# Patient Record
Sex: Male | Born: 1965
Health system: Southern US, Community
[De-identification: ages and names within clinical notes are randomized; demographics above are authoritative.]

## PROBLEM LIST (undated history)

## (undated) DIAGNOSIS — K2270 Barrett's esophagus without dysplasia: Secondary | ICD-10-CM

## (undated) DIAGNOSIS — M542 Cervicalgia: Secondary | ICD-10-CM

## (undated) DIAGNOSIS — F419 Anxiety disorder, unspecified: Secondary | ICD-10-CM

## (undated) DIAGNOSIS — E785 Hyperlipidemia, unspecified: Secondary | ICD-10-CM

## (undated) DIAGNOSIS — R002 Palpitations: Secondary | ICD-10-CM

## (undated) DIAGNOSIS — E039 Hypothyroidism, unspecified: Secondary | ICD-10-CM

## (undated) DIAGNOSIS — D3502 Benign neoplasm of left adrenal gland: Secondary | ICD-10-CM

## (undated) DIAGNOSIS — M069 Rheumatoid arthritis, unspecified: Secondary | ICD-10-CM

## (undated) DIAGNOSIS — N200 Calculus of kidney: Secondary | ICD-10-CM

## (undated) DIAGNOSIS — T7840XA Allergy, unspecified, initial encounter: Secondary | ICD-10-CM

## (undated) DIAGNOSIS — E038 Other specified hypothyroidism: Secondary | ICD-10-CM

## (undated) DIAGNOSIS — R0789 Other chest pain: Secondary | ICD-10-CM

## (undated) DIAGNOSIS — K219 Gastro-esophageal reflux disease without esophagitis: Secondary | ICD-10-CM

## (undated) HISTORY — DX: Rheumatoid arthritis, unspecified: M06.9

## (undated) HISTORY — DX: Benign neoplasm of left adrenal gland: D35.02

## (undated) HISTORY — DX: Hypothyroidism, unspecified: E03.9

## (undated) HISTORY — DX: Palpitations: R00.2

## (undated) HISTORY — DX: Hyperlipidemia, unspecified: E78.5

## (undated) HISTORY — DX: Other chest pain: R07.89

## (undated) HISTORY — DX: Allergy, unspecified, initial encounter: T78.40XA

## (undated) HISTORY — DX: Other specified hypothyroidism: E03.8

## (undated) HISTORY — DX: Anxiety disorder, unspecified: F41.9

## (undated) HISTORY — DX: Calculus of kidney: N20.0

## (undated) HISTORY — DX: Barrett's esophagus without dysplasia: K22.70

## (undated) HISTORY — DX: Gastro-esophageal reflux disease without esophagitis: K21.9

## (undated) HISTORY — PX: CARDIOVASCULAR STRESS TEST: SHX262

## (undated) HISTORY — DX: Cervicalgia: M54.2

---

## 1998-03-15 ENCOUNTER — Emergency Department (HOSPITAL_COMMUNITY): Admission: EM | Admit: 1998-03-15 | Discharge: 1998-03-15 | Payer: Self-pay | Admitting: Emergency Medicine

## 2005-04-13 ENCOUNTER — Ambulatory Visit: Payer: Self-pay | Admitting: Internal Medicine

## 2009-03-11 ENCOUNTER — Encounter: Admission: RE | Admit: 2009-03-11 | Discharge: 2009-03-11 | Payer: Self-pay | Admitting: Neurological Surgery

## 2009-10-16 ENCOUNTER — Encounter: Admission: RE | Admit: 2009-10-16 | Discharge: 2009-10-16 | Payer: Self-pay | Admitting: Neurological Surgery

## 2010-11-02 DIAGNOSIS — K2270 Barrett's esophagus without dysplasia: Secondary | ICD-10-CM

## 2010-11-02 HISTORY — DX: Barrett's esophagus without dysplasia: K22.70

## 2011-08-11 ENCOUNTER — Telehealth: Payer: Self-pay | Admitting: Internal Medicine

## 2011-08-11 NOTE — Telephone Encounter (Signed)
Pt's only visit was 04/13/2005 for burning of throat GERD? lmom for pt to call back.

## 2011-08-11 NOTE — Telephone Encounter (Signed)
Dr Juanda Chance, chart is on your desk for review; OV or Direct EGG? Thanks.

## 2011-08-11 NOTE — Telephone Encounter (Signed)
Pt reports pain at the bottom of his sternum; he stated Dr Juanda Chance stated this was a bone- Xiphoid Process? He reports he is thin and has poor posture and sometimes while he eats he feels as though something is pushing in on his stomach. He take Omeprazole and knows it helps because if he doesn't take it for a few days, he has terrible heartburn. He also feels as though he has a knot in his throat, like stuck food- no pain or trouble eating, he just knows it's there. Pt stated Dr Juanda Chance wanted to do an ENDO, but he didn't do it. Pt would like Dr Regino Schultze input on whether he needs an OV or EGD. Explained to pt Dr Juanda Chance is out of [place] country and I will get the chart and have her review it. If he hasn't heard from [place] by 08/24/11, call back for an update; pt stated understanding.

## 2011-08-11 NOTE — Telephone Encounter (Signed)
OK to schedule direct EGD if he is not on anticoagulants or on dialysis.

## 2011-08-12 NOTE — Telephone Encounter (Signed)
Informed pt it's ok to schedule a Direct EGD if he isn't on dialysis or anti-coags. Pt stated he is only on OTC meds. Pre Visit schedule 08/18/11 at 8am; EGD 09/01/11 at 4pm. Pt stated understanding.

## 2011-08-18 ENCOUNTER — Encounter: Payer: Self-pay | Admitting: Internal Medicine

## 2011-08-18 ENCOUNTER — Ambulatory Visit (AMBULATORY_SURGERY_CENTER): Payer: BC Managed Care – PPO

## 2011-08-18 DIAGNOSIS — R12 Heartburn: Secondary | ICD-10-CM

## 2011-08-18 DIAGNOSIS — R1013 Epigastric pain: Secondary | ICD-10-CM

## 2011-08-18 DIAGNOSIS — R1310 Dysphagia, unspecified: Secondary | ICD-10-CM

## 2011-08-31 ENCOUNTER — Telehealth: Payer: Self-pay | Admitting: Internal Medicine

## 2011-08-31 NOTE — Telephone Encounter (Signed)
Pt. Was concerned that since he had a cold and was coughing if we would still be able to do procedure. Pt afebrile. Instructed pt. That procedure will still be done as long as he feels up to it and he is not having fevers.

## 2011-09-01 ENCOUNTER — Encounter: Payer: Self-pay | Admitting: Internal Medicine

## 2011-09-01 ENCOUNTER — Ambulatory Visit (AMBULATORY_SURGERY_CENTER): Payer: BC Managed Care – PPO | Admitting: Internal Medicine

## 2011-09-01 DIAGNOSIS — R1310 Dysphagia, unspecified: Secondary | ICD-10-CM

## 2011-09-01 DIAGNOSIS — K2270 Barrett's esophagus without dysplasia: Secondary | ICD-10-CM

## 2011-09-01 DIAGNOSIS — K219 Gastro-esophageal reflux disease without esophagitis: Secondary | ICD-10-CM

## 2011-09-01 DIAGNOSIS — R1013 Epigastric pain: Secondary | ICD-10-CM

## 2011-09-01 MED ORDER — OMEPRAZOLE MAGNESIUM 20 MG PO TBEC: 20.0000 mg | DELAYED_RELEASE_TABLET | Freq: Every day | ORAL | Status: DC

## 2011-09-01 MED ORDER — SODIUM CHLORIDE 0.9 % IV SOLN: 500.0000 mL | INTRAVENOUS | Status: DC

## 2011-09-01 NOTE — Patient Instructions (Signed)
FOLLOW YOUR DISCHARGE INSTRUCTIONS ON THE GREEN AND BLUE INSTRUCTION SHEETS.  CONTINUE YOUR MEDICATIONS. NEW PRESCRIPTION SENT ELECTRONICALLY TO YOUR HOSPITAL, PRILOSEC 20 MG DAILY, 30 MINUTES BEFORE BREAKFAST.  AWAIT BIOPSY RESULTS.

## 2011-09-02 ENCOUNTER — Telehealth: Payer: Self-pay | Admitting: *Deleted

## 2011-09-02 NOTE — Telephone Encounter (Signed)

## 2011-09-03 HISTORY — PX: ESOPHAGOGASTRODUODENOSCOPY: SHX1529

## 2011-09-07 ENCOUNTER — Encounter: Payer: Self-pay | Admitting: Internal Medicine

## 2011-09-21 ENCOUNTER — Encounter: Payer: Self-pay | Admitting: *Deleted

## 2011-09-28 ENCOUNTER — Encounter: Payer: Self-pay | Admitting: Family Medicine

## 2011-09-28 ENCOUNTER — Ambulatory Visit (INDEPENDENT_AMBULATORY_CARE_PROVIDER_SITE_OTHER): Payer: BC Managed Care – PPO | Admitting: Family Medicine

## 2011-09-28 ENCOUNTER — Telehealth: Payer: Self-pay | Admitting: Family Medicine

## 2011-09-28 VITALS — BP 125/82 | HR 69 | Temp 97.7°F | Wt 154.0 lb

## 2011-09-28 DIAGNOSIS — Z23 Encounter for immunization: Secondary | ICD-10-CM

## 2011-09-28 DIAGNOSIS — J019 Acute sinusitis, unspecified: Secondary | ICD-10-CM

## 2011-09-28 MED ORDER — AMOXICILLIN 875 MG PO TABS: 875.0000 mg | ORAL_TABLET | Freq: Two times a day (BID) | ORAL | Status: AC

## 2011-09-28 NOTE — Telephone Encounter (Signed)
Pls request records from [facility] in [place].  Thx.  

## 2011-09-28 NOTE — Progress Notes (Signed)
Office Note 10/03/2011  CC:  Chief Complaint  Patient presents with  . Establish Care    sinus infection    HPI:  Tyler Schmidt is a 45 y.o. White male who is here to establish care and discuss respiratory illness. Patient's most recent primary MD: Dr. Lenise Arena at [facility] in [place]. Old records were not reviewed prior to or during today's visit.  Pt presents complaining of respiratory symptoms for approx 20 days.  Mostly nasal congestion/runny nose, sneezing, and PND cough.  Worst symptoms seems to be the pressure in head, sinuses, upper teeth pain.  Lately the symptoms seem to be worsening. No fevers, no wheezing, and no SOB.  ST mild at most.  Symptoms made worse by night time.  Symptoms improved by upright position. Smoker? No (former; quit 1980) Recent sick contact? no Muscle or joint aches? No Flu vaccine yet?  No  ROS: no n/v/d or abdominal pain.  No rash.  No neck stiffness.   +Mild fatigue.  +Mild appetite loss.   Past Medical History  Diagnosis Date  . GERD (gastroesophageal reflux disease)   . Allergy   . Hyperlipidemia     HDL high, LDL high: simvastatin resulted in prolonged/severe muscle pain, led to long w/u and pt wants to avoid further statin unless lipids severely worsen  . Barrett esophagus 2012  No hx of recurrent sinusitis No hx of RAD/asthma  Past Surgical History  Procedure Date  . Esophagogastroduodenoscopy 09/2011    Dr. Juanda Chance  . 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 Deborah Chalk)    Family History  Problem Relation Age of Onset  . Heart disease Father   . Colon cancer Neg Hx     History   Social History  . Marital Status: Married    Spouse Name: N/A    Number of Children: N/A  . Years of Education: N/A   Occupational History  . Not on file.   Social History Main Topics  . Smoking status: Former Smoker    Types: Cigarettes    Quit date: 08/18/1979  . Smokeless tobacco: Former Neurosurgeon      Types: Chew   Comment: Pt uses on and off  . Alcohol Use: No  . Drug Use: No  . Sexually Active: Not on file   Other Topics Concern  . 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  Prudent diet.   MEDS CURRENTLY: omeprazole 20mg  qd, Fish oil, niacin, co-Q10, [facility] Outpatient Encounter Prescriptions as of 09/28/2011  Medication Sig Dispense Refill  . co-enzyme Q-10 30 MG capsule Take 100 mg by mouth daily. Take 2 100 mg daily       . Multiple Vitamin (ONE-A-DAY MENS PO) Take by mouth daily.        . niacin 500 MG tablet Take 500 mg by mouth daily with breakfast.        . Omega-3 Fatty Acids (FISH OIL) 1000 MG CPDR Take by mouth daily.        Marland Kitchen DISCONTD: omeprazole (PRILOSEC OTC) 20 MG tablet Take 1 tablet (20 mg total) by mouth daily.  30 tablet  3  . amoxicillin (AMOXIL) 875 MG tablet Take 1 tablet (875 mg total) by mouth 2 (two) times daily.  20 tablet  0    No Known Allergies  ROS Review of Systems  Constitutional:  Negative for fever and fatigue.  HENT:       See HPI  Eyes: Negative for visual disturbance.  Respiratory:       See HPI  Cardiovascular: Negative for chest pain.  Gastrointestinal: Negative for nausea and abdominal pain.  Genitourinary: Negative for dysuria.  Musculoskeletal: Negative for back pain and joint swelling.  Skin: Negative for rash.  Neurological: Negative for dizziness and weakness.  Hematological: Negative for adenopathy.  Psychiatric/Behavioral: Negative for dysphoric mood.    PE; Blood pressure 125/82, pulse 69, temperature 97.7 F (36.5 C), temperature source Oral, weight 154 lb (69.854 kg), SpO2 97.00%. VS: noted--normal. Gen: alert, NAD, NONTOXIC APPEARING. HEENT: eyes without injection, drainage, or swelling.  Ears: EACs clear, TMs with normal light reflex and landmarks.  Nose: Clear rhinorrhea, with some dried,  crusty exudate adherent to mildly injected mucosa.  No purulent d/c.  No paranasal sinus TTP.  No facial swelling.  Throat and mouth without focal lesion.  No pharyngial swelling, erythema, or exudate.   Neck: supple, no LAD.   LUNGS: CTA bilat, nonlabored resps.   CV: RRR, no m/r/g. EXT: no c/c/e SKIN: no rash   Pertinent labs:  none  ASSESSMENT AND PLAN:   Sinusitis acute Amoxil 875mg  bid x 10d. Nasonex sample given, 2 sprays each nostril qd. Continue claritin D. Gave flu vaccine today IM.     Return if symptoms worsen or fail to improve.

## 2011-09-29 NOTE — Telephone Encounter (Signed)
Faxed 09/28/11

## 2011-09-30 ENCOUNTER — Encounter: Payer: Self-pay | Admitting: Internal Medicine

## 2011-09-30 ENCOUNTER — Telehealth: Payer: Self-pay | Admitting: Internal Medicine

## 2011-09-30 ENCOUNTER — Ambulatory Visit (INDEPENDENT_AMBULATORY_CARE_PROVIDER_SITE_OTHER): Payer: BC Managed Care – PPO | Admitting: Internal Medicine

## 2011-09-30 VITALS — BP 106/70 | HR 84 | Ht 73.0 in | Wt 153.8 lb

## 2011-09-30 DIAGNOSIS — K2270 Barrett's esophagus without dysplasia: Secondary | ICD-10-CM

## 2011-09-30 MED ORDER — OMEPRAZOLE MAGNESIUM 20 MG PO TBEC: 20.0000 mg | DELAYED_RELEASE_TABLET | Freq: Two times a day (BID) | ORAL | Status: DC

## 2011-09-30 NOTE — Progress Notes (Signed)
Tyler Schmidt 1966/10/01 MRN [id number]    History of Present Illness:  This is a 45 year old white male on Prilosec who was just diagnosed with Barrett's esophagus on an upper endoscopy 09/01/2011. He has had a long history of gastroesophageal reflux for which he took Prilosec 20 mg daily. He was experiencing  discomfort and burning in his throat for which we preformed an upper endoscopy. It showed an essentially normal esophagus, stomach and duodenum but the biopsies of the GE junction showed intestinal metaplasia and goblet cells.   Past Medical History  Diagnosis Date  . GERD (gastroesophageal reflux disease)   . Allergy   . Hyperlipidemia   . Barrett esophagus 2012   Past Surgical History  Procedure Date  . Esophagogastroduodenoscopy 09/2011    Dr. Juanda Chance    reports that he quit smoking about 32 years ago. His smoking use included Cigarettes. He has quit using smokeless tobacco. His smokeless tobacco use included Chew. He reports that he does not drink alcohol or use illicit drugs. family history includes Heart disease in his father.  There is no history of Colon cancer. No Known Allergies      Review of Systems: Denies dysphagia chest pain shortness of breath  The remainder of the 10 point ROS is negative except as outlined in H&P    Assessment and Plan:  Problem #1 Chronic gastroesophageal reflux disease with a recent diagnosis of Barrett's esophagus. We will increase his Prilosec to 20 mg twice a day and follow antireflux measures. He will have a repeat upper endoscopy in 2 years. He would like to have a 90 day supply of his Prilosec.   09/30/2011 Lina Sar

## 2011-09-30 NOTE — Telephone Encounter (Signed)
Advised that 20 mg capsules would be okay to use (insurance will not cover otc medicine.)

## 2011-09-30 NOTE — Patient Instructions (Addendum)
We have sent the following medications to your pharmacy for you to pick up at your convenience: Prilosec 20 twice daily You will be due for a recall endoscopy in 08/2013. We will send you a reminder in the mail when it gets closer to that time. DR Silvestre Moment

## 2011-10-03 ENCOUNTER — Encounter: Payer: Self-pay | Admitting: Family Medicine

## 2011-10-03 DIAGNOSIS — J019 Acute sinusitis, unspecified: Secondary | ICD-10-CM | POA: Insufficient documentation

## 2011-10-03 NOTE — Assessment & Plan Note (Signed)
Amoxil 875mg  bid x 10d. Nasonex sample given, 2 sprays each nostril qd. Continue claritin D. Gave flu vaccine today IM.

## 2011-10-25 ENCOUNTER — Encounter: Payer: Self-pay | Admitting: Family Medicine

## 2011-11-03 ENCOUNTER — Encounter: Payer: Self-pay | Admitting: Family Medicine

## 2011-12-18 ENCOUNTER — Encounter: Payer: Self-pay | Admitting: Family Medicine

## 2011-12-18 ENCOUNTER — Ambulatory Visit (INDEPENDENT_AMBULATORY_CARE_PROVIDER_SITE_OTHER): Payer: BC Managed Care – PPO | Admitting: Family Medicine

## 2011-12-18 VITALS — BP 132/76 | HR 69 | Temp 98.4°F | Wt 153.0 lb

## 2011-12-18 DIAGNOSIS — R221 Localized swelling, mass and lump, neck: Secondary | ICD-10-CM

## 2011-12-18 DIAGNOSIS — R220 Localized swelling, mass and lump, head: Secondary | ICD-10-CM

## 2011-12-18 MED ORDER — CEPHALEXIN 500 MG PO CAPS: 500.0000 mg | ORAL_CAPSULE | Freq: Three times a day (TID) | ORAL | Status: AC

## 2011-12-18 NOTE — Progress Notes (Signed)
OFFICE NOTE  12/24/2011  CC:  Chief Complaint  Patient presents with  . Cyst    on left side of neck     HPI: Patient is a 46 y.o. Caucasian male who is here for cyst on left side of neck. For >10 yrs he had noted a swelling on left posterior cervical region of his neck, has always been soft and for the most part unchanged in size.  Over the last week he has noted a slight increase in size and a dull ache in the area.  Tingling sensation in left external ear anatomy and temple during this time as well.     Pertinent PMH:  Past Medical History  Diagnosis Date  . GERD (gastroesophageal reflux disease)   . Allergy   . Hyperlipidemia     HDL high, LDL high: simvastatin resulted in prolonged/severe muscle pain, led to long w/u and pt wants to avoid further statin unless lipids severely worsen  . Barrett esophagus 2012  . Subclinical hypothyroidism     Latest labs 01/2011 (TSH about 5.5, T3 and T4 wnl)   Past surgical, social, and family history reviewed and no changes noted since last office visit. MEDS:  Outpatient Prescriptions Prior to Visit  Medication Sig Dispense Refill  . co-enzyme Q-10 30 MG capsule Take 100 mg by mouth daily. Take 2 100 mg daily       . Multiple Vitamin (ONE-A-DAY MENS PO) Take by mouth daily.        . niacin 500 MG tablet Take 500 mg by mouth daily with breakfast.        . Omega-3 Fatty Acids (FISH OIL) 1000 MG CPDR Take by mouth daily.        Marland Kitchen omeprazole (PRILOSEC OTC) 20 MG tablet Take 1 tablet (20 mg total) by mouth 2 (two) times daily.  180 tablet  2    PE: Blood pressure 132/76, pulse 69, temperature 98.4 F (36.9 C), temperature source Temporal, weight 153 lb (69.4 kg). Gen: Alert, well appearing.  Patient is oriented to person, place, time, and situation. ENT: PERRLA, EOMI, Temples without tenderness or sensation abnormality to palpation.  External ears without erythema or skin changes or warmth.  EAr canals and TMs normal. Left side of neck,  posterior to left SCM muscle bundle shows a barely discernable swelling upon inspection, mainly when he turns head to the right.  Palpation of the area reveals a soft, minimally tender mass that is nonerythematous measuring approx 2.5 cm craniocaudal and 2.0 cm front to back.  The left posterior neck area is mildly nontender diffusely but without any other palpable masses/  IMPRESSION AND PLAN:  Mass of left side of neck Chronic with mld acute worsening. Unclear etiology: ddx is solitary lymph node (possibly infected), cyst (epidermal inclusion vs brachial cleft cyst), neoplasm (lipoma vs lymphoma). With no worrisome signs of malignancy at this time, will treat empirically for infection with keflex 500mg  tid x 10d and see him back for recheck in 2 wks.  If not improved then will proceed with u/s of the area to help determine cystic vs solid nature. Pt's questions were answered and he expressed understanding of the plan.      FOLLOW UP: 2 wks

## 2011-12-24 NOTE — Assessment & Plan Note (Addendum)
Chronic with mld acute worsening. Unclear etiology: ddx is solitary lymph node (possibly infected), cyst (epidermal inclusion vs brachial cleft cyst), neoplasm (lipoma vs lymphoma). With no worrisome signs of malignancy at this time, will treat empirically for infection with keflex 500mg  tid x 10d and see him back for recheck in 2 wks.  If not improved then will proceed with u/s of the area to help determine cystic vs solid nature. Pt's questions were answered and he expressed understanding of the plan.

## 2012-01-04 ENCOUNTER — Ambulatory Visit: Payer: BC Managed Care – PPO | Admitting: Family Medicine

## 2012-01-11 ENCOUNTER — Encounter: Payer: Self-pay | Admitting: Family Medicine

## 2012-01-11 ENCOUNTER — Ambulatory Visit (INDEPENDENT_AMBULATORY_CARE_PROVIDER_SITE_OTHER): Payer: BC Managed Care – PPO | Admitting: Family Medicine

## 2012-01-11 DIAGNOSIS — J019 Acute sinusitis, unspecified: Secondary | ICD-10-CM

## 2012-01-11 DIAGNOSIS — R221 Localized swelling, mass and lump, neck: Secondary | ICD-10-CM

## 2012-01-11 DIAGNOSIS — R220 Localized swelling, mass and lump, head: Secondary | ICD-10-CM

## 2012-01-11 MED ORDER — FLUTICASONE PROPIONATE 50 MCG/ACT NA SUSP: 2.0000 | Freq: Every day | NASAL | Status: DC

## 2012-01-11 NOTE — Assessment & Plan Note (Signed)
Resolved with a course of keflex. He'll start his daily allergic rhinitis tx since spring is coming on: flonase qd rx'd and he'll pick up off-brand allegra to take qd prn.

## 2012-01-11 NOTE — Progress Notes (Signed)
OFFICE NOTE  01/11/2012  CC:  Chief Complaint  Patient presents with  . Follow-up    mass on left side of neck     HPI: Patient is a 46 y.o. Caucasian male who is here for 2 1/2 wk f/u of sinusitis and question of acute change in longstanding left sided neck mass (suspicious for lipoma vs cyst).  He says all sx's resolved after about 3-4 d of abx.  He did take the full course of keflex. No complaints, asks about getting on his allergic rhinitis meds regularly now since it is near spring,his worst season.   Pertinent PMH:  Past Medical History  Diagnosis Date  . GERD (gastroesophageal reflux disease)   . Allergy   . Hyperlipidemia     HDL high, LDL high: simvastatin resulted in prolonged/severe muscle pain, led to long w/u and pt wants to avoid further statin unless lipids severely worsen  . Barrett esophagus 2012  . Subclinical hypothyroidism     Latest labs 01/2011 (TSH about 5.5, T3 and T4 wnl)    MEDS:  Outpatient Prescriptions Prior to Visit  Medication Sig Dispense Refill  . co-enzyme Q-10 30 MG capsule Take 100 mg by mouth daily. Take 2 100 mg daily       . Multiple Vitamin (ONE-A-DAY MENS PO) Take by mouth daily.        . niacin 500 MG tablet Take 500 mg by mouth daily with breakfast.        . Omega-3 Fatty Acids (FISH OIL) 1000 MG CPDR Take by mouth daily.        Marland Kitchen omeprazole (PRILOSEC OTC) 20 MG tablet Take 1 tablet (20 mg total) by mouth 2 (two) times daily.  180 tablet  2    PE: Blood pressure 115/72, pulse 65, temperature 98 F (36.7 C), temperature source Temporal, height 6\' 1"  (1.854 m), weight 156 lb (70.761 kg). Gen: Alert, well appearing.  Patient is oriented to person, place, time, and situation. NECK: left side just posterior to SCM muscle there is a 2-3 cm soft, fatty-feeling sub Q swelling without tenderness or overlying erythema.  Not fixed to surrounding tissues, no induration.  IMPRESSION AND PLAN: Mass of left side of neck Benign features:  continue with watchful waiting approach. Feels better with recent abx.  Sinusitis acute Resolved with a course of keflex. He'll start his daily allergic rhinitis tx since spring is coming on: flonase qd rx'd and he'll pick up off-brand allegra to take qd prn.      FOLLOW UP: prn

## 2012-01-11 NOTE — Assessment & Plan Note (Signed)
Benign features: continue with watchful waiting approach. Feels better with recent abx.

## 2012-03-02 ENCOUNTER — Encounter: Payer: Self-pay | Admitting: Family Medicine

## 2012-03-02 ENCOUNTER — Ambulatory Visit (INDEPENDENT_AMBULATORY_CARE_PROVIDER_SITE_OTHER): Payer: BC Managed Care – PPO | Admitting: Family Medicine

## 2012-03-02 VITALS — BP 124/77 | HR 78 | Temp 97.8°F | Ht 73.0 in | Wt 154.0 lb

## 2012-03-02 DIAGNOSIS — J209 Acute bronchitis, unspecified: Secondary | ICD-10-CM

## 2012-03-02 DIAGNOSIS — E038 Other specified hypothyroidism: Secondary | ICD-10-CM | POA: Insufficient documentation

## 2012-03-02 DIAGNOSIS — E039 Hypothyroidism, unspecified: Secondary | ICD-10-CM | POA: Insufficient documentation

## 2012-03-02 NOTE — Assessment & Plan Note (Signed)
His latest labs are essentially unchanged (01/16/12). Reassured that this is something we'll simply continue to monitor clinically and with labs. Plan to start synthroid IF he becomes clearly symptomatic or if TSH gets to 10 or greater.

## 2012-03-02 NOTE — Assessment & Plan Note (Signed)
Self-limited nature of this illness was discussed, questions answered.  Discussed symptomatic care, rest, fluids.   Warning signs/symptoms of worsening illness were discussed.  Patient instructed to call or return if any of these occur. If not improving any in 5 more days, call or return.

## 2012-03-02 NOTE — Progress Notes (Signed)
OFFICE NOTE  03/02/2012  CC:  Chief Complaint  Patient presents with  . discuss [facility] results    TSH  . head and chest congestion    X 5 days, w/ cough and phlegm (green) in the am and at night it seems to get tight     HPI: Patient is a 46 y.o. Caucasian male who is here for discussion of recent labs obtained through his employer as part of a "peak health" evaluation, plus he has a recent respiratory illness.  Routine lab monitoring 01/16/12 showed no significant change in his mild subclinical hypothyroidism and he remains asymptomatic.  His cholesterol panel is normal for his level of CV risk.  Pt presents complaining of respiratory symptoms for 5 days.  Primary symptoms are: initially nasal congest/runny nose, lately more cough, chest tight.  No wheezing, no SOB.  Subjective fever first day or so, nothing since.  Worst symptoms seems to be the coughing lately -"it's gone to my chest".  Lately the symptoms seem to be staying stable.  Pertinent negatives:No pain in face or teeth.  No significant HA.  ST mild at most--painful when coughing.  Symptoms made worse by nothing.  Symptoms improved by nighttime cold med OTC. Smoker? no Muscle or joint aches? no  Additional ROS: no n/v/d or abdominal pain.  No rash.  No neck stiffness.   +Mild fatigue.  +Mild appetite loss.   Pertinent PMH:  Past Medical History  Diagnosis Date  . GERD (gastroesophageal reflux disease)   . Allergy   . Hyperlipidemia     HDL high, LDL high: simvastatin resulted in prolonged/severe muscle pain, led to long w/u and pt wants to avoid further statin unless lipids severely worsen  . Barrett esophagus 2012  . Subclinical hypothyroidism     Latest labs 01/2011 (TSH about 5.5, T3 and T4 wnl)    MEDS:  Outpatient Prescriptions Prior to Visit  Medication Sig Dispense Refill  . co-enzyme Q-10 30 MG capsule Take 100 mg by mouth daily. Take 2 100 mg daily       . fluticasone (FLONASE) 50 MCG/ACT nasal spray  Place 2 sprays into the nose daily.  16 g  10  . Multiple Vitamin (ONE-A-DAY MENS PO) Take by mouth daily.        . niacin 500 MG tablet Take 500 mg by mouth daily with breakfast.        . Omega-3 Fatty Acids (FISH OIL) 1000 MG CPDR Take by mouth daily.        Marland Kitchen omeprazole (PRILOSEC OTC) 20 MG tablet Take 1 tablet (20 mg total) by mouth 2 (two) times daily.  180 tablet  2    PE: Blood pressure 124/77, pulse 78, temperature 97.8 F (36.6 C), temperature source Temporal, height 6\' 1"  (1.854 m), weight 154 lb (69.854 kg), SpO2 98.00%. VS: noted--normal. Gen: alert, NAD, NONTOXIC APPEARING. HEENT: eyes without injection, drainage, or swelling.  Ears: EACs clear, TMs with normal light reflex and landmarks.  Nose: Clear rhinorrhea, with some dried, crusty exudate adherent to mildly injected mucosa.  No purulent d/c.  No paranasal sinus TTP.  No facial swelling.  Throat and mouth without focal lesion.  No pharyngial swelling, erythema, or exudate.   Neck: supple, no LAD.   LUNGS: CTA bilat, nonlabored resps.   CV: RRR, no m/r/g. EXT: no c/c/e SKIN: no rash    IMPRESSION AND PLAN:  Acute bronchitis Self-limited nature of this illness was discussed, questions answered.  Discussed symptomatic  care, rest, fluids.   Warning signs/symptoms of worsening illness were discussed.  Patient instructed to call or return if any of these occur. If not improving any in 5 more days, call or return.   Subclinical hypothyroidism His latest labs are essentially unchanged (01/16/12). Reassured that this is something we'll simply continue to monitor clinically and with labs. Plan to start synthroid IF he becomes clearly symptomatic or if TSH gets to 10 or greater.      FOLLOW UP: prn

## 2012-07-11 ENCOUNTER — Telehealth: Payer: Self-pay | Admitting: Emergency Medicine

## 2012-07-11 NOTE — Telephone Encounter (Signed)
Dr. Milinda Cave, do you know what testing patient is asking about or do I need to call him for more information?

## 2012-07-12 NOTE — Telephone Encounter (Signed)
Tell him I don't recommend he get this done.  It is a Pharmacologist and the information obtained from it could not only be unreliable, but could lead to unnecessary further testing and worry.  --thx

## 2012-07-12 NOTE — Telephone Encounter (Signed)
Website is [email or web address]  Ultrasound scan for CV disease.  Pt wants to know if it would be any benefit to him.  Unsure if results would be reliable.

## 2012-07-12 NOTE — Telephone Encounter (Signed)
Looking throught the last notes in chart, the only thing I see that he could need as far as labs go is thyroid blood tests to f/u his subclinical hypothyroidism.  Pls call him and get more specifics from him.-thx

## 2012-07-20 ENCOUNTER — Encounter: Payer: Self-pay | Admitting: Family Medicine

## 2012-07-20 ENCOUNTER — Ambulatory Visit (INDEPENDENT_AMBULATORY_CARE_PROVIDER_SITE_OTHER): Payer: BC Managed Care – PPO | Admitting: Family Medicine

## 2012-07-20 VITALS — BP 145/74 | HR 69 | Ht 73.0 in | Wt 151.0 lb

## 2012-07-20 DIAGNOSIS — Z23 Encounter for immunization: Secondary | ICD-10-CM

## 2012-07-20 DIAGNOSIS — R0789 Other chest pain: Secondary | ICD-10-CM | POA: Insufficient documentation

## 2012-07-20 DIAGNOSIS — R079 Chest pain, unspecified: Secondary | ICD-10-CM

## 2012-07-20 DIAGNOSIS — E039 Hypothyroidism, unspecified: Secondary | ICD-10-CM

## 2012-07-20 DIAGNOSIS — E038 Other specified hypothyroidism: Secondary | ICD-10-CM

## 2012-07-20 LAB — CBC WITH DIFFERENTIAL/PLATELET
Basophils Absolute: 0 10*3/uL (ref 0.0–0.1)
HCT: 43.1 % (ref 39.0–52.0)
Lymphs Abs: 2.1 10*3/uL (ref 0.7–4.0)
MCV: 88.1 fl (ref 78.0–100.0)
Monocytes Absolute: 0.4 10*3/uL (ref 0.1–1.0)
Neutrophils Relative %: 52.2 % (ref 43.0–77.0)
Platelets: 233 10*3/uL (ref 150.0–400.0)
RDW: 13.5 % (ref 11.5–14.6)

## 2012-07-20 LAB — T4, FREE: Free T4: 0.9 ng/dL (ref 0.60–1.60)

## 2012-07-20 LAB — TSH: TSH: 2.63 u[IU]/mL (ref 0.35–5.50)

## 2012-07-20 LAB — COMPREHENSIVE METABOLIC PANEL
ALT: 18 U/L (ref 0–53)
AST: 17 U/L (ref 0–37)
Alkaline Phosphatase: 39 U/L (ref 39–117)
Creatinine, Ser: 1.1 mg/dL (ref 0.4–1.5)
Total Bilirubin: 1.1 mg/dL (ref 0.3–1.2)

## 2012-07-20 NOTE — Progress Notes (Signed)
OFFICE NOTE  07/20/2012  CC:  Chief Complaint  Patient presents with  . Shoulder Pain    tinngling in left hand 3 days ago; dull chest pressure     HPI: Patient is a 46 y.o. Caucasian male who is here for left shoulder and hand complaints, chest pressure some lately.  Describes a couple of weeks of episodic dull central chest pressure, 2/10 intensity, on one occasion his left arm hurt some and left hand tingled a bit.  The chest pressure lasts hours and is NOT exercise/activity induced.  He is active: jumps on trampoline and plays kickball and has NO PROBLEMS.  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  ROS: chest congestion with cough lately, productive of slight clear sputum.  No prolonged immobilization, no FH of blood clot, no personal hx of thrombosis.  Has cardiologist appt (already set up as routine f/u) 08/11/2012.  Pertinent PMH:  Past Medical History  Diagnosis Date  . GERD (gastroesophageal reflux disease)   . Allergy   . Hyperlipidemia     HDL high, LDL high: simvastatin resulted in prolonged/severe muscle pain, led to long w/u and pt wants to avoid further statin unless lipids severely worsen  . Barrett esophagus 2012  . Subclinical hypothyroidism     Latest labs 01/2011 (TSH about 5.5, T3 and T4 wnl)    MEDS:  Outpatient Prescriptions Prior to Visit  Medication Sig Dispense Refill  . co-enzyme Q-10 30 MG capsule Take 100 mg by mouth daily. Take 2 100 mg daily       . fluticasone (FLONASE) 50 MCG/ACT nasal spray Place 2 sprays into the nose daily.  16 g  10  . Multiple Vitamin (ONE-A-DAY MENS PO) Take by mouth daily.        . niacin 500 MG tablet Take 500 mg by mouth daily with breakfast.        . Omega-3 Fatty Acids (FISH OIL) 1000 MG CPDR Take by mouth daily.        Marland Kitchen omeprazole (PRILOSEC OTC) 20 MG tablet Take 1 tablet (20 mg total) by mouth 2  (two) times daily.  180 tablet  2    PE: Blood pressure 145/74, pulse 69, height 6\' 1"  (1.854 m), weight 151 lb (68.493 kg). Gen: Alert, well appearing.  Patient is oriented to person, place, time, and situation. AFFECT: pleasant, lucid thought and speech. ENT:   Eyes: no injection, icteris, swelling, or exudate.  EOMI, PERRLA. Nose: no drainage or turbinate edema/swelling.  No injection or focal lesion.  Mouth: lips without lesion/swelling.  Oral mucosa pink and moist.  Dentition intact and without obvious caries or gingival swelling.  Oropharynx without erythema, exudate, or swelling.  Neck - No masses or thyromegaly or limitation in range of motion CV: RRR, no murmur or rub.  Question of split S1 vs S4 gallop heard at apex.  LUNGS: CTA bilat, nonlabored resps, good aeration in all lung fields. ABD: soft, NT/ND EXT: no clubbing, cyanosis, or edema.    LABS: 12 lead EKG today: NSR, normal wave morphologies, normal intervals, normal voltages.  IMPRESSION AND PLAN:  Atypical chest pain Likely musculoskeletal, complicated by excessive life stresses. EKG today reassuring.  Will check D-dimer, CMET, CBC.  Reassured pt.  Encouraged him to keep his upcoming routine cardiology f/u appt 08/11/12.  Subclinical hypothyroidism Repeat thyroid labs today.   Flu shot given  IM today.  FOLLOW UP: 6 mo

## 2012-07-20 NOTE — Assessment & Plan Note (Signed)
Likely musculoskeletal, complicated by excessive life stresses. EKG today reassuring.  Will check D-dimer, CMET, CBC.  Reassured pt.  Encouraged him to keep his upcoming routine cardiology f/u appt 08/11/12.

## 2012-07-20 NOTE — Assessment & Plan Note (Signed)
Repeat thyroid labs today.

## 2012-07-20 NOTE — Telephone Encounter (Signed)
Notified pt.  He is agreeable. 

## 2012-08-02 HISTORY — PX: CARDIOVASCULAR STRESS TEST: SHX262

## 2012-08-11 ENCOUNTER — Ambulatory Visit: Payer: BC Managed Care – PPO | Admitting: Cardiovascular Disease

## 2012-08-12 ENCOUNTER — Telehealth: Payer: Self-pay | Admitting: Family Medicine

## 2012-08-12 ENCOUNTER — Encounter: Payer: Self-pay | Admitting: Family Medicine

## 2012-08-12 ENCOUNTER — Ambulatory Visit (INDEPENDENT_AMBULATORY_CARE_PROVIDER_SITE_OTHER): Payer: BC Managed Care – PPO | Admitting: Family Medicine

## 2012-08-12 VITALS — BP 138/93 | HR 82 | Temp 98.4°F | Ht 73.0 in | Wt 150.8 lb

## 2012-08-12 DIAGNOSIS — IMO0001 Reserved for inherently not codable concepts without codable children: Secondary | ICD-10-CM

## 2012-08-12 DIAGNOSIS — F419 Anxiety disorder, unspecified: Secondary | ICD-10-CM

## 2012-08-12 DIAGNOSIS — K219 Gastro-esophageal reflux disease without esophagitis: Secondary | ICD-10-CM

## 2012-08-12 DIAGNOSIS — F411 Generalized anxiety disorder: Secondary | ICD-10-CM

## 2012-08-12 DIAGNOSIS — R0789 Other chest pain: Secondary | ICD-10-CM

## 2012-08-12 DIAGNOSIS — R079 Chest pain, unspecified: Secondary | ICD-10-CM

## 2012-08-12 DIAGNOSIS — M542 Cervicalgia: Secondary | ICD-10-CM

## 2012-08-12 HISTORY — DX: Cervicalgia: M54.2

## 2012-08-12 HISTORY — DX: Anxiety disorder, unspecified: F41.9

## 2012-08-12 MED ORDER — CYCLOBENZAPRINE HCL 10 MG PO TABS: 10.0000 mg | ORAL_TABLET | Freq: Two times a day (BID) | ORAL | Status: DC | PRN

## 2012-08-12 MED ORDER — NITROGLYCERIN 0.4 MG SL SUBL: 0.4000 mg | SUBLINGUAL_TABLET | SUBLINGUAL | Status: DC | PRN

## 2012-08-12 MED ORDER — LORAZEPAM 0.5 MG PO TABS: 0.5000 mg | ORAL_TABLET | Freq: Two times a day (BID) | ORAL | Status: DC | PRN

## 2012-08-12 MED ORDER — MELOXICAM 15 MG PO TABS: 15.0000 mg | ORAL_TABLET | Freq: Every day | ORAL | Status: DC | PRN

## 2012-08-12 MED ORDER — SUCRALFATE 1 GM/10ML PO SUSP: 1.0000 g | Freq: Two times a day (BID) | ORAL | Status: DC

## 2012-08-12 MED ORDER — ASPIRIN EC 81 MG PO TBEC: 81.0000 mg | DELAYED_RELEASE_TABLET | Freq: Every day | ORAL | Status: DC

## 2012-08-12 NOTE — Telephone Encounter (Signed)
Caller: Tyler Schmidt/Patient; Patient Name: Tyler Schmidt; PCP: Earley Favor [facility]); Best Callback Phone Number: [phone]; Reason for call: Chest Pain/Chest Discomfort. Onset "Intermittent and sporadic" two months ago July 2013. He has an appointment with Cardiologist on October 31st.  Pain occurs with rest and exertion.  Not experiencing any discomfort now.  His last episode was yesterday.  Pain located in Left Chest and Left forearm , and substernal. Described as Dull aching pain, occasional short of breath and episode of dizziness.  Family history of - Cardiac disease.  AAoX3  . Emergent s/sx ruled out per Chest pain protocol with the exception of "One or more occurances of unexplained pain in shoulders neck or jaw in one or both arms, stomach lasting more that a few minutes that has not been evaluated" See provider within 24 housrs.  Appointment today 08/11/12 at 11:15 with Dr. Rogelia Rohrer at office . Home care guidelines and instrucitons reviewed. Caller expressed understanding. Will closely mointor s/sx.

## 2012-08-12 NOTE — Patient Instructions (Addendum)

## 2012-08-14 NOTE — Assessment & Plan Note (Signed)
Given Carafate to use qhs and avoid offending foods. Continue Omeprazole daily

## 2012-08-14 NOTE — Assessment & Plan Note (Signed)
Given a small amount of Lorazepam to use prn and he will return for futher consideration if he needs this frequently or stressors worsen

## 2012-08-14 NOTE — Assessment & Plan Note (Signed)
EKG essentially normal today, discussed case with cardiology today. They are going to work him in next week and he will seek immediate care over the weekend if his pain returns and does not resolve with NTG> He will start an 81 mg Aspirin daily. Avoid strenous activity over the weekend

## 2012-08-14 NOTE — Assessment & Plan Note (Signed)
Has been taking Ibuprofen 600 to 800 mg twice daily including right before bedtime. We will switch him to Meloxicam 15 mg in am with food and give a dose of Cyclobenzaprine qhs. Moist heat and gentle stretching.

## 2012-08-14 NOTE — Progress Notes (Signed)
Patient ID: Tyler Schmidt, male   DOB: 12-31-1965, 46 y.o.   MRN: [id number] Darrell Hauk [id number] [birth year] 08/14/2012      Progress Note-Follow Up  Subjective  Chief Complaint  Chief Complaint  Patient presents with  . chest discomfort    X 2 months off and on- tingling in left upper arm and left hand sometimes, pain sometimes in center of chest and upper left chest- dull pain    HPI  Patient is a 46 year old Caucasian male who is in today for further evaluation of recurrent atypical chest pain. He is scheduled to see cardiology later in the month but is getting more concerned about his episodes. He stands too tired from being a sudden event it is unclear if it was cardiac or perhaps a PE. There was some hemoptysis in the week leading up to his death. He was a smoker. He also has a paternal uncle who died at 72 of sudden cardiac death in 2 to tobacco. Paternal cousin had a cardiac event as well in his 33s. The patient himself is not a smoker. He's been under a great deal of stress lately and is doing a lot of heavy exercise at work. Does believe the pain in his chest and upper left chest wall that radiates down his arm may be musculoskeletal. He has never had shortness of breath, nausea, diaphoresis at the same time he said the chest pain. He does sometimes no palpitations pain he describes as a dull leg. He is having worsening of his reflux. He has been taking ibuprofen up to 800 mg twice daily and heartburn has flared despite his omeprazole use. He is getting more and more anxious and irritable with his family as well.  Past Medical History  Diagnosis Date  . GERD (gastroesophageal reflux disease)   . Allergy   . Hyperlipidemia     HDL high, LDL high: simvastatin resulted in prolonged/severe muscle pain, led to long w/u and pt wants to avoid further statin unless lipids severely worsen  . Barrett esophagus 2012  . Subclinical hypothyroidism     Latest labs 01/2011 (TSH about  5.5, T3 and T4 wnl)  . Neck pain 08/12/2012  . Anxiety 08/12/2012  . Reflux 08/12/2012    Past Surgical History  Procedure Date  . Esophagogastroduodenoscopy 09/2011    Dr. Juanda Chance  . 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 Deborah Chalk)    Family History  Problem Relation Age of Onset  . Heart disease Father     Died of MI age 59  . Colon cancer Neg Hx   . Heart disease Paternal Uncle     MI's in his 52s    History   Social History  . Marital Status: Married    Spouse Name: N/A    Number of Children: N/A  . Years of Education: N/A   Occupational History  . Not on file.   Social History Main Topics  . Smoking status: Former Smoker    Types: Cigarettes    Quit date: 08/18/1979  . Smokeless tobacco: Former Neurosurgeon    Types: Chew   Comment: Pt uses on and off  . Alcohol Use: No  . Drug Use: No  . Sexually Active: Not on file   Other Topics Concern  . 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  Prudent  diet.    Current Outpatient Prescriptions on File Prior to Visit  Medication Sig Dispense Refill  . co-enzyme Q-10 30 MG capsule Take 100 mg by mouth daily. Take 2 100 mg daily       . Multiple Vitamin (ONE-A-DAY MENS PO) Take by mouth daily.        . niacin 500 MG tablet Take 500 mg by mouth daily with breakfast.        . omeprazole (PRILOSEC) 20 MG capsule Take 1 capsule by mouth 2 (two) times daily.      . nitroGLYCERIN (NITROSTAT) 0.4 MG SL tablet Place 1 tablet (0.4 mg total) under the tongue every 5 (five) minutes as needed for chest pain. If no relief after 3 tabs to ER  25 tablet  3  . sucralfate (CARAFATE) 1 GM/10ML suspension Take 10 mLs (1 g total) by mouth 2 (two) times daily. Always at bedtime til pains resolve  420 mL  0    No Known Allergies  Review of Systems  Review of Systems    Constitutional: Negative for fever and malaise/fatigue.  HENT: Positive for neck pain. Negative for congestion.   Eyes: Negative for discharge.  Respiratory: Negative for shortness of breath.   Cardiovascular: Positive for chest pain. Negative for palpitations and leg swelling.  Gastrointestinal: Positive for heartburn. Negative for nausea, abdominal pain and diarrhea.  Genitourinary: Negative for dysuria.  Musculoskeletal: Negative for falls.  Skin: Negative for rash.  Neurological: Negative for loss of consciousness and headaches.  Endo/Heme/Allergies: Negative for polydipsia.  Psychiatric/Behavioral: Negative for depression and suicidal ideas. The patient is nervous/anxious. The patient does not have insomnia.     Objective  BP 138/93  Pulse 82  Temp 98.4 F (36.9 C) (Temporal)  Ht 6\' 1"  (1.854 m)  Wt 150 lb 12.8 oz (68.402 kg)  BMI 19.90 kg/m2  SpO2 97%  Physical Exam  Physical Exam  Constitutional: He is oriented to person, place, and time and well-developed, well-nourished, and in no distress. No distress.  HENT:  Head: Normocephalic and atraumatic.  Eyes: Conjunctivae normal are normal.  Neck: Neck supple. No thyromegaly present.  Cardiovascular: Normal rate, regular rhythm and normal heart sounds.   No murmur heard. Pulmonary/Chest: Effort normal and breath sounds normal. No respiratory distress.  Abdominal: He exhibits no distension and no mass. There is no tenderness.  Musculoskeletal: He exhibits no edema.  Neurological: He is alert and oriented to person, place, and time.  Skin: Skin is warm.  Psychiatric: Memory, affect and judgment normal.    Lab Results  Component Value Date   TSH 2.63 07/20/2012   Lab Results  Component Value Date   WBC 5.5 07/20/2012   HGB 14.3 07/20/2012   HCT 43.1 07/20/2012   MCV 88.1 07/20/2012   PLT 233.0 07/20/2012   Lab Results  Component Value Date   CREATININE 1.1 07/20/2012   BUN 20 07/20/2012   NA 139 07/20/2012   K 4.3  07/20/2012   CL 101 07/20/2012   CO2 30 07/20/2012   Lab Results  Component Value Date   ALT 18 07/20/2012   AST 17 07/20/2012   ALKPHOS 39 07/20/2012   BILITOT 1.1 07/20/2012    Assessment & Plan  Atypical chest pain EKG essentially normal today, discussed case with cardiology today. They are going to work him in next week and he will seek immediate care over the weekend if his pain returns and does not resolve with NTG> He will start an  81 mg Aspirin daily. Avoid strenous activity over the weekend  Neck pain Has been taking Ibuprofen 600 to 800 mg twice daily including right before bedtime. We will switch him to Meloxicam 15 mg in am with food and give a dose of Cyclobenzaprine qhs. Moist heat and gentle stretching.  Reflux Given Carafate to use qhs and avoid offending foods. Continue Omeprazole daily  Anxiety Given a small amount of Lorazepam to use prn and he will return for futher consideration if he needs this frequently or stressors worsen

## 2012-08-16 ENCOUNTER — Encounter: Payer: Self-pay | Admitting: Cardiology

## 2012-08-16 ENCOUNTER — Ambulatory Visit (INDEPENDENT_AMBULATORY_CARE_PROVIDER_SITE_OTHER): Payer: BC Managed Care – PPO | Admitting: Cardiology

## 2012-08-16 VITALS — BP 131/81 | HR 80 | Ht 73.0 in | Wt 149.0 lb

## 2012-08-16 DIAGNOSIS — E038 Other specified hypothyroidism: Secondary | ICD-10-CM

## 2012-08-16 DIAGNOSIS — E039 Hypothyroidism, unspecified: Secondary | ICD-10-CM

## 2012-08-16 DIAGNOSIS — R079 Chest pain, unspecified: Secondary | ICD-10-CM

## 2012-08-16 DIAGNOSIS — IMO0001 Reserved for inherently not codable concepts without codable children: Secondary | ICD-10-CM

## 2012-08-16 DIAGNOSIS — R0789 Other chest pain: Secondary | ICD-10-CM

## 2012-08-16 DIAGNOSIS — K219 Gastro-esophageal reflux disease without esophagitis: Secondary | ICD-10-CM

## 2012-08-16 NOTE — Assessment & Plan Note (Signed)
Continue Prilosec

## 2012-08-16 NOTE — Patient Instructions (Addendum)
Your physician recommends that you schedule a follow-up appointment in: AS NEEDED PENDING TEST RESULTS  Your physician has requested that you have a stress echocardiogram. For further information please visit [email or web address]. Please follow instruction sheet as given.    

## 2012-08-16 NOTE — Assessment & Plan Note (Signed)
Management per primary care. 

## 2012-08-16 NOTE — Assessment & Plan Note (Signed)
Symptoms are atypical. Plan stress echocardiogram for risk stratification. Note recent d-dimer normal.

## 2012-08-16 NOTE — Progress Notes (Signed)
HPI: 46 year old male for evaluation of chest pain. Patient had a stress echocardiogram in April of 2009 that was normal. Recent DDimer 07/20/12 normal. Hgb, renal function normal. Patient describes intermittent chest pain for approximately 2 months. The pain is in the upper substernal area. It lasts several minutes to 2 hours. No associated symptoms. The pain is not pleuritic, positional, related to food or exertional. It resolves spontaneously. It typically does not radiate although he had one episode radiating to his neck. Because of the above we were asked to evaluate. There is dyspnea with more extreme activities but not routine activities. No orthopnea, PND, pedal edema or exertional chest pain.  Current Outpatient Prescriptions  Medication Sig Dispense Refill  . aspirin EC 81 MG tablet Take 1 tablet (81 mg total) by mouth daily.      Marland Kitchen co-enzyme Q-10 30 MG capsule Take 100 mg by mouth daily. Take 2 100 mg daily       . cyclobenzaprine (FLEXERIL) 10 MG tablet Take 1 tablet (10 mg total) by mouth 2 (two) times daily as needed for muscle spasms (can cause sedation, use at bedtime).  30 tablet  0  . LORazepam (ATIVAN) 0.5 MG tablet Take 1 tablet (0.5 mg total) by mouth 2 (two) times daily as needed for anxiety (may cause sedation).  30 tablet  0  . meloxicam (MOBIC) 15 MG tablet Take 1 tablet (15 mg total) by mouth daily as needed for pain (in am with food).  30 tablet  1  . Multiple Vitamin (ONE-A-DAY MENS PO) Take by mouth daily.        . niacin 500 MG tablet Take 500 mg by mouth daily with breakfast.        . nitroGLYCERIN (NITROSTAT) 0.4 MG SL tablet Place 1 tablet (0.4 mg total) under the tongue every 5 (five) minutes as needed for chest pain. If no relief after 3 tabs to ER  25 tablet  3  . omeprazole (PRILOSEC) 20 MG capsule Take 1 capsule by mouth 2 (two) times daily.        No Known Allergies  Past Medical History  Diagnosis Date  . GERD (gastroesophageal reflux disease)   . Allergy    . Hyperlipidemia     HDL high, LDL high: simvastatin resulted in prolonged/severe muscle pain, led to long w/u and pt wants to avoid further statin unless lipids severely worsen  . Barrett esophagus 2012  . Subclinical hypothyroidism     Latest labs 01/2011 (TSH about 5.5, T3 and T4 wnl)  . Anxiety 08/12/2012    Past Surgical History  Procedure Date  . Esophagogastroduodenoscopy 09/2011    Dr. Juanda Chance  . 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 Marital Status: Married    Spouse Name: N/A    Number of Children: 2  . Years of Education: N/A   Occupational History  .      Pilot   Social History Main Topics  . Smoking status: Former Smoker    Types: Cigarettes    Quit date: 08/18/1979  . Smokeless tobacco: Former Neurosurgeon    Types: Chew   Comment: Pt uses on and off  . Alcohol Use: No  . Drug Use: No  . Sexually Active: Not on file   Other Topics Concern  . 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  Prudent diet.    Family History  Problem Relation Age of Onset  . Heart disease Father     Died of MI age 11  . Colon cancer Neg Hx   . Heart disease Paternal Uncle     MI's in his 34s    ROS: no fevers or chills, productive cough, hemoptysis, dysphasia, odynophagia, melena, hematochezia, dysuria, hematuria, rash, seizure activity, orthopnea, PND, pedal edema, claudication. Remaining systems are negative.  Physical Exam:   Blood pressure 131/81, pulse 80, height 6\' 1"  (1.854 m), weight 149 lb (67.586 kg).  General:  Well developed/well nourished in NAD Skin warm/dry Patient not depressed No peripheral clubbing Back-normal HEENT-normal/normal eyelids Neck supple/normal carotid upstroke bilaterally; no bruits; no JVD; no thyromegaly chest - CTA/ normal  expansion CV - RRR/normal S1 and S2; no murmurs, rubs or gallops;  PMI nondisplaced Abdomen -NT/ND, no HSM, no mass, + bowel sounds, no bruit 2+ femoral pulses, no bruits Ext-no edema, chords, 2+ DP Neuro-grossly nonfocal  ECG 08/12/12-Sinus rhythm with RVCD

## 2012-08-30 ENCOUNTER — Ambulatory Visit (HOSPITAL_BASED_OUTPATIENT_CLINIC_OR_DEPARTMENT_OTHER): Payer: BC Managed Care – PPO | Admitting: Radiology

## 2012-08-30 ENCOUNTER — Encounter: Payer: Self-pay | Admitting: Cardiology

## 2012-08-30 ENCOUNTER — Ambulatory Visit (HOSPITAL_COMMUNITY): Payer: BC Managed Care – PPO | Attending: Cardiology

## 2012-08-30 DIAGNOSIS — R072 Precordial pain: Secondary | ICD-10-CM

## 2012-08-30 DIAGNOSIS — R079 Chest pain, unspecified: Secondary | ICD-10-CM

## 2012-08-30 DIAGNOSIS — R0989 Other specified symptoms and signs involving the circulatory and respiratory systems: Secondary | ICD-10-CM

## 2012-08-30 NOTE — Progress Notes (Signed)
Stress Echocardiogram performed.  

## 2012-09-01 ENCOUNTER — Ambulatory Visit: Payer: BC Managed Care – PPO | Admitting: Cardiovascular Disease

## 2012-09-21 ENCOUNTER — Other Ambulatory Visit: Payer: Self-pay | Admitting: *Deleted

## 2012-09-21 MED ORDER — OMEPRAZOLE 20 MG PO CPDR: 20.0000 mg | DELAYED_RELEASE_CAPSULE | Freq: Two times a day (BID) | ORAL | Status: DC

## 2012-11-06 ENCOUNTER — Other Ambulatory Visit: Payer: Self-pay | Admitting: Family Medicine

## 2012-11-07 ENCOUNTER — Encounter: Payer: Self-pay | Admitting: Family Medicine

## 2012-11-07 NOTE — Telephone Encounter (Signed)
eScribe request for refill on MELOXICAM Last filled - 08/12/12, #30 X 1--given in office by Dr. Abner Greenspan for neck pain Last seen on - 08/12/12 Follow up - no appt in system Please advise refills.

## 2012-11-17 ENCOUNTER — Other Ambulatory Visit: Payer: Self-pay | Admitting: Family Medicine

## 2012-12-11 ENCOUNTER — Other Ambulatory Visit: Payer: Self-pay | Admitting: Family Medicine

## 2012-12-14 NOTE — Telephone Encounter (Signed)
eScribe request for refill on MELOXICAM Last filled - 11/07/12, 30 X 3 AND 11/18/12, #30 X 1 BY DR. Abner Greenspan Last seen on - 08/12/12 RX DENIED.

## 2013-01-19 ENCOUNTER — Ambulatory Visit (INDEPENDENT_AMBULATORY_CARE_PROVIDER_SITE_OTHER): Payer: BC Managed Care – PPO | Admitting: Family Medicine

## 2013-01-19 ENCOUNTER — Encounter: Payer: Self-pay | Admitting: Family Medicine

## 2013-01-19 VITALS — BP 142/74 | HR 74 | Temp 98.4°F | Resp 12 | Wt 158.2 lb

## 2013-01-19 DIAGNOSIS — E039 Hypothyroidism, unspecified: Secondary | ICD-10-CM

## 2013-01-19 LAB — T3: T3, Total: 85.3 ng/dL (ref 80.0–204.0)

## 2013-01-19 LAB — T4, FREE: Free T4: 0.75 ng/dL (ref 0.60–1.60)

## 2013-01-19 MED ORDER — MELOXICAM 15 MG PO TABS: 15.0000 mg | ORAL_TABLET | Freq: Every day | ORAL | Status: DC

## 2013-01-19 NOTE — Assessment & Plan Note (Signed)
Repeat TSH here, along with free T4, T3 total, TPO antibodies and anti-thyroglobulin antibodies. Start synthroid after results in if appropriate.

## 2013-01-19 NOTE — Progress Notes (Signed)
OFFICE NOTE  01/19/2013  CC:  Chief Complaint  Patient presents with  . Follow-up    Abnormal labs  . Medication Refill    Meloxicam     HPI: Patient is a 47 y.o. Caucasian male who is here for f/u of abnormal labs at his employer's: TSH up from 6.08 January 2012 to 10.8 this month--no T4 or T3 measured.  His cholesterol numbers trended up a bit as well but he also says he wasn't completely fasting.  He denies fatigue, skin changes, depression, constipation, or any other complaint at this time.  Mobic helps his back a lot, asks for RF.   Pertinent PMH:  Past Medical History  Diagnosis Date  . GERD (gastroesophageal reflux disease)   . Allergy   . Hyperlipidemia     HDL high, LDL high: simvastatin resulted in prolonged/severe muscle pain, led to long w/u and pt wants to avoid further statin unless lipids severely worsen  . Barrett esophagus 2012  . Subclinical hypothyroidism     Latest labs 01/2011 (TSH about 5.5, T3 and T4 wnl)  . Anxiety 08/12/2012  . Atypical chest pain Fall 2013    Stress echo NORMAL 08/30/12   Past family and social history reviewed and there are no changes since the patient's last office visit with me.  MEDS:  Outpatient Prescriptions Prior to Visit  Medication Sig Dispense Refill  . co-enzyme Q-10 30 MG capsule Take 100 mg by mouth daily. Take 2 100 mg daily       . cyclobenzaprine (FLEXERIL) 10 MG tablet Take 1 tablet (10 mg total) by mouth 2 (two) times daily as needed for muscle spasms (can cause sedation, use at bedtime).  30 tablet  0  . LORazepam (ATIVAN) 0.5 MG tablet Take 1 tablet (0.5 mg total) by mouth 2 (two) times daily as needed for anxiety (may cause sedation).  30 tablet  0  . meloxicam (MOBIC) 15 MG tablet TAKE 1 TABLET (15 MG TOTAL) BY MOUTH DAILY AS NEEDED FOR PAIN (IN AM WITH FOOD).  30 tablet  3  . meloxicam (MOBIC) 15 MG tablet TAKE 1 TABLET (15 MG TOTAL) BY MOUTH DAILY AS NEEDED FOR PAIN (IN AM WITH FOOD).  30 tablet  1  . Multiple  Vitamin (ONE-A-DAY MENS PO) Take by mouth daily.        . niacin 500 MG tablet Take 500 mg by mouth daily with breakfast.        . omeprazole (PRILOSEC) 20 MG capsule Take 1 capsule (20 mg total) by mouth 2 (two) times daily.  180 capsule  1  . aspirin EC 81 MG tablet Take 1 tablet (81 mg total) by mouth daily.      . nitroGLYCERIN (NITROSTAT) 0.4 MG SL tablet Place 1 tablet (0.4 mg total) under the tongue every 5 (five) minutes as needed for chest pain. If no relief after 3 tabs to ER  25 tablet  3   No facility-administered medications prior to visit.    PE: Blood pressure 142/74, pulse 74, temperature 98.4 F (36.9 C), temperature source Temporal, resp. rate 12, weight 158 lb 4 oz (71.782 kg), SpO2 97.00%. Gen: Alert, well appearing.  Patient is oriented to person, place, time, and situation. AFFECT: pleasant, lucid thought and speech. ENT: Eyes: no injection, icteris, swelling, or exudate.  EOMI, PERRLA. Nose: no drainage or turbinate edema/swelling.  No injection or focal lesion.  Mouth: lips without lesion/swelling.  Oral mucosa pink and moist.  Dentition intact  and without obvious caries or gingival swelling.  Oropharynx without erythema, exudate, or swelling.  Neck - No masses or thyromegaly or limitation in range of motion CV: RRR, no m/r/g.   LUNGS: CTA bilat, nonlabored resps, good aeration in all lung fields. Neuro: no tremor   IMPRESSION AND PLAN:  Hypothyroidism Repeat TSH here, along with free T4, T3 total, TPO antibodies and anti-thyroglobulin antibodies. Start synthroid after results in if appropriate.  Mild mixed hyperlipidemia: he has had bad myalgias with statin trial in the past.  Some of his changes could be associated with his mild hypothyroidism.  At any rate, emphasized TLC.  No cholesterol meds at this time.  An After Visit Summary was printed and given to the patient.  FOLLOW UP: 37mo

## 2013-01-20 LAB — THYROGLOBULIN LEVEL: Thyroglobulin: 29.9 ng/mL (ref 0.0–55.0)

## 2013-01-20 LAB — THYROID PEROXIDASE ANTIBODY: Thyroperoxidase Ab SerPl-aCnc: 93.6 IU/mL — ABNORMAL HIGH (ref <35.0)

## 2013-03-07 ENCOUNTER — Other Ambulatory Visit: Payer: BC Managed Care – PPO

## 2013-03-08 ENCOUNTER — Other Ambulatory Visit (INDEPENDENT_AMBULATORY_CARE_PROVIDER_SITE_OTHER): Payer: BC Managed Care – PPO

## 2013-03-08 DIAGNOSIS — E039 Hypothyroidism, unspecified: Secondary | ICD-10-CM

## 2013-03-08 LAB — T4, FREE: Free T4: 0.84 ng/dL (ref 0.60–1.60)

## 2013-03-08 LAB — LIPID PANEL
HDL: 47.8 mg/dL (ref >39.00)
VLDL: 14.4 mg/dL (ref 0.0–40.0)

## 2013-03-08 LAB — TSH: TSH: 4.63 u[IU]/mL (ref 0.35–5.50)

## 2013-03-08 NOTE — Progress Notes (Signed)
Labs only

## 2013-03-09 LAB — LDL CHOLESTEROL, DIRECT: Direct LDL: 155.1 mg/dL

## 2013-03-20 ENCOUNTER — Ambulatory Visit: Payer: BC Managed Care – PPO | Admitting: Family Medicine

## 2013-07-18 ENCOUNTER — Encounter: Payer: Self-pay | Admitting: Internal Medicine

## 2013-08-14 ENCOUNTER — Telehealth: Payer: Self-pay | Admitting: Internal Medicine

## 2013-08-14 NOTE — Telephone Encounter (Signed)
I have not spoken with the pt yet. Left a message for him to call me. Barrett's Esophagus found on 10/310/12 and Dr Juanda Chance wrote of a Recall EGD in 2 years; now.  His call states he is feeling great and wants to wait another year before repeating the EGD. If asymptomatic, what do you advise? Thanks.

## 2013-08-14 NOTE — Telephone Encounter (Signed)
OK to wait another year.

## 2013-08-15 NOTE — Telephone Encounter (Signed)
Changed recall to 08/2014 and informed pt of Dr Regino Schultze decision; he will call if he has any problems.

## 2013-09-07 ENCOUNTER — Other Ambulatory Visit: Payer: Self-pay

## 2013-11-08 ENCOUNTER — Other Ambulatory Visit: Payer: Self-pay | Admitting: Internal Medicine

## 2013-11-15 ENCOUNTER — Other Ambulatory Visit: Payer: Self-pay | Admitting: Internal Medicine

## 2013-11-15 ENCOUNTER — Telehealth: Payer: Self-pay | Admitting: Internal Medicine

## 2013-11-15 NOTE — Telephone Encounter (Signed)
The only "agreement" made was that he wait 1 more year for endoscopy. We still need to see him in the office every 2 years.

## 2013-11-17 ENCOUNTER — Telehealth: Payer: Self-pay | Admitting: Family Medicine

## 2013-11-17 ENCOUNTER — Ambulatory Visit: Payer: BC Managed Care – PPO | Admitting: Nurse Practitioner

## 2013-11-17 MED ORDER — OMEPRAZOLE 20 MG PO CPDR: 20.0000 mg | DELAYED_RELEASE_CAPSULE | Freq: Two times a day (BID) | ORAL | Status: DC

## 2013-11-17 NOTE — Telephone Encounter (Signed)
Patient called requesting omeprazole refill for 90 day supply.  I told him I would send it into [facility] but he had to make an apt and keep the apt for any further refills.   Patient aware and stated he would be in next Monday at 9:30.

## 2013-11-20 ENCOUNTER — Ambulatory Visit: Payer: BC Managed Care – PPO | Admitting: Family Medicine

## 2013-11-22 ENCOUNTER — Encounter: Payer: Self-pay | Admitting: Family Medicine

## 2013-11-22 ENCOUNTER — Ambulatory Visit: Payer: BC Managed Care – PPO | Admitting: Family Medicine

## 2013-11-22 ENCOUNTER — Ambulatory Visit (INDEPENDENT_AMBULATORY_CARE_PROVIDER_SITE_OTHER): Payer: BC Managed Care – PPO | Admitting: Family Medicine

## 2013-11-22 VITALS — BP 125/79 | HR 58 | Temp 97.8°F | Resp 16 | Ht 73.0 in | Wt 155.0 lb

## 2013-11-22 DIAGNOSIS — E039 Hypothyroidism, unspecified: Secondary | ICD-10-CM

## 2013-11-22 DIAGNOSIS — K219 Gastro-esophageal reflux disease without esophagitis: Secondary | ICD-10-CM

## 2013-11-22 DIAGNOSIS — E038 Other specified hypothyroidism: Secondary | ICD-10-CM

## 2013-11-22 DIAGNOSIS — E785 Hyperlipidemia, unspecified: Secondary | ICD-10-CM

## 2013-11-22 LAB — T4, FREE: FREE T4: 0.81 ng/dL (ref 0.60–1.60)

## 2013-11-22 LAB — LDL CHOLESTEROL, DIRECT: LDL DIRECT: 160.2 mg/dL

## 2013-11-22 LAB — LIPID PANEL
Cholesterol: 221 mg/dL — ABNORMAL HIGH (ref 0–200)
HDL: 56.1 mg/dL (ref >39.00)
TRIGLYCERIDES: 61 mg/dL (ref 0.0–149.0)
Total CHOL/HDL Ratio: 4
VLDL: 12.2 mg/dL (ref 0.0–40.0)

## 2013-11-22 LAB — TSH: TSH: 4 u[IU]/mL (ref 0.35–5.50)

## 2013-11-22 NOTE — Progress Notes (Signed)
Office Note 11/30/2013  CC:  Chief Complaint  Patient presents with  . Hypothyroidism    follow up    HPI:  Tyler Schmidt is a 48 y.o. White male who is here for 1 yr f/u for subclinical hypothyroidism, GERD. Has run out of omeprazole.  Takes omeprazole daily.  When off of this med he does have signif sx's but admits to dietary indiscretion. Running for exercise.  Good energy levels, good appetite, bowels moving normally, no skin or hair changes.  He is fasting today in prep for chol recheck--has been eating a heart healthy diet.  Past Medical History  Diagnosis Date  . GERD (gastroesophageal reflux disease)   . Allergy   . Hyperlipidemia     HDL high, LDL high: simvastatin resulted in prolonged/severe muscle pain, led to long w/u and pt wants to avoid further statin unless lipids severely worsen  . Barrett esophagus 2012  . Subclinical hypothyroidism     Patient give hx of hypfunctioning goiter in the distant past, says he was given pills to "kill" the goiter.  Says bx of goiter was benign.  . Anxiety 08/12/2012  . Atypical chest pain Fall 2013    Stress echo NORMAL 08/30/12    Past Surgical History  Procedure Laterality Date  . Esophagogastroduodenoscopy  09/2011    Dr. Olevia Perches  . 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 Doreatha Lew)  . Cardiovascular stress test  08/2012    Stress echo normal    Family History  Problem Relation Age of Onset  . Heart disease Father     Died of MI age 9  . Colon cancer Neg Hx   . Heart disease Paternal Uncle     MI's in his 40s    History   Social History  . Marital Status: Married    Spouse Name: N/A    Number of Children: 2  . Years of Education: N/A   Occupational History  .      Pilot   Social History Main Topics  . Smoking status: Former Smoker    Types: Cigarettes    Quit date: 08/18/1979  . Smokeless tobacco: Former Systems developer    Types: Chew     Comment: Pt uses on  and off  . Alcohol Use: No  . Drug Use: No  . Sexual Activity: Not on file   Other Topics Concern  . Not on file   Social History Narrative   Married, boy/girl twins age 76yrs.   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   Originally from [place].   No T/A/Ds.   Cardiovasc exercise regularly.  Prudent diet.          Outpatient Prescriptions Prior to Visit  Medication Sig Dispense Refill  . co-enzyme Q-10 30 MG capsule Take 100 mg by mouth daily. Take 2 100 mg daily       . Krill Oil CAPS Take 1 capsule by mouth.      . Multiple Vitamin (ONE-A-DAY MENS PO) Take by mouth daily.        . niacin 500 MG tablet Take 500 mg by mouth daily with breakfast.        . omeprazole (PRILOSEC) 20 MG capsule Take 1 capsule (20 mg total) by mouth 2 (two) times daily.  180 capsule  0  . aspirin EC 81 MG tablet Take 1 tablet (81 mg total) by mouth daily.      Marland Kitchen  cyclobenzaprine (FLEXERIL) 10 MG tablet Take 1 tablet (10 mg total) by mouth 2 (two) times daily as needed for muscle spasms (can cause sedation, use at bedtime).  30 tablet  0  . LORazepam (ATIVAN) 0.5 MG tablet Take 1 tablet (0.5 mg total) by mouth 2 (two) times daily as needed for anxiety (may cause sedation).  30 tablet  0  . meloxicam (MOBIC) 15 MG tablet Take 1 tablet (15 mg total) by mouth daily.  30 tablet  3  . nitroGLYCERIN (NITROSTAT) 0.4 MG SL tablet Place 1 tablet (0.4 mg total) under the tongue every 5 (five) minutes as needed for chest pain. If no relief after 3 tabs to ER  25 tablet  3   No facility-administered medications prior to visit.    Allergies  Allergen Reactions  . Simvastatin Other (See Comments)    myalgias    PE; Blood pressure 125/79, pulse 58, temperature 97.8 F (36.6 C), temperature source Temporal, resp. rate 16, height 6\' 1"  (1.854 m), weight 155 lb (70.308 kg), SpO2 98.00%. Gen: Alert, well appearing.  Patient is oriented to person, place, time, and situation. ZLD:JTTS: no injection, icteris, swelling, or  exudate.  EOMI, PERRLA. Mouth: lips without lesion/swelling.  Oral mucosa pink and moist. Oropharynx without erythema, exudate, or swelling.  Neck - No masses or thyromegaly or limitation in range of motion CV: RRR, no m/r/g.   LUNGS: CTA bilat, nonlabored resps, good aeration in all lung fields.  Pertinent labs:  none  ASSESSMENT AND PLAN:   GERD (gastroesophageal reflux disease) Trial of OTC H2 blocker prn discussed.  GERD diet discussed, handout given.  Hyperlipidemia Check FLP.  Subclinical hypothyroidism Check TSH, free T4, and T3 total.   An After Visit Summary was printed and given to the patient.  FOLLOW UP:  Return in about 6 months (around 05/22/2014) for fasting CPE.

## 2013-11-22 NOTE — Progress Notes (Signed)
Pre visit review using our clinic review tool, if applicable. No additional management support is needed unless otherwise documented below in the visit note. 

## 2013-11-23 LAB — T3: T3, Total: 112 ng/dL (ref 80.0–204.0)

## 2013-11-30 DIAGNOSIS — E785 Hyperlipidemia, unspecified: Secondary | ICD-10-CM | POA: Insufficient documentation

## 2013-11-30 DIAGNOSIS — K219 Gastro-esophageal reflux disease without esophagitis: Secondary | ICD-10-CM | POA: Insufficient documentation

## 2013-11-30 NOTE — Assessment & Plan Note (Signed)
Trial of OTC H2 blocker prn discussed.  GERD diet discussed, handout given.

## 2013-11-30 NOTE — Assessment & Plan Note (Signed)
Check FLP 

## 2013-11-30 NOTE — Assessment & Plan Note (Signed)
Check TSH, free T4, and T3 total.

## 2014-03-16 ENCOUNTER — Other Ambulatory Visit: Payer: Self-pay | Admitting: Family Medicine

## 2014-04-12 ENCOUNTER — Encounter: Payer: Self-pay | Admitting: Family Medicine

## 2014-04-12 ENCOUNTER — Ambulatory Visit (INDEPENDENT_AMBULATORY_CARE_PROVIDER_SITE_OTHER): Payer: BC Managed Care – PPO | Admitting: Family Medicine

## 2014-04-12 VITALS — BP 115/77 | HR 79 | Temp 98.6°F | Resp 16 | Ht 73.0 in | Wt 159.0 lb

## 2014-04-12 DIAGNOSIS — J029 Acute pharyngitis, unspecified: Secondary | ICD-10-CM

## 2014-04-12 MED ORDER — AMOXICILLIN 875 MG PO TABS: 875.0000 mg | ORAL_TABLET | Freq: Two times a day (BID) | ORAL | Status: AC

## 2014-04-12 NOTE — Progress Notes (Signed)
Pre visit review using our clinic review tool, if applicable. No additional management support is needed unless otherwise documented below in the visit note. 

## 2014-04-12 NOTE — Progress Notes (Signed)
OFFICE NOTE  04/12/2014  CC:  Chief Complaint  Patient presents with  . exposure to strep throat  . Sore Throat  . Fatigue   HPI: Patient is a 48 y.o. Caucasian male who is here for sore throat. Onset about 3 d/a, malaise/fatigue, achy, scratch and mild soreness in throat.  Mild PND.  No cough.  No fever. Two of his children with + strep in last few days. Ibuprofen q6h or so helps moderately.  Pertinent PMH:  Past medical, surgical, social, and family history reviewed and no changes are noted since last office visit.  MEDS:  Outpatient Prescriptions Prior to Visit  Medication Sig Dispense Refill  . aspirin EC 81 MG tablet Take 1 tablet (81 mg total) by mouth daily.      Marland Kitchen co-enzyme Q-10 30 MG capsule Take 100 mg by mouth daily. Take 2 100 mg daily       . Krill Oil CAPS Take 1 capsule by mouth.      . Multiple Vitamin (ONE-A-DAY MENS PO) Take by mouth daily.        . niacin 500 MG tablet Take 500 mg by mouth daily with breakfast.        . omeprazole (PRILOSEC) 20 MG capsule TAKE 1 CAPSULE (20 MG TOTAL) BY MOUTH 2 (TWO) TIMES DAILY.  180 capsule  0  . LORazepam (ATIVAN) 0.5 MG tablet Take 1 tablet (0.5 mg total) by mouth 2 (two) times daily as needed for anxiety (may cause sedation).  30 tablet  0  . cyclobenzaprine (FLEXERIL) 10 MG tablet Take 1 tablet (10 mg total) by mouth 2 (two) times daily as needed for muscle spasms (can cause sedation, use at bedtime).  30 tablet  0  . meloxicam (MOBIC) 15 MG tablet Take 1 tablet (15 mg total) by mouth daily.  30 tablet  3  . nitroGLYCERIN (NITROSTAT) 0.4 MG SL tablet Place 1 tablet (0.4 mg total) under the tongue every 5 (five) minutes as needed for chest pain. If no relief after 3 tabs to ER  25 tablet  3   No facility-administered medications prior to visit.    PE: Blood pressure 115/77, pulse 79, temperature 98.6 F (37 C), temperature source Temporal, resp. rate 16, height 6\' 1"  (1.854 m), weight 159 lb (72.122 kg), SpO2 97.00%. VS:  noted--normal. Gen: alert, NAD, NONTOXIC APPEARING. HEENT: eyes without injection, drainage, or swelling.  Ears: EACs clear, TMs with normal light reflex and landmarks.  Nose: clear, no edema or signif injection.  No purulent d/c.  No paranasal sinus TTP.  No facial swelling.  Throat and mouth without focal lesion. Mild diffuse pharyngeal, soft palate erythema, some uvular petechiae noted.  I see no PND. Neck: supple, some mild non-tender submandibular LAD.   LUNGS: CTA bilat, nonlabored resps.   CV: RRR, no m/r/g. EXT: no c/c/e SKIN: no rash  IMPRESSION AND PLAN:  Pharyngitis, suspect group A strep. Amoxil 875mg  bid x 10d. Continue symptomatic care with ibuprofen 600 mg q6h prn.  An After Visit Summary was printed and given to the patient.  FOLLOW UP: prn

## 2014-05-02 ENCOUNTER — Telehealth: Payer: Self-pay | Admitting: Family Medicine

## 2014-05-02 NOTE — Telephone Encounter (Signed)
Based on his last cholesterol panel done 11/2013, he does NOT need a statin.  Reassure patient. I would repeat fasting cholesterol panel again after 11/2014.

## 2014-05-02 NOTE — Telephone Encounter (Signed)
Patient concerned with cholesterol / plaque build up and wondered if you thought he would be a good candidate to try a statin again.  Patient previous cardiologist wanted him to be on one but cardiologist is retired at this point.  He did have problems with simvastatin.  Pt just looking for a second opinion but he states that if he needs an office visit he is willing to come in.  Please advise.

## 2014-05-03 NOTE — Telephone Encounter (Signed)
Left detailed message on patient's phone.  Okay per our last discussion.

## 2014-06-29 ENCOUNTER — Encounter: Payer: Self-pay | Admitting: Internal Medicine

## 2014-08-06 ENCOUNTER — Other Ambulatory Visit: Payer: Self-pay | Admitting: *Deleted

## 2014-08-06 MED ORDER — OMEPRAZOLE 20 MG PO CPDR: DELAYED_RELEASE_CAPSULE | ORAL | Status: DC

## 2014-08-17 ENCOUNTER — Other Ambulatory Visit: Payer: Self-pay

## 2014-10-02 ENCOUNTER — Encounter: Payer: Self-pay | Admitting: Nurse Practitioner

## 2014-10-02 ENCOUNTER — Ambulatory Visit (INDEPENDENT_AMBULATORY_CARE_PROVIDER_SITE_OTHER): Payer: BC Managed Care – PPO | Admitting: Nurse Practitioner

## 2014-10-02 VITALS — BP 132/79 | HR 76 | Temp 98.6°F | Resp 18 | Ht 73.0 in | Wt 165.0 lb

## 2014-10-02 DIAGNOSIS — J0110 Acute frontal sinusitis, unspecified: Secondary | ICD-10-CM

## 2014-10-02 DIAGNOSIS — R5383 Other fatigue: Secondary | ICD-10-CM

## 2014-10-02 MED ORDER — AMOXICILLIN-POT CLAVULANATE 875-125 MG PO TABS: 1.0000 | ORAL_TABLET | Freq: Two times a day (BID) | ORAL | Status: DC

## 2014-10-02 NOTE — Patient Instructions (Addendum)
Start antibiotic. Eat yogurt daily to help prevent antibiotic -associated diarrhea.  Continue daily sinus rinses.  Please follow up w/Dr McGowen in 2 weeks if fatigue is persistent.  Start antibiotic. Eat yogurt daily at lunch or afternoon to help prevent diarrhea that can be caused by antibiotic. Start daily sinus rinses (Neilmed Sinus rinse) for at least 5-7 days.  Please call for re-evaluation if you are not improving.   Sinusitis Sinusitis is redness, soreness, and swelling (inflammation) of the paranasal sinuses. Paranasal sinuses are air pockets within the bones of your face (beneath the eyes, the middle of the forehead, or above the eyes). In healthy paranasal sinuses, mucus is able to drain out, and air is able to circulate through them by way of your nose. However, when your paranasal sinuses are inflamed, mucus and air can become trapped. This can allow bacteria and other germs to grow and cause infection. Sinusitis can develop quickly and last only a short time (acute) or continue over a long period (chronic). Sinusitis that lasts for more than 12 weeks is considered chronic.  CAUSES  Causes of sinusitis include:  Allergies.  Structural abnormalities, such as displacement of the cartilage that separates your nostrils (deviated septum), which can decrease the air flow through your nose and sinuses and affect sinus drainage.  Functional abnormalities, such as when the small hairs (cilia) that line your sinuses and help remove mucus do not work properly or are not present. SYMPTOMS  Symptoms of acute and chronic sinusitis are the same. The primary symptoms are pain and pressure around the affected sinuses. Other symptoms include:  Upper toothache.  Earache.  Headache.  Bad breath.  Decreased sense of smell and taste.  A cough, which worsens when you are lying flat.  Fatigue.  Fever.  Thick drainage from your nose, which often is green and may contain pus  (purulent).  Swelling and warmth over the affected sinuses. DIAGNOSIS  Your caregiver will perform a physical exam. During the exam, your caregiver may:  Look in your nose for signs of abnormal growths in your nostrils (nasal polyps).  Tap over the affected sinus to check for signs of infection.  View the inside of your sinuses (endoscopy) with a special imaging device with a light attached (endoscope), which is inserted into your sinuses. If your caregiver suspects that you have chronic sinusitis, one or more of the following tests may be recommended:  Allergy tests.  Nasal culture A sample of mucus is taken from your nose and sent to a lab and screened for bacteria.  Nasal cytology A sample of mucus is taken from your nose and examined by your caregiver to determine if your sinusitis is related to an allergy. TREATMENT  Most cases of acute sinusitis are related to a viral infection and will resolve on their own within 10 days. Sometimes medicines are prescribed to help relieve symptoms (pain medicine, decongestants, nasal steroid sprays, or saline sprays).  However, for sinusitis related to a bacterial infection, your caregiver will prescribe antibiotic medicines. These are medicines that will help kill the bacteria causing the infection.  Rarely, sinusitis is caused by a fungal infection. In theses cases, your caregiver will prescribe antifungal medicine. For some cases of chronic sinusitis, surgery is needed. Generally, these are cases in which sinusitis recurs more than 3 times per year, despite other treatments. HOME CARE INSTRUCTIONS   Drink plenty of water. Water helps thin the mucus so your sinuses can drain more easily.  Use a  humidifier.  Inhale steam 3 to 4 times a day (for example, sit in the bathroom with the shower running).  Apply a warm, moist washcloth to your face 3 to 4 times a day, or as directed by your caregiver.  Use saline nasal sprays to help moisten and  clean your sinuses.  Take over-the-counter or prescription medicines for pain, discomfort, or fever only as directed by your caregiver. SEEK IMMEDIATE MEDICAL CARE IF:  You have increasing pain or severe headaches.  You have nausea, vomiting, or drowsiness.  You have swelling around your face.  You have vision problems.  You have a stiff neck.  You have difficulty breathing. MAKE SURE YOU:   Understand these instructions.  Will watch your condition.  Will get help right away if you are not doing well or get worse. Document Released: 10/19/2005 Document Revised: 01/11/2012 Document Reviewed: 11/03/2011 [facility] Patient Information 2014 [place], [place].

## 2014-10-02 NOTE — Progress Notes (Signed)
Pre visit review using our clinic review tool, if applicable. No additional management support is needed unless otherwise documented below in the visit note. 

## 2014-10-04 DIAGNOSIS — R5383 Other fatigue: Secondary | ICD-10-CM | POA: Insufficient documentation

## 2014-10-04 NOTE — Progress Notes (Signed)
Subjective:     Tyler Schmidt is a 48 y.o. male presents c/o feeling achy & weak for about 1 mo. Associated symptoms: nasal congestion & cough that waxes & wanes; itchy nose, burning eyes. He has continued to run 3 days/week, but states when he stops activity at end of day-feels "run down". He denies fever, joint pain, rash. He was using netty pot regularly until about 1 week ago when he learned of potential amoeba infection from using tap water in sinus rinse.  The following portions of the patient's history were reviewed and updated as appropriate: allergies, current medications, past medical history, past social history, past surgical history and problem list.  Review of Systems Constitutional: positive for fatigue, negative for fevers, night sweats and weight loss Eyes: positive for irritation, negative for redness and visual disturbance Ears, nose, mouth, throat, and face: positive for nasal congestion, negative for earaches and sore throat Respiratory: positive for cough, negative for dyspnea on exertion, pleurisy/chest pain, sputum and wheezing Cardiovascular: negative for chest pressure/discomfort, irregular heart beat and lower extremity edema Gastrointestinal: negative for diarrhea and nausea Neurological: negative for headaches    Objective:    BP 132/79 mmHg  Pulse 76  Temp(Src) 98.6 F (37 C) (Oral)  Resp 18  Ht 6\' 1"  (1.854 m)  Wt 165 lb (74.844 kg)  BMI 21.77 kg/m2  SpO2 95% BP 132/79 mmHg  Pulse 76  Temp(Src) 98.6 F (37 C) (Oral)  Resp 18  Ht 6\' 1"  (1.854 m)  Wt 165 lb (74.844 kg)  BMI 21.77 kg/m2  SpO2 95% General appearance: alert, cooperative, appears stated age and no distress Head: Normocephalic, without obvious abnormality, atraumatic Eyes: negative findings: lids and lashes normal and conjunctivae and sclerae normal Ears: normal TM's and external ear canals both ears Nose: Nares normal. Septum midline. Mucosa normal. No drainage or sinus  tenderness. Throat: lips, mucosa, and tongue normal; teeth and gums normal Lungs: clear to auscultation bilaterally Heart: regular rate and rhythm, S1, S2 normal, no murmur, click, rub or gallop Lymph nodes: no cervical, [place], or axillary LAD    Assessment:   1. Acute frontal sinusitis, recurrence not specified Dd: bacterial or fungal sinusitis, chronic allergies - amoxicillin-clavulanate (AUGMENTIN) 875-125 MG per tablet; Take 1 tablet by mouth 2 (two) times daily.  Dispense: 10 tablet; Refill: 0 Continue sinus rinses-reassured amoeba cannot live in water colder than 86 degrees-OK to use tap water for sinus rinse.  2. Other fatigue Possibly due to infection If no resolve after tx w/abx consider w/u for anemia, AI disease, thyroid dz

## 2015-01-02 ENCOUNTER — Encounter: Payer: Self-pay | Admitting: Internal Medicine

## 2015-02-04 ENCOUNTER — Other Ambulatory Visit: Payer: Self-pay | Admitting: *Deleted

## 2015-02-04 MED ORDER — OMEPRAZOLE 20 MG PO CPDR: DELAYED_RELEASE_CAPSULE | ORAL | Status: DC

## 2015-03-29 ENCOUNTER — Encounter: Payer: Self-pay | Admitting: Internal Medicine

## 2015-04-02 ENCOUNTER — Telehealth: Payer: Self-pay | Admitting: Internal Medicine

## 2015-04-02 NOTE — Telephone Encounter (Signed)
Pt states he swallowed some hamburger last night and his throat hurt a lot when he swallowed it and he still has a sore place in his throat now.States it is some better. Discussed with pt that if it is still bothering him when he returns from out of town we could see him or if it is bothering him enough now he could go to an urgent care. Pt verbalized understanding.

## 2015-05-10 ENCOUNTER — Encounter: Payer: Self-pay | Admitting: Internal Medicine

## 2015-05-23 ENCOUNTER — Ambulatory Visit (AMBULATORY_SURGERY_CENTER): Payer: Self-pay

## 2015-05-23 VITALS — Ht 72.0 in | Wt 160.0 lb

## 2015-05-23 DIAGNOSIS — K2270 Barrett's esophagus without dysplasia: Secondary | ICD-10-CM

## 2015-05-23 NOTE — Progress Notes (Signed)
No allergies to eggs or soy No home oxygen No diet/weight loss meds No past problems with anesthesia  Has had EGD; does not want emmi video; has email

## 2015-05-29 ENCOUNTER — Encounter: Payer: Self-pay | Admitting: Gastroenterology

## 2015-06-05 ENCOUNTER — Encounter: Payer: Self-pay | Admitting: Internal Medicine

## 2015-07-25 ENCOUNTER — Ambulatory Visit (AMBULATORY_SURGERY_CENTER): Payer: BLUE CROSS/BLUE SHIELD | Admitting: Gastroenterology

## 2015-07-25 ENCOUNTER — Encounter: Payer: Self-pay | Admitting: Gastroenterology

## 2015-07-25 VITALS — BP 115/75 | HR 69 | Temp 97.8°F | Resp 22 | Ht 72.0 in | Wt 160.0 lb

## 2015-07-25 DIAGNOSIS — K317 Polyp of stomach and duodenum: Secondary | ICD-10-CM | POA: Diagnosis not present

## 2015-07-25 DIAGNOSIS — K2270 Barrett's esophagus without dysplasia: Secondary | ICD-10-CM | POA: Diagnosis present

## 2015-07-25 MED ORDER — SODIUM CHLORIDE 0.9 % IV SOLN: 500.0000 mL | INTRAVENOUS | Status: DC

## 2015-07-25 NOTE — Op Note (Signed)
[place]  [address] [place], [address]   ENDOSCOPY PROCEDURE REPORT  PATIENT: Tyler Schmidt, Tyler Schmidt  MR#: [id number] BIRTHDATE: 20-Dec-1965 , 57  yrs. old GENDER: male ENDOSCOPIST: [place] Cellar, MD REFERRED BY: PROCEDURE DATE:  07/25/2015 PROCEDURE:  EGD w/ snare polypectomy and EGD w/ biopsy ASA CLASS:     Class II INDICATIONS:  49 y/o male with history of short segment non dysplastic Barrett's diagnosed in 2012.  Here for surveillance EGD. Symptoms well controlled on low dose omeprazole. MEDICATIONS: Propofol 230 mg IV TOPICAL ANESTHETIC:  DESCRIPTION OF PROCEDURE: After the risks benefits and alternatives of the procedure were thoroughly explained, informed consent was obtained.  The LB [id number] endoscope was introduced through the mouth and advanced to the second portion of the duodenum , Without limitations.  The instrument was slowly withdrawn as the mucosa was fully examined.    FINDINGS: The esophagus was normal without mucosal abnormalities. DH and GEJ noted 41cm from the incisors, with a roughly 1cm short segment of suspected Barrett's ([place] classification C0.5M1).  No nodularity was appreciated within the segment when viewed on NBI. Multiple biopsies were obtained.  A few small gastric polyps were noted in the gastric body and antrum, the largest 3 (roughly 6-67mm in size) were removed via hot snare with minimal blood loss. Suspect benign fundic gland polyps.  The remainder of the stomach was normal.  Laxity of the [facility] was noted on retroflexion.The duodenal bulb and 2nd portion of the duodenum were normal. Retroflexed views revealed no abnormalities.     The scope was then withdrawn from the patient and the procedure completed.  COMPLICATIONS: There were no immediate complications.  ENDOSCOPIC IMPRESSION: Suspected short segment of nondysplastic Barrett's without nodularity - biopsies obtained A few small benign appearing  gastric polyps, suspect fundic gland polyps, largest 3 removed via hot snare Normal remainder of examined stomach Normal duodenum.  RECOMMENDATIONS: Resume diet Resume medications Await pathology results.  Further recommendations pending results of pathology results. No NSAIDs for 2 weeks post polypectomy to minimize risk of bleeding at polypectomy site.    eSigned:  [place] Cellar, MD 07/25/2015 8:50 AM    CC: the patient  PATIENT NAME:  Tyler Schmidt, Tyler Schmidt MR#: [id number]

## 2015-07-25 NOTE — Patient Instructions (Signed)
Discharge instructions given. Biopsies taken. Resume previous medications. YOU HAD AN ENDOSCOPIC PROCEDURE TODAY AT THE [facility]:   Refer to the procedure report that was given to you for any specific questions about what was found during the examination.  If the procedure report does not answer your questions, please call your gastroenterologist to clarify.  If you requested that your care partner not be given the details of your procedure findings, then the procedure report has been included in a sealed envelope for you to review at your convenience later.  YOU SHOULD EXPECT: Some feelings of bloating in the abdomen. Passage of more gas than usual.  Walking can help get rid of the air that was put into your GI tract during the procedure and reduce the bloating. If you had a lower endoscopy (such as a colonoscopy or flexible sigmoidoscopy) you may notice spotting of blood in your stool or on the toilet paper. If you underwent a bowel prep for your procedure, you may not have a normal bowel movement for a few days.  Please Note:  You might notice some irritation and congestion in your nose or some drainage.  This is from the oxygen used during your procedure.  There is no need for concern and it should clear up in a day or so.  SYMPTOMS TO REPORT IMMEDIATELY:   Following upper endoscopy (EGD)  Vomiting of blood or coffee ground material  New chest pain or pain under the shoulder blades  Painful or persistently difficult swallowing  New shortness of breath  Fever of 100F or higher  Black, tarry-looking stools  For urgent or emergent issues, a gastroenterologist can be reached at any hour by calling [phone].   DIET: Your first meal following the procedure should be a small meal and then it is ok to progress to your normal diet. Heavy or fried foods are harder to digest and may make you feel nauseous or bloated.  Likewise, meals heavy in dairy and vegetables can increase  bloating.  Drink plenty of fluids but you should avoid alcoholic beverages for 24 hours.  ACTIVITY:  You should plan to take it easy for the rest of today and you should NOT DRIVE or use heavy machinery until tomorrow (because of the sedation medicines used during the test).    FOLLOW UP: Our staff will call the number listed on your records the next business day following your procedure to check on you and address any questions or concerns that you may have regarding the information given to you following your procedure. If we do not reach you, we will leave a message.  However, if you are feeling well and you are not experiencing any problems, there is no need to return our call.  We will assume that you have returned to your regular daily activities without incident.  If any biopsies were taken you will be contacted by phone or by letter within the next 1-3 weeks.  Please call us at [phone] if you have not heard about the biopsies in 3 weeks.    SIGNATURES/CONFIDENTIALITY: You and/or your care partner have signed paperwork which will be entered into your electronic medical record.  These signatures attest to the fact that that the information above on your After Visit Summary has been reviewed and is understood.  Full responsibility of the confidentiality of this discharge information lies with you and/or your care-partner. 

## 2015-07-25 NOTE — Progress Notes (Signed)
Called to room to assist during endoscopic procedure.  Patient ID and intended procedure confirmed with present staff. Received instructions for my participation in the procedure from the performing physician.  

## 2015-07-25 NOTE — Progress Notes (Signed)
Report to PACU, RN, vss, BBS= Clear.  

## 2015-07-26 ENCOUNTER — Telehealth: Payer: Self-pay | Admitting: *Deleted

## 2015-07-26 NOTE — Telephone Encounter (Signed)
  Follow up Call-  Call back number 07/25/2015  Post procedure Call Back phone  # [phone]  Permission to leave phone message Yes     Patient questions:  Do you have a fever, pain , or abdominal swelling? No. Pain Score  0 *  Have you tolerated food without any problems? Yes.    Have you been able to return to your normal activities? Yes.    Do you have any questions about your discharge instructions: Diet   No. Medications  No. Follow up visit  No.  Do you have questions or concerns about your Care? No.  Actions: * If pain score is 4 or above: No action needed, pain <4.

## 2015-07-30 ENCOUNTER — Encounter: Payer: Self-pay | Admitting: Gastroenterology

## 2015-09-17 ENCOUNTER — Encounter: Payer: Self-pay | Admitting: Family Medicine

## 2015-09-17 ENCOUNTER — Ambulatory Visit (INDEPENDENT_AMBULATORY_CARE_PROVIDER_SITE_OTHER): Payer: BLUE CROSS/BLUE SHIELD | Admitting: Family Medicine

## 2015-09-17 VITALS — BP 123/87 | HR 95 | Temp 98.1°F | Resp 20 | Wt 165.0 lb

## 2015-09-17 DIAGNOSIS — J209 Acute bronchitis, unspecified: Secondary | ICD-10-CM | POA: Diagnosis not present

## 2015-09-17 MED ORDER — DOXYCYCLINE HYCLATE 100 MG PO TABS: 100.0000 mg | ORAL_TABLET | Freq: Two times a day (BID) | ORAL | Status: DC

## 2015-09-17 MED ORDER — PREDNISONE 50 MG PO TABS: ORAL_TABLET | ORAL | Status: DC

## 2015-09-17 MED ORDER — FLUTICASONE PROPIONATE 50 MCG/ACT NA SUSP
2.0000 | Freq: Every day | NASAL | Status: DC
Start: 2015-09-17 — End: 2018-08-09

## 2015-09-17 NOTE — Progress Notes (Signed)
   Subjective:    Patient ID: Tyler Schmidt, male    DOB: [birth year], 49 y.o.   MRN: [id number]  HPI  Cough: Patient presents with a cough and nasal drainage almost 1 week ago. He states he had symptoms of a chest cold at that time the cough was not productive, and has become productive. He felt a mild wheeze in his chest this morning. He states when he coughs he gets a sharp pain in the center of his chest and a burning. The last 2 days he's progressed to rhinorrhea and nasal congestion with a slight headache. He does not recall having a fever, or experiencing chills. He is feeling more fatigued. He is eating and drinking well. He has been taking Robitussin-DM and drinking lots of water. No ear pain  former smoker    Allergies  Allergen Reactions  . Simvastatin Other (See Comments)    myalgias    Review of Systems Negative, with the exception of above mentioned in HPI     Objective:   Physical Exam BP 123/87 mmHg  Pulse 95  Temp(Src) 98.1 F (36.7 C) (Oral)  Resp 20  Wt 165 lb (74.844 kg)  SpO2 93% Gen: Afebrile. No acute distress. Nontoxic in appearance, well-developed, well-nourished, Caucasian male. Very pleasant. HENT: AT. [place]. Bilateral TM visualized shiny/full, no erythema or bulging. MMM. Bilateral nares with erythema, no swelling. Throat with mild erythema, no exudates. Hoarseness present on exam. Cough present on exam. Mild tenderness to palpation maxillary sinuses. Eyes:Pupils Equal Round Reactive to light, Extraocular movements intact,  Conjunctiva without redness, discharge or icterus. Neck/lymp/endocrine: Supple, mild cervical anterior lymphadenopathy CV: RRRs Chest: Very mild wheeze left lower lobe, otherwise clear to auscultation bilaterally. Skin: No rashes, purpura or petechiae.      Assessment & Plan:  1. Acute bronchitis, unspecified organism - Signs and symptoms of bronchitis today, very mild wheeze on exam. Doxycycline, Flonase, prednisone, rest,  hydration, nasal saline. - predniSONE (DELTASONE) 50 MG tablet; Take 50 mg daily for 5 days with food  Dispense: 5 tablet; Refill: 0 - doxycycline (VIBRA-TABS) 100 MG tablet; Take 1 tablet (100 mg total) by mouth 2 (two) times daily.  Dispense: 20 tablet; Refill: 0 - fluticasone (FLONASE) 50 MCG/ACT nasal spray; Place 2 sprays into both nostrils daily.  Dispense: 16 g; Refill: 6  Follow-up in one week if no improvement.

## 2015-09-17 NOTE — Patient Instructions (Signed)
Rest, stay hydrated, antibiotic, flonase and prednisone.   Acute Bronchitis Bronchitis is inflammation of the airways that extend from the windpipe into the lungs (bronchi). The inflammation often causes mucus to develop. This leads to a cough, which is the most common symptom of bronchitis.  In acute bronchitis, the condition usually develops suddenly and goes away over time, usually in a couple weeks. Smoking, allergies, and asthma can make bronchitis worse. Repeated episodes of bronchitis may cause further lung problems.  CAUSES Acute bronchitis is most often caused by the same virus that causes a cold. The virus can spread from person to person (contagious) through coughing, sneezing, and touching contaminated objects. SIGNS AND SYMPTOMS   Cough.   Fever.   Coughing up mucus.   Body aches.   Chest congestion.   Chills.   Shortness of breath.   Sore throat.  DIAGNOSIS  Acute bronchitis is usually diagnosed through a physical exam. Your health care provider will also ask you questions about your medical history. Tests, such as chest X-rays, are sometimes done to rule out other conditions.  TREATMENT  Acute bronchitis usually goes away in a couple weeks. Oftentimes, no medical treatment is necessary. Medicines are sometimes given for relief of fever or cough. Antibiotic medicines are usually not needed but may be prescribed in certain situations. In some cases, an inhaler may be recommended to help reduce shortness of breath and control the cough. A cool mist vaporizer may also be used to help thin bronchial secretions and make it easier to clear the chest.  HOME CARE INSTRUCTIONS  Get plenty of rest.   Drink enough fluids to keep your urine clear or pale yellow (unless you have a medical condition that requires fluid restriction). Increasing fluids may help thin your respiratory secretions (sputum) and reduce chest congestion, and it will prevent dehydration.   Take  medicines only as directed by your health care provider.  If you were prescribed an antibiotic medicine, finish it all even if you start to feel better.  Avoid smoking and secondhand smoke. Exposure to cigarette smoke or irritating chemicals will make bronchitis worse. If you are a smoker, consider using nicotine gum or skin patches to help control withdrawal symptoms. Quitting smoking will help your lungs heal faster.   Reduce the chances of another bout of acute bronchitis by washing your hands frequently, avoiding people with cold symptoms, and trying not to touch your hands to your mouth, nose, or eyes.   Keep all follow-up visits as directed by your health care provider.  SEEK MEDICAL CARE IF: Your symptoms do not improve after 1 week of treatment.  SEEK IMMEDIATE MEDICAL CARE IF:  You develop an increased fever or chills.   You have chest pain.   You have severe shortness of breath.  You have bloody sputum.   You develop dehydration.  You faint or repeatedly feel like you are going to pass out.  You develop repeated vomiting.  You develop a severe headache. MAKE SURE YOU:   Understand these instructions.  Will watch your condition.  Will get help right away if you are not doing well or get worse.   This information is not intended to replace advice given to you by your health care provider. Make sure you discuss any questions you have with your health care provider.   Document Released: 11/26/2004 Document Revised: 11/09/2014 Document Reviewed: 04/11/2013 Elsevier Interactive Patient Education [employer].

## 2015-10-03 ENCOUNTER — Emergency Department
Admission: EM | Admit: 2015-10-03 | Discharge: 2015-10-03 | Disposition: A | Discharge status: Home / Self Care | Payer: BLUE CROSS/BLUE SHIELD | Source: Home / Self Care | Attending: Family Medicine | Admitting: Family Medicine

## 2015-10-03 ENCOUNTER — Encounter: Payer: Self-pay | Admitting: *Deleted

## 2015-10-03 DIAGNOSIS — J029 Acute pharyngitis, unspecified: Secondary | ICD-10-CM | POA: Diagnosis not present

## 2015-10-03 LAB — POCT RAPID STREP A (OFFICE): RAPID STREP A SCREEN: NEGATIVE

## 2015-10-03 MED ORDER — AZITHROMYCIN 250 MG PO TABS: ORAL_TABLET | ORAL | Status: DC

## 2015-10-03 NOTE — ED Provider Notes (Signed)
CSN: [id number]     Arrival date & time 10/03/15  1725 History   First MD Initiated Contact with Patient 10/03/15 1806     Chief Complaint  Patient presents with  . Sore Throat      HPI Comments: Patient complains of onset of soreness in his throat and anterior neck today.  He has been mildly fatigued for two days, and has had chills but no fever.  He also has become hoarse. He states that he has just recovered from bronchitis that started about 1.5 weeks ago.  The history is provided by the patient.    Past Medical History  Diagnosis Date  . GERD (gastroesophageal reflux disease)   . Allergy   . Hyperlipidemia     HDL high, LDL high: simvastatin resulted in prolonged/severe muscle pain, led to long w/u and pt wants to avoid further statin unless lipids severely worsen  . Barrett esophagus 2012  . Subclinical hypothyroidism     Patient give hx of hypfunctioning goiter in the distant past, says he was given pills to "kill" the goiter.  Says bx of goiter was benign.  . Anxiety 08/12/2012  . Atypical chest pain Fall 2013    Stress echo NORMAL 08/30/12   Past Surgical History  Procedure Laterality Date  . Esophagogastroduodenoscopy  09/2011    Dr. Olevia Perches  . 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 Cardiovascular stress test  08/2012    Stress echo normal   Family History  Problem Relation Age of Onset  . Heart disease Father     Died of MI age 26  . Colon cancer Neg Hx   . Heart disease Paternal Uncle     MI's in his 65s   Social History  Substance Use Topics  . Smoking status: Former Smoker    Types: Cigarettes    Quit date: 08/18/1979  . Smokeless tobacco: Former Systems developer    Types: Chew     Comment: has not used since age 83  . Alcohol Use: No    Review of Systems + sore throat + hoarse No cough No pleuritic pain No wheezing ? nasal congestion ? post-nasal drainage No sinus pain/pressure No itchy/red  eyes No earache No hemoptysis No SOB No fever, + chills No nausea No vomiting No abdominal pain No diarrhea No urinary symptoms No skin rash + fatigue No myalgias No headache    Allergies  Simvastatin  Home Medications   Prior to Admission medications   Medication Sig Start Date End Date Taking? Authorizing Provider  azithromycin (ZITHROMAX Z-PAK) 250 MG tablet Take 2 tabs today; then begin one tab once daily for 4 more days. (Rx void after 10/13/15) 10/03/15   Kandra Nicolas, MD  co-enzyme Q-10 30 MG capsule Take 100 mg by mouth daily. Take 2 100 mg daily     Historical Provider, MD  fluticasone (FLONASE) 50 MCG/ACT nasal spray Place 2 sprays into both nostrils daily. 09/17/15   Renee A Kuneff, DO  Krill Oil CAPS Take 1 capsule by mouth.    Historical Provider, MD  meloxicam (MOBIC) 15 MG tablet Take 15 mg by mouth daily. with food 09/05/14   Historical Provider, MD  Multiple Vitamin (ONE-A-DAY MENS PO) Take by mouth daily.      Historical Provider, MD  niacin 500 MG tablet Take 500 mg by mouth daily with breakfast.      Historical Provider,  MD  omeprazole (PRILOSEC) 20 MG capsule TAKE 1 CAPSULE (20 MG TOTAL) BY MOUTH 2 (TWO) TIMES DAILY. Patient taking differently: 20 mg. TAKE 1 CAPSULE (20 MG TOTAL) BY MOUTH once daily 02/04/15   Tammi Sou, MD   Meds Ordered and Administered this Visit  Medications - No data to display  BP 148/90 mmHg  Pulse 80  Temp(Src) 98.3 F (36.8 C) (Oral)  Resp 14  Ht 6\' 1"  (1.854 m)  Wt 161 lb (73.029 kg)  BMI 21.25 kg/m2  SpO2 96% No data found.   Physical Exam Nursing notes and Vital Signs reviewed. Appearance:  Patient appears stated age, and in no acute distress Eyes:  Pupils are equal, round, and reactive to light and accomodation.  Extraocular movement is intact.  Conjunctivae are not inflamed  Ears:  Canals normal.  Tympanic membranes normal.  Nose:  Mildly congested turbinates.  No sinus tenderness.    Pharynx:  Uvula  slightly erythematous and edematous Neck:  Supple.   Tender shotty tonsillar nodes palpated.  Enlarged non-tender posterior nodes are palpated bilaterally  Lungs:  Clear to auscultation.  Breath sounds are equal.  Moving air well. Heart:  Regular rate and rhythm without murmurs, rubs, or gallops.  Abdomen:  Nontender without masses or hepatosplenomegaly.  Bowel sounds are present.  No CVA or flank tenderness.  Extremities:  No edema.    Skin:  No rash present.   ED Course  Procedures  None    Labs Reviewed  STREP A DNA PROBE  POCT RAPID STREP A (OFFICE) negative      MDM   1. Acute pharyngitis, unspecified etiology; suspect early viral URI    Throat culture pending.  If increasing cold symptoms develop, try the following: Take plain guaifenesin (1200mg  extended release tabs such as Mucinex) twice daily, with plenty of water, for cough and congestion.  Get adequate rest.   May use Afrin nasal spray (or generic oxymetazoline) twice daily for about 5 days and then discontinue.  Also recommend using saline nasal spray several times daily and saline nasal irrigation (AYR is a common brand).   Try warm salt water gargles for sore throat.  Stop all antihistamines for now, and other non-prescription cough/cold preparations. May take Ibuprofen 200mg , 4 tabs every 8 hours with food for sore throat, headache, etc. May take Delsym Cough Suppressant at bedtime for nighttime cough.  Begin Azithromycin if not improving about one week or if persistent fever develops (given Rx to hold) Follow-up with family doctor if not improving about10 days.     Kandra Nicolas, MD 10/03/15 [phone]

## 2015-10-03 NOTE — ED Notes (Addendum)
Pt reports sore throat/neck pain that started 3 hours ago. Afebrile. Feels pain in front of neck, pinpoints to his [employer], when he turns his head and when swallowing. He just recently finished a 10 day course of Doxy and 5 days prednisone for URI.

## 2015-10-03 NOTE — Discharge Instructions (Signed)
If increasing cold symptoms develop, try the following: Take plain guaifenesin (1200mg  extended release tabs such as Mucinex) twice daily, with plenty of water, for cough and congestion.  Get adequate rest.   May use Afrin nasal spray (or generic oxymetazoline) twice daily for about 5 days and then discontinue.  Also recommend using saline nasal spray several times daily and saline nasal irrigation (AYR is a common brand).   Try warm salt water gargles for sore throat.  Stop all antihistamines for now, and other non-prescription cough/cold preparations. May take Ibuprofen 200mg , 4 tabs every 8 hours with food for sore throat, headache, etc. May take Delsym Cough Suppressant at bedtime for nighttime cough.  Begin Azithromycin if not improving about one week or if persistent fever develops  Follow-up with family doctor if not improving about10 days.

## 2015-10-04 ENCOUNTER — Telehealth: Payer: Self-pay | Admitting: *Deleted

## 2015-10-04 LAB — STREP A DNA PROBE: GASP: NOT DETECTED

## 2015-10-29 ENCOUNTER — Ambulatory Visit (INDEPENDENT_AMBULATORY_CARE_PROVIDER_SITE_OTHER): Payer: BLUE CROSS/BLUE SHIELD

## 2015-10-29 DIAGNOSIS — Z23 Encounter for immunization: Secondary | ICD-10-CM

## 2016-03-17 ENCOUNTER — Telehealth: Payer: Self-pay | Admitting: Family Medicine

## 2016-03-17 NOTE — Telephone Encounter (Signed)
Patient found a small tick that was attached on Sunday. He felt weak yesterday & had diarrhea but he had a lot of salad on Sunday so it may be from that. Please advise.

## 2016-03-17 NOTE — Telephone Encounter (Signed)
Per Dr. Anitra Lauth most tick born illnesses have an 1-2 week incubation period and the symtoms he is having now is most likely from the salad. Please advise pt to give himself a few more weeks to see if his symptoms improve. Pt advised and voiced understanding.

## 2016-04-22 ENCOUNTER — Other Ambulatory Visit: Payer: Self-pay | Admitting: *Deleted

## 2016-04-22 MED ORDER — OMEPRAZOLE 20 MG PO CPDR: DELAYED_RELEASE_CAPSULE | ORAL | Status: DC

## 2016-04-22 NOTE — Telephone Encounter (Signed)
Pt advised and voiced understanding.  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

## 2016-04-22 NOTE — Telephone Encounter (Signed)
I'll do 6 mo of RF's but pt needs to arrange routine f/u visit or CPE sometime in the next 6 mo.-thx

## 2016-04-22 NOTE — Telephone Encounter (Signed)
RF request for omeprazole LOV: 11/22/13 Next ov: None Last written: 02/04/15 #180 w/ 1RF  Please advise. Thanks.

## 2016-05-20 ENCOUNTER — Ambulatory Visit (INDEPENDENT_AMBULATORY_CARE_PROVIDER_SITE_OTHER): Payer: BLUE CROSS/BLUE SHIELD | Admitting: Family Medicine

## 2016-05-20 ENCOUNTER — Encounter: Payer: Self-pay | Admitting: Family Medicine

## 2016-05-20 VITALS — BP 117/75 | HR 62 | Temp 98.6°F | Resp 16 | Ht 73.0 in | Wt 159.5 lb

## 2016-05-20 DIAGNOSIS — H9312 Tinnitus, left ear: Secondary | ICD-10-CM

## 2016-05-20 DIAGNOSIS — H6982 Other specified disorders of Eustachian tube, left ear: Secondary | ICD-10-CM | POA: Diagnosis not present

## 2016-05-20 NOTE — Progress Notes (Signed)
Pre visit review using our clinic review tool, if applicable. No additional management support is needed unless otherwise documented below in the visit note. 

## 2016-05-20 NOTE — Progress Notes (Signed)
OFFICE VISIT  05/20/2016   CC:  Chief Complaint  Patient presents with  . Tinnitus    left ear with some pain x 2-3 weeks   HPI:    Patient is a 50 y.o. Caucasian male who presents for 2-3 week history of ringing in L ear with mild sensation of impaired hearing.  Seems constant but with intermittent periods in which he notes it to be worse.  Slight dull pain around base of L ear.    Some recent mild pain in throat with swallowing + sensation of possible swollen lymph node in left side of neck. He notes these sx's are resolving, though.  No ear drainage. No fever.  Has mild allergic rhinitis.  Uses flonase daily + neti pot qod.   Past Medical History  Diagnosis Date  . GERD (gastroesophageal reflux disease)   . Allergy   . Hyperlipidemia     HDL high, LDL high: simvastatin resulted in prolonged/severe muscle pain, led to long w/u and pt wants to avoid further statin unless lipids severely worsen  . Barrett esophagus 2012  . Subclinical hypothyroidism     Patient give hx of hypfunctioning goiter in the distant past, says he was given pills to "kill" the goiter.  Says bx of goiter was benign.  . Anxiety 08/12/2012  . Atypical chest pain Fall 2013    Stress echo NORMAL 08/30/12    Past Surgical History  Procedure Laterality Date  . Esophagogastroduodenoscopy  09/2011    Dr. Olevia Perches  . 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 Cardiovascular stress test  08/2012    Stress echo normal    Outpatient Prescriptions Prior to Visit  Medication Sig Dispense Refill  . co-enzyme Q-10 30 MG capsule Take 100 mg by mouth daily. Take 2 100 mg daily     . fluticasone (FLONASE) 50 MCG/ACT nasal spray Place 2 sprays into both nostrils daily. 16 g 6  . Krill Oil CAPS Take 1 capsule by mouth.    . Multiple Vitamin (ONE-A-DAY MENS PO) Take by mouth daily.      . niacin 500 MG tablet Take 500 mg by mouth daily with breakfast.      .  omeprazole (PRILOSEC) 20 MG capsule TAKE 1 CAPSULE (20 MG TOTAL) BY MOUTH 2 (TWO) TIMES DAILY. 180 capsule 0  . azithromycin (ZITHROMAX Z-PAK) 250 MG tablet Take 2 tabs today; then begin one tab once daily for 4 more days. (Rx void after 10/13/15) (Patient not taking: Reported on 05/20/2016) 6 tablet 0  . meloxicam (MOBIC) 15 MG tablet Take 15 mg by mouth daily. Reported on 05/20/2016  1   No facility-administered medications prior to visit.    Allergies  Allergen Reactions  . Simvastatin Other (See Comments)    myalgias    ROS As per HPI  PE: Blood pressure 117/75, pulse 62, temperature 98.6 F (37 C), temperature source Oral, resp. rate 16, height 6\' 1"  (1.854 m), weight 159 lb 8 oz (72.349 kg), SpO2 96 %. Gen: Alert, well appearing.  Patient is oriented to person, place, time, and situation. ENT: Ears: EACs clear, normal epithelium.  TMs with good light reflex and landmarks bilaterally.  Eyes: no injection, icteris, swelling, or exudate.  EOMI, PERRLA. Nose: no drainage or turbinate edema/swelling.  No injection or focal lesion.  Mouth: lips without lesion/swelling.  Oral mucosa pink and moist.  Dentition intact and without obvious  caries or gingival swelling.  Oropharynx without erythema, exudate, or swelling.  Neck - No masses or thyromegaly or limitation in range of motion   LABS:   Hearing Screening   125Hz  250Hz  500Hz  1000Hz  2000Hz  4000Hz  8000Hz   Right ear:   20 20 20 20    Left ear:   20 20 20 20      IMPRESSION AND PLAN:  Eustachian tube dysfunction, left: pt already taking daily nasal steroid, using saline nasal rinses qod. I have nothing further to recommend, esp since his nasal/allergy symptoms seem to be minimal at this point.  I reassured him that his exam is normal and his hearing testing was normal.  I recommended he see his ENT (Dr. Benjamine Mola) for further eval and he said he would.  An After Visit Summary was printed and given to the patient.  FOLLOW UP: Return if  symptoms worsen or fail to improve.  Signed:  Crissie Sickles, MD           05/20/2016

## 2016-08-10 ENCOUNTER — Ambulatory Visit: Payer: BLUE CROSS/BLUE SHIELD | Admitting: Family Medicine

## 2016-08-10 ENCOUNTER — Ambulatory Visit (INDEPENDENT_AMBULATORY_CARE_PROVIDER_SITE_OTHER): Payer: BLUE CROSS/BLUE SHIELD | Admitting: Family Medicine

## 2016-08-10 ENCOUNTER — Encounter: Payer: Self-pay | Admitting: Family Medicine

## 2016-08-10 VITALS — BP 121/84 | HR 83 | Temp 98.1°F | Resp 20 | Wt 163.5 lb

## 2016-08-10 DIAGNOSIS — J0100 Acute maxillary sinusitis, unspecified: Secondary | ICD-10-CM

## 2016-08-10 DIAGNOSIS — K219 Gastro-esophageal reflux disease without esophagitis: Secondary | ICD-10-CM

## 2016-08-10 MED ORDER — DOXYCYCLINE HYCLATE 100 MG PO TABS: 100.0000 mg | ORAL_TABLET | Freq: Two times a day (BID) | ORAL | 0 refills | Status: DC

## 2016-08-10 NOTE — Patient Instructions (Addendum)
Rest, hydrate, mucinex, flonase.  Make sure to clean nettie pot well, and flonase tip.  Start doxycyline BID.    Sinusitis, Adult Sinusitis is redness, soreness, and inflammation of the paranasal sinuses. Paranasal sinuses are air pockets within the bones of your face. They are located beneath your eyes, in the middle of your forehead, and above your eyes. In healthy paranasal sinuses, mucus is able to drain out, and air is able to circulate through them by way of your nose. However, when your paranasal sinuses are inflamed, mucus and air can become trapped. This can allow bacteria and other germs to grow and cause infection. Sinusitis can develop quickly and last only a short time (acute) or continue over a long period (chronic). Sinusitis that lasts for more than 12 weeks is considered chronic. CAUSES Causes of sinusitis include:  Allergies.  Structural abnormalities, such as displacement of the cartilage that separates your nostrils (deviated septum), which can decrease the air flow through your nose and sinuses and affect sinus drainage.  Functional abnormalities, such as when the small hairs (cilia) that line your sinuses and help remove mucus do not work properly or are not present. SIGNS AND SYMPTOMS Symptoms of acute and chronic sinusitis are the same. The primary symptoms are pain and pressure around the affected sinuses. Other symptoms include:  Upper toothache.  Earache.  Headache.  Bad breath.  Decreased sense of smell and taste.  A cough, which worsens when you are lying flat.  Fatigue.  Fever.  Thick drainage from your nose, which often is green and may contain pus (purulent).  Swelling and warmth over the affected sinuses. DIAGNOSIS Your health care provider will perform a physical exam. During your exam, your health care provider may perform any of the following to help determine if you have acute sinusitis or chronic sinusitis:  Look in your nose for signs of  abnormal growths in your nostrils (nasal polyps).  Tap over the affected sinus to check for signs of infection.  View the inside of your sinuses using an imaging device that has a light attached (endoscope). If your health care provider suspects that you have chronic sinusitis, one or more of the following tests may be recommended:  Allergy tests.  Nasal culture. A sample of mucus is taken from your nose, sent to a lab, and screened for bacteria.  Nasal cytology. A sample of mucus is taken from your nose and examined by your health care provider to determine if your sinusitis is related to an allergy. TREATMENT Most cases of acute sinusitis are related to a viral infection and will resolve on their own within 10 days. Sometimes, medicines are prescribed to help relieve symptoms of both acute and chronic sinusitis. These may include pain medicines, decongestants, nasal steroid sprays, or saline sprays. However, for sinusitis related to a bacterial infection, your health care provider will prescribe antibiotic medicines. These are medicines that will help kill the bacteria causing the infection. Rarely, sinusitis is caused by a fungal infection. In these cases, your health care provider will prescribe antifungal medicine. For some cases of chronic sinusitis, surgery is needed. Generally, these are cases in which sinusitis recurs more than 3 times per year, despite other treatments. HOME CARE INSTRUCTIONS  Drink plenty of water. Water helps thin the mucus so your sinuses can drain more easily.  Use a humidifier.  Inhale steam 3-4 times a day (for example, sit in the bathroom with the shower running).  Apply a warm, moist washcloth  to your face 3-4 times a day, or as directed by your health care provider.  Use saline nasal sprays to help moisten and clean your sinuses.  Take medicines only as directed by your health care provider.  If you were prescribed either an antibiotic or antifungal  medicine, finish it all even if you start to feel better. SEEK IMMEDIATE MEDICAL CARE IF:  You have increasing pain or severe headaches.  You have nausea, vomiting, or drowsiness.  You have swelling around your face.  You have vision problems.  You have a stiff neck.  You have difficulty breathing.   This information is not intended to replace advice given to you by your health care provider. Make sure you discuss any questions you have with your health care provider.   Document Released: 10/19/2005 Document Revised: 11/09/2014 Document Reviewed: 11/03/2011 Elsevier Interactive Patient Education [employer].

## 2016-08-10 NOTE — Progress Notes (Signed)
Tyler Schmidt , 1965-12-18, 50 y.o., male MRN: [id number] Patient Care Team    Relationship Specialty Notifications Start End  Tammi Sou, MD PCP - General Family Medicine  09/28/11   Romeo Apple, MD (Inactive) Consulting Physician Cardiology  10/03/11   Lafayette Dragon, MD (Inactive) Consulting Physician Gastroenterology  10/03/11     CC: sinusitis Subjective: Pt presents for an acute OV with complaints of sinus pressure of > 2 weeks weeks duration.  Associated symptoms include feeling run down, nasal drainage, sinus pressure, PND, ear pressure, teeth hurt.   He had a z-pack "left-over" took it about 3 weeks ago, and it went away for a week, but since has been progressively worsening for 2 weeks. He does take flonase and use nettie pot, however he does not clean his nettie pot.   He also mentions his GERD is worse over the last few months. He currently take omeprazole 20 mg and has bene on this medication for about 4 or more years. He does not follow a GERD diet. He has had EGD performed and had barrett's esophagus, which he states last check it had resolved and biopsies negative.   Allergies  Allergen Reactions  . Simvastatin Other (See Comments)    myalgias   Social History  Substance Use Topics  . Smoking status: Former Smoker    Types: Cigarettes    Quit date: 08/18/1979  . Smokeless tobacco: Former Systems developer    Types: Chew     Comment: has not used since age 59  . Alcohol use No   Past Medical History:  Diagnosis Date  . Allergy   . Anxiety 08/12/2012  . Atypical chest pain Fall 2013   Stress echo NORMAL 08/30/12  . Barrett esophagus 2012  . GERD (gastroesophageal reflux disease)   . Hyperlipidemia    HDL high, LDL high: simvastatin resulted in prolonged/severe muscle pain, led to long w/u and pt wants to avoid further statin unless lipids severely worsen  . Subclinical hypothyroidism    Patient give hx of hypfunctioning goiter in the distant past, says he was  given pills to "kill" the goiter.  Says bx of goiter was benign.   Past Surgical History:  Procedure Laterality Date  . 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 CARDIOVASCULAR STRESS TEST  08/2012   Stress echo normal  . ESOPHAGOGASTRODUODENOSCOPY  09/2011   Dr. Olevia Perches   Family History  Problem Relation Age of Onset  . Heart disease Father     Died of MI age 62  . Heart disease Paternal Uncle     MI's in his 62s  . Colon cancer Neg Hx      Medication List       Accurate as of 08/10/16  2:45 PM. Always use your most recent med list.          co-enzyme Q-10 30 MG capsule Take 100 mg by mouth daily. Take 2 100 mg daily   fluticasone 50 MCG/ACT nasal spray Commonly known as:  FLONASE Place 2 sprays into both nostrils daily.   Krill Oil Caps Take 1 capsule by mouth.   niacin 500 MG tablet Take 500 mg by mouth daily with breakfast.   omeprazole 20 MG capsule Commonly known as:  PRILOSEC TAKE 1 CAPSULE (20 MG TOTAL) BY MOUTH 2 (TWO) TIMES DAILY.   ONE-A-DAY MENS PO Take by mouth daily.  No results found for this or any previous visit (from the past 24 hour(s)). No results found.   ROS: Negative, with the exception of above mentioned in HPI   Objective:  BP 121/84 (BP Location: Right Arm, Patient Position: Sitting, Cuff Size: Normal)   Pulse 83   Temp 98.1 F (36.7 C)   Resp 20   Wt 163 lb 8 oz (74.2 kg)   SpO2 97%   BMI 21.57 kg/m  Body mass index is 21.57 kg/m. Gen: Afebrile. No acute distress. Nontoxic in appearance, well developed, well nourished. caucasian male.  HENT: AT. [place]. Bilateral TM visualized normal in appearance . MMM, no oral lesions. Bilateral nares with erythema, no swelling.Throat without erythema or exudates. PND, cough, TTP max sinus.  Eyes:Pupils Equal Round Reactive to light, Extraocular movements intact,  Conjunctiva without redness, discharge or  icterus. Neck/lymp/endocrine: Supple,no lymphadenopathy CV: RRR, no edema Chest: CTAB, no wheeze or crackles. Good air movement, normal resp effort.  Abd: Soft. NTND. BS present.  Skin: no rashes, purpura or petechiae.  Neuro: Normal gait. PERLA. EOMi. Alert. Oriented x3   Assessment/Plan: Ladarrell Rebolledo is a 50 y.o. male present for acute OV for  Acute non-recurrent maxillary sinusitis - rest, hydrate, flonase, mucinex, nettie pot.  - Make sure to cleanse nettie pot between use.  - Doxycyline BID - F/u PRN  Gastroesophageal reflux disease without esophagitis - GERD diet, avoid laying flat at least 3 hours after eating . - increase omeprazole to 40 mg for 4 weeks.  - f/u 4 weeks.   > 25 minutes spent with patient, >50% of time spent face to face counseling patient and coordinating care.   electronically signed by:  Howard Pouch, DO  [place]

## 2016-08-13 ENCOUNTER — Telehealth: Payer: Self-pay | Admitting: Family Medicine

## 2016-08-13 NOTE — Telephone Encounter (Signed)
Spoke with patient explained to patient it may take some time before an improvement  he needs to continue antibiotic and other directions reviewed with him at his office visit . Per Dr Raoul Pitch follow up with PCP if no improvement after completion of antibiotic. Patient verbalized understanding

## 2016-08-13 NOTE — Telephone Encounter (Signed)
Patient states he felt like he had a fever last night(didn't have access to thermometer). He is having a lot of fatigue and body aches. He hasn't felt any better since starting the antibiotic. What do you recommend? He is concerned since this has been going on for 3 weeks.

## 2016-08-17 ENCOUNTER — Telehealth: Payer: Self-pay | Admitting: *Deleted

## 2016-08-17 MED ORDER — PREDNISONE 50 MG PO TABS: 50.0000 mg | ORAL_TABLET | Freq: Every day | ORAL | 0 refills | Status: DC

## 2016-08-17 NOTE — Telephone Encounter (Signed)
If he completed the doxycyline and he is not feverish, then he is no longer is infectious. Finish the antibiotics to completion.  I have called a steroid burst, this can help with the inflammatory aspect of sinusitis. If neither of the above works or he is feverish, then he needs to be seen.

## 2016-08-17 NOTE — Telephone Encounter (Signed)
Patient called and states he still has had no improvement in his symptoms . He has been on antibiotics for 8 days . Patient states he needs something to clear this up right away because he travels for his job. He states he will be out of town next week so needs this taken care of this week. Please advise.

## 2016-08-17 NOTE — Telephone Encounter (Signed)
Spoke with patient reviewed instructions.

## 2016-08-21 DIAGNOSIS — J343 Hypertrophy of nasal turbinates: Secondary | ICD-10-CM | POA: Diagnosis not present

## 2016-08-21 DIAGNOSIS — J310 Chronic rhinitis: Secondary | ICD-10-CM | POA: Diagnosis not present

## 2016-08-21 DIAGNOSIS — J0101 Acute recurrent maxillary sinusitis: Secondary | ICD-10-CM | POA: Diagnosis not present

## 2016-08-21 DIAGNOSIS — J342 Deviated nasal septum: Secondary | ICD-10-CM | POA: Diagnosis not present

## 2016-09-03 ENCOUNTER — Encounter: Payer: Self-pay | Admitting: Family Medicine

## 2016-09-03 ENCOUNTER — Ambulatory Visit (INDEPENDENT_AMBULATORY_CARE_PROVIDER_SITE_OTHER): Payer: BLUE CROSS/BLUE SHIELD | Admitting: Family Medicine

## 2016-09-03 VITALS — BP 128/84 | HR 84 | Temp 98.2°F | Wt 158.2 lb

## 2016-09-03 DIAGNOSIS — R002 Palpitations: Secondary | ICD-10-CM

## 2016-09-03 NOTE — Patient Instructions (Signed)
Labs before you go  Would be proactive and schedule next available with Dr. Anitra Lauth that works for your schedule  If continued palpitations with family history- may need updated stress test  If chest pain or shortness of breath seek care immediately

## 2016-09-03 NOTE — Progress Notes (Signed)
Subjective:  Tyler Schmidt is a 50 y.o. year old very pleasant male patient who presents for/with See problem oriented charting ROS- No chest pain or shortness of breath. No headache or blurry vision. Some anxiety with palpitations. No unintentional weight changes. No constipation or diarrhea. Not feeling hot or cold in comparison others. see any ROS included in HPI as well.   Past Medical History-  Patient Active Problem List   Diagnosis Date Noted  . Acute bronchitis 09/17/2015  . Other fatigue 10/04/2014  . Acute pharyngitis 04/12/2014  . Hyperlipidemia 11/30/2013  . GERD (gastroesophageal reflux disease) 11/30/2013  . Neck pain 08/12/2012  . Anxiety 08/12/2012  . Reflux 08/12/2012  . Atypical chest pain 07/20/2012  . Subclinical hypothyroidism   . Mass of left side of neck 12/18/2011    Medications- reviewed and updated Current Outpatient Prescriptions  Medication Sig Dispense Refill  . co-enzyme Q-10 30 MG capsule Take 100 mg by mouth daily. Take 2 100 mg daily     . fluticasone (FLONASE) 50 MCG/ACT nasal spray Place 2 sprays into both nostrils daily. 16 g 6  . Krill Oil CAPS Take 1 capsule by mouth.    . Multiple Vitamin (ONE-A-DAY MENS PO) Take by mouth daily.      . niacin 500 MG tablet Take 500 mg by mouth daily with breakfast.      . omeprazole (PRILOSEC) 20 MG capsule TAKE 1 CAPSULE (20 MG TOTAL) BY MOUTH 2 (TWO) TIMES DAILY. 180 capsule 0   No current facility-administered medications for this visit.     Objective: BP 128/84 (BP Location: Left Arm, Patient Position: Sitting, Cuff Size: Large)   Pulse 84   Temp 98.2 F (36.8 C) (Oral)   Wt 158 lb 3.2 oz (71.8 kg)   SpO2 96%   BMI 20.87 kg/m  Gen: NAD CV: RRR no murmurs rubs or gallops No ectopic beats noted Lungs: CTAB no crackles, wheeze, rhonchi Ext: no edema Skin: warm, dry, no rash Psych: slightly anxious appearing  EKG: sinus rhythm, rate 83, normal axis, normal intrvals, no hypertorphy. No  significant st or t wave changes.   Assessment/Plan:  Palpitations  S: for 3-4 days has felt an occasional skipped beat. Sometimes heart beat fasts then skips sometimes is normal rate but will feel a skip.  Feels multiple times an hour.   States he is concerned in particular in the setting that he has not felt well all summer and now into the fall.  Thyroid issues in 20s-30s- does not remember if it was hyper or hypothyroidism but thinks it was hyperthyroidism. Feels mildly shaky at times and wonders if this could be his thyroid. Has not exercised all summer because he hasn't felt well- caught a cold early in the summer and turned into sinus stuff with severe fatigue- cleared up for a few weeks then symptoms came back and felt feverish and didn't have a fever, later treated with doxycycline. Later took augmentin through ENT as well as prednisone. Still feeling some sinus pressure but no longer having drainage, ear fullness and some mild dental discomfort on upper teeth. Going back to ENT on Wednesday.   A lot of stressors with work and caring for mother.   No hypertension, mild hyperlipidemia, never smoker. Father died of MI 53- family think sit was likely blood clot but paternal uncle also with MI in 66s. Both used tobacco products. Had stress test at age 82 and reassuring  A/P: Palpitations of unclear etiology. Not picked  up on EKG. We will check basic labs including Cbc, bmp, tsh particularly with his history of thyroid issues. Really no other symptoms except some anxiety, mild shakiness. With all his stressors, suspect anxiety is cause most likely. Discussed if continued issues- would follow up with Dr. Anitra Lauth next week. With his family history would have low threshold for stress testing despite reassuring exercise EKG stress test 3 years ago per patient. I told him did not think his sinus issues were the cause of his palpitations likely. Patient asks about anxiety treatments such as over the  counter options, prn meds, daily medications- discussed these options but would prefer him make this longer term decision with aid of PCP   Patient required extended counseling about potential etiologies, reason for my desire to have him follow up closely with PCP, discussion on sinus symptoms and interplay with palpitations, reviewing medication options for anxiety. More urgent/strict return precautions advised.   The duration of face-to-face time during this visit was greater than 25 minutes. Greater than 50% of this time was spent in counseling, explanation of diagnosis, planning of further management as discussed above   Orders Placed This Encounter  Procedures  . CBC    [place]  . Basic metabolic panel    [place]  . TSH    [place]  . EKG 12-Lead    Order Specific Question:   Where should this test be performed    Answer:   Other    Garret Reddish, MD

## 2016-09-03 NOTE — Progress Notes (Signed)
Pre visit review using our clinic review tool, if applicable. No additional management support is needed unless otherwise documented below in the visit note. 

## 2016-09-04 LAB — BASIC METABOLIC PANEL
BUN: 15 mg/dL (ref 6–23)
CALCIUM: 9.9 mg/dL (ref 8.4–10.5)
CO2: 27 meq/L (ref 19–32)
CREATININE: 1.03 mg/dL (ref 0.40–1.50)
Chloride: 102 mEq/L (ref 96–112)
GFR: 81.71 mL/min (ref >60.00)
Glucose, Bld: 92 mg/dL (ref 70–99)
Potassium: 4.1 mEq/L (ref 3.5–5.1)
SODIUM: 140 meq/L (ref 135–145)

## 2016-09-04 LAB — CBC
HEMATOCRIT: 45.2 % (ref 39.0–52.0)
HEMOGLOBIN: 15.5 g/dL (ref 13.0–17.0)
MCHC: 34.3 g/dL (ref 30.0–36.0)
MCV: 86.8 fl (ref 78.0–100.0)
Platelets: 245 10*3/uL (ref 150.0–400.0)
RBC: 5.21 Mil/uL (ref 4.22–5.81)
RDW: 13.7 % (ref 11.5–15.5)
WBC: 9.8 10*3/uL (ref 4.0–10.5)

## 2016-09-04 LAB — TSH: TSH: 7.37 u[IU]/mL — AB (ref 0.35–4.50)

## 2016-09-05 ENCOUNTER — Encounter: Payer: Self-pay | Admitting: Family Medicine

## 2016-09-09 ENCOUNTER — Other Ambulatory Visit: Payer: Self-pay | Admitting: Family Medicine

## 2016-09-09 ENCOUNTER — Ambulatory Visit (INDEPENDENT_AMBULATORY_CARE_PROVIDER_SITE_OTHER): Payer: BLUE CROSS/BLUE SHIELD | Admitting: Family Medicine

## 2016-09-09 ENCOUNTER — Encounter: Payer: Self-pay | Admitting: Family Medicine

## 2016-09-09 VITALS — BP 140/87 | HR 74 | Temp 98.0°F | Resp 18 | Wt 155.1 lb

## 2016-09-09 DIAGNOSIS — R946 Abnormal results of thyroid function studies: Secondary | ICD-10-CM

## 2016-09-09 DIAGNOSIS — J310 Chronic rhinitis: Secondary | ICD-10-CM | POA: Diagnosis not present

## 2016-09-09 DIAGNOSIS — R7989 Other specified abnormal findings of blood chemistry: Secondary | ICD-10-CM

## 2016-09-09 DIAGNOSIS — J342 Deviated nasal septum: Secondary | ICD-10-CM | POA: Diagnosis not present

## 2016-09-09 DIAGNOSIS — J320 Chronic maxillary sinusitis: Secondary | ICD-10-CM | POA: Diagnosis not present

## 2016-09-09 DIAGNOSIS — H9312 Tinnitus, left ear: Secondary | ICD-10-CM | POA: Diagnosis not present

## 2016-09-09 DIAGNOSIS — J343 Hypertrophy of nasal turbinates: Secondary | ICD-10-CM | POA: Diagnosis not present

## 2016-09-09 DIAGNOSIS — R002 Palpitations: Secondary | ICD-10-CM

## 2016-09-09 DIAGNOSIS — H9042 Sensorineural hearing loss, unilateral, left ear, with unrestricted hearing on the contralateral side: Secondary | ICD-10-CM | POA: Diagnosis not present

## 2016-09-09 LAB — MAGNESIUM: Magnesium: 2.2 mg/dL (ref 1.5–2.5)

## 2016-09-09 LAB — T4, FREE: FREE T4: 0.88 ng/dL (ref 0.60–1.60)

## 2016-09-09 LAB — TSH: TSH: 7.71 u[IU]/mL — ABNORMAL HIGH (ref 0.35–4.50)

## 2016-09-09 MED ORDER — METOPROLOL TARTRATE 25 MG PO TABS: ORAL_TABLET | ORAL | 0 refills | Status: DC

## 2016-09-09 NOTE — Progress Notes (Signed)
OFFICE VISIT  09/09/2016   CC:  Chief Complaint  Patient presents with  . Follow-up    Heart palpitations, discuss labs (TSH)   HPI:    Patient is a 50 y.o.  male who presents for f/u from 09/03/16 visit with Dr. Yong Channel at which time he had a complaint of palpitations for the preceding 3-4 days.  EKG was unremarkable.  Labs showed mildly elevated TSH at 7.37 but no T4 or T3 was checked.  CBC and BMET were normal.  He had been, and still is having, lots of stress in his life. He is an Chief Technology Officer.  He cites some family difficulties lately.  He admits he is quite anxious lately, and goes on to say that every time he is in an anxiety-increasing situation lately he begins to feel the palpitations.  Feels the palpitations multiple times a day. He feels no dizziness, no CP, no SOB, no nausea. He is very wound up, very frustrated, says he wants something done to help the palpitations go away and says he is willing to do any testing I suggest.   Past Medical History:  Diagnosis Date  . Allergy   . Anxiety 08/12/2012  . Atypical chest pain Fall 2013   Stress echo NORMAL 08/30/12  . Barrett esophagus 2012  . GERD (gastroesophageal reflux disease)   . Hyperlipidemia    HDL high, LDL high: simvastatin resulted in prolonged/severe muscle pain, led to long w/u and pt wants to avoid further statin unless lipids severely worsen  . Subclinical hypothyroidism    Patient give hx of hypfunctioning goiter in the distant past, says he was given pills to "kill" the goiter.  Says bx of goiter was benign.    Past Surgical History:  Procedure Laterality Date  . 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 CARDIOVASCULAR STRESS TEST  08/2012   Stress echo normal  . ESOPHAGOGASTRODUODENOSCOPY  09/2011   Dr. Olevia Perches    Outpatient Medications Prior to Visit  Medication Sig Dispense Refill  . co-enzyme Q-10 30 MG capsule Take 100 mg by mouth daily.  Take 2 100 mg daily     . fluticasone (FLONASE) 50 MCG/ACT nasal spray Place 2 sprays into both nostrils daily. 16 g 6  . Krill Oil CAPS Take 1 capsule by mouth.    . Multiple Vitamin (ONE-A-DAY MENS PO) Take by mouth daily.      . niacin 500 MG tablet Take 500 mg by mouth daily with breakfast.      . omeprazole (PRILOSEC) 20 MG capsule TAKE 1 CAPSULE (20 MG TOTAL) BY MOUTH 2 (TWO) TIMES DAILY. 180 capsule 0   No facility-administered medications prior to visit.     Allergies  Allergen Reactions  . Simvastatin Other (See Comments)    myalgias    ROS As per HPI  PE: Blood pressure 140/87, pulse 74, temperature 98 F (36.7 C), temperature source Temporal, resp. rate 18, weight 155 lb 1.9 oz (70.4 kg), SpO2 97 %. Gen: Alert, well appearing.  Patient is oriented to person, place, time, and situation. AFFECT: pleasant, lucid thought and speech. [id number]: no injection, icteris, swelling, or exudate.  EOMI, PERRLA. Mouth: lips without lesion/swelling.  Oral mucosa pink and moist. Oropharynx without erythema, exudate, or swelling.  CV: RRR, no m/r/g.   LUNGS: CTA bilat, nonlabored resps, good aeration in all lung fields. EXT: no clubbing, cyanosis, or edema.    LABS:  Lab Results  Component Value Date   TSH 7.37 (H) 09/03/2016   Lab Results  Component Value Date   WBC 9.8 09/03/2016   HGB 15.5 09/03/2016   HCT 45.2 09/03/2016   MCV 86.8 09/03/2016   PLT 245.0 09/03/2016   Lab Results  Component Value Date   CREATININE 1.03 09/03/2016   BUN 15 09/03/2016   NA 140 09/03/2016   K 4.1 09/03/2016   CL 102 09/03/2016   CO2 27 09/03/2016   Lab Results  Component Value Date   ALT 18 07/20/2012   AST 17 07/20/2012   ALKPHOS 39 07/20/2012   BILITOT 1.1 07/20/2012   Lab Results  Component Value Date   CHOL 221 (H) 11/22/2013   Lab Results  Component Value Date   HDL 56.10 11/22/2013   No results found for: [facility] Lab Results  Component Value Date   TRIG 61.0  11/22/2013   Lab Results  Component Value Date   CHOLHDL 4 11/22/2013    IMPRESSION AND PLAN:  1) Palpitations: suspect sinus tach. We discussed options today and decided to start lopressor 25mg , 1 tab bid, increase to 2 tabs bid if not feeling any improvement in 3-4 days.   If no improvement with this med then we'll proceed with 24h Holter monitor for further evaluation. Of note, he has cut out all caffeine as of about 3 wks ago.  2) Elevated TSH, mild: ? Of clinical significance? Will recheck this today, along with T4 and T3.  At end of visit today pt reminded me he has been having persistent/recurrent sinusitis sx's over the last 4-6 mo and has seen an ENT about this.  He returns to the ENT this week for f/u.  An After Visit Summary was printed and given to the patient.  FOLLOW UP: Return in about 1 week (around 09/16/2016) for f/u palpitations.  Signed:  Crissie Sickles, MD           09/09/2016

## 2016-09-10 ENCOUNTER — Telehealth: Payer: Self-pay | Admitting: Family Medicine

## 2016-09-10 ENCOUNTER — Encounter: Payer: Self-pay | Admitting: Family Medicine

## 2016-09-10 LAB — T3: T3 TOTAL: 101 ng/dL (ref 76–181)

## 2016-09-10 NOTE — Telephone Encounter (Signed)
Sorry, but can you add borellia burgdorferi antibodies (Lyme) to this pt's labs?  Same dx.-thx

## 2016-09-10 NOTE — Telephone Encounter (Signed)
Lab added per Dr Anitra Lauth request.

## 2016-09-10 NOTE — Telephone Encounter (Signed)
Patient is requesting to also be tested for Lyme disease since he has been feeling so tired lately. Wants to know if it can be added to yesterday's blood draw?

## 2016-09-11 DIAGNOSIS — F419 Anxiety disorder, unspecified: Secondary | ICD-10-CM | POA: Diagnosis not present

## 2016-09-11 DIAGNOSIS — K219 Gastro-esophageal reflux disease without esophagitis: Secondary | ICD-10-CM | POA: Diagnosis not present

## 2016-09-11 DIAGNOSIS — R0789 Other chest pain: Secondary | ICD-10-CM | POA: Diagnosis not present

## 2016-09-11 DIAGNOSIS — E039 Hypothyroidism, unspecified: Secondary | ICD-10-CM | POA: Diagnosis not present

## 2016-09-11 DIAGNOSIS — R911 Solitary pulmonary nodule: Secondary | ICD-10-CM | POA: Diagnosis not present

## 2016-09-11 DIAGNOSIS — E785 Hyperlipidemia, unspecified: Secondary | ICD-10-CM | POA: Diagnosis not present

## 2016-09-11 DIAGNOSIS — R079 Chest pain, unspecified: Secondary | ICD-10-CM | POA: Diagnosis not present

## 2016-09-11 DIAGNOSIS — F438 Other reactions to severe stress: Secondary | ICD-10-CM | POA: Diagnosis not present

## 2016-09-11 DIAGNOSIS — F430 Acute stress reaction: Secondary | ICD-10-CM | POA: Diagnosis not present

## 2016-09-11 DIAGNOSIS — R002 Palpitations: Secondary | ICD-10-CM | POA: Diagnosis not present

## 2016-09-11 DIAGNOSIS — R918 Other nonspecific abnormal finding of lung field: Secondary | ICD-10-CM | POA: Diagnosis not present

## 2016-09-11 LAB — LYME AB/WESTERN BLOT REFLEX

## 2016-09-12 DIAGNOSIS — R079 Chest pain, unspecified: Secondary | ICD-10-CM | POA: Diagnosis not present

## 2016-09-12 DIAGNOSIS — F438 Other reactions to severe stress: Secondary | ICD-10-CM | POA: Diagnosis not present

## 2016-09-12 DIAGNOSIS — R918 Other nonspecific abnormal finding of lung field: Secondary | ICD-10-CM | POA: Diagnosis not present

## 2016-09-12 DIAGNOSIS — R911 Solitary pulmonary nodule: Secondary | ICD-10-CM | POA: Diagnosis not present

## 2016-09-12 DIAGNOSIS — R0789 Other chest pain: Secondary | ICD-10-CM | POA: Diagnosis not present

## 2016-09-15 ENCOUNTER — Other Ambulatory Visit (INDEPENDENT_AMBULATORY_CARE_PROVIDER_SITE_OTHER): Payer: Self-pay | Admitting: Otolaryngology

## 2016-09-15 DIAGNOSIS — J329 Chronic sinusitis, unspecified: Secondary | ICD-10-CM

## 2016-09-16 ENCOUNTER — Ambulatory Visit: Payer: BLUE CROSS/BLUE SHIELD | Admitting: Family Medicine

## 2016-09-20 ENCOUNTER — Other Ambulatory Visit: Payer: Self-pay | Admitting: Family Medicine

## 2016-09-21 ENCOUNTER — Ambulatory Visit (INDEPENDENT_AMBULATORY_CARE_PROVIDER_SITE_OTHER): Payer: BLUE CROSS/BLUE SHIELD | Admitting: Family Medicine

## 2016-09-21 ENCOUNTER — Ambulatory Visit
Admission: RE | Admit: 2016-09-21 | Discharge: 2016-09-21 | Disposition: A | Discharge status: Home / Self Care | Payer: BLUE CROSS/BLUE SHIELD | Source: Ambulatory Visit | Attending: Otolaryngology | Admitting: Otolaryngology

## 2016-09-21 ENCOUNTER — Encounter: Payer: Self-pay | Admitting: Family Medicine

## 2016-09-21 VITALS — BP 122/83 | HR 64 | Temp 98.2°F | Resp 16 | Wt 159.5 lb

## 2016-09-21 DIAGNOSIS — R002 Palpitations: Secondary | ICD-10-CM

## 2016-09-21 DIAGNOSIS — Z23 Encounter for immunization: Secondary | ICD-10-CM

## 2016-09-21 DIAGNOSIS — J329 Chronic sinusitis, unspecified: Secondary | ICD-10-CM | POA: Diagnosis not present

## 2016-09-21 DIAGNOSIS — K219 Gastro-esophageal reflux disease without esophagitis: Secondary | ICD-10-CM

## 2016-09-21 MED ORDER — METOPROLOL SUCCINATE ER 50 MG PO TB24: 50.0000 mg | ORAL_TABLET | Freq: Every day | ORAL | 6 refills | Status: DC

## 2016-09-21 NOTE — Patient Instructions (Signed)
Increase your omeprazole to 40 mg once a day.  Stop your lopressor.  Start metoprolol ER 50mg  once daily.

## 2016-09-21 NOTE — Progress Notes (Signed)
Pre visit review using our clinic review tool, if applicable. No additional management support is needed unless otherwise documented below in the visit note. 

## 2016-09-21 NOTE — Progress Notes (Signed)
OFFICE VISIT  09/21/2016   CC:  Chief Complaint  Patient presents with  . Follow-up    Palpatations. Pt is not fasting  . Gastroesophageal Reflux    Pt stated that his current medication Prilosec does not seem to be helping.   HPI:    Patient is a 50 y.o. Caucasian male who presents for f/u palpitations. Last visit I started him on lopressor.  Having only rare (a dozen or so episodes) palpitations, taking lopressor 25mg  bid. He is having some spells with mild intermittent dizziness.  No vertigo.   BP check once since last visit was 130s/70s.  He had an episode of chest tightness and L arm pain since I last saw him and he says he went to [facility] and got a stress test, etc and all was normal.    Also says his GERD is not well controlled on prilosec 20mg  qd. Describes some regurg of food long after he finishes a meal.  When he lies down at night he also feels regurg.  Past Medical History:  Diagnosis Date  . Allergy   . Anxiety 08/12/2012  . Atypical chest pain Fall 2013   Stress echo NORMAL 08/30/12  . Barrett esophagus 2012  . GERD (gastroesophageal reflux disease)   . Hyperlipidemia    HDL high, LDL high: simvastatin resulted in prolonged/severe muscle pain, led to long w/u and pt wants to avoid further statin unless lipids severely worsen  . Subclinical hypothyroidism    Patient give hx of hypfunctioning goiter in the distant past, says he was given pills to "kill" the goiter.  Says bx of goiter was benign.    Past Surgical History:  Procedure Laterality Date  . 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 CARDIOVASCULAR STRESS TEST  08/2012   Stress echo normal  . ESOPHAGOGASTRODUODENOSCOPY  09/2011   Dr. Olevia Perches    Outpatient Medications Prior to Visit  Medication Sig Dispense Refill  . fluticasone (FLONASE) 50 MCG/ACT nasal spray Place 2 sprays into both nostrils daily. 16 g 6  . Krill Oil CAPS Take 1 capsule by  mouth.    . Multiple Vitamin (ONE-A-DAY MENS PO) Take by mouth daily.      . niacin 500 MG tablet Take 500 mg by mouth daily with breakfast.      . omeprazole (PRILOSEC) 20 MG capsule TAKE 1 CAPSULE (20 MG TOTAL) BY MOUTH 2 (TWO) TIMES DAILY. (Patient taking differently: 20 mg daily. TAKE 1 CAPSULE (20 MG TOTAL) BY MOUTH 2 (TWO) TIMES DAILY.) 180 capsule 0  . metoprolol tartrate (LOPRESSOR) 25 MG tablet 1-2 tabs po bid 60 tablet 0  . co-enzyme Q-10 30 MG capsule Take 100 mg by mouth daily. Take 2 100 mg daily      No facility-administered medications prior to visit.     Allergies  Allergen Reactions  . Simvastatin Other (See Comments)    myalgias    ROS As per HPI  PE: Blood pressure 122/83, pulse 64, temperature 98.2 F (36.8 C), temperature source Oral, resp. rate 16, weight 159 lb 8 oz (72.3 kg), SpO2 97 %. Gen: Alert, well appearing.  Patient is oriented to person, place, time, and situation. CV: RRR, no m/r/g.   LUNGS: CTA bilat, nonlabored resps, good aeration in all lung fields.   LABS:    Chemistry      Component Value Date/Time   NA 140 09/03/2016 1636  K 4.1 09/03/2016 1636   CL 102 09/03/2016 1636   CO2 27 09/03/2016 1636   BUN 15 09/03/2016 1636   CREATININE 1.03 09/03/2016 1636      Component Value Date/Time   CALCIUM 9.9 09/03/2016 1636   ALKPHOS 39 07/20/2012 0922   AST 17 07/20/2012 0922   ALT 18 07/20/2012 0922   BILITOT 1.1 07/20/2012 0922     Lab Results  Component Value Date   WBC 9.8 09/03/2016   HGB 15.5 09/03/2016   HCT 45.2 09/03/2016   MCV 86.8 09/03/2016   PLT 245.0 09/03/2016   Lab Results  Component Value Date   CHOL 221 (H) 11/22/2013   HDL 56.10 11/22/2013   LDLDIRECT 160.2 11/22/2013   TRIG 61.0 11/22/2013   CHOLHDL 4 11/22/2013   Lab Results  Component Value Date   TSH 7.71 (H) 09/09/2016   IMPRESSION AND PLAN:  1) Palpitations, suspect stress-induced sinus tachycardia. He is doing much better on metoprolol, but may  be having a bit of mild dizziness as side effect. No sign of hypotension or bradycardia occurring, and the dizziness does not seem to be posturally related. Will try to see how he does with a change from lopressor 25mg  bid to Toprol XL 50mg  qd.  2) GERD: he'll increase his omeprazole to 40mg  qAM.  GERD diet emphasized.    Flu vaccine given today.  An After Visit Summary was printed and given to the patient.  FOLLOW UP: Return in about 4 weeks (around 10/19/2016) for f/u palpitations.  Signed:  Crissie Sickles, MD           09/21/2016

## 2016-10-02 DIAGNOSIS — N200 Calculus of kidney: Secondary | ICD-10-CM

## 2016-10-02 HISTORY — DX: Calculus of kidney: N20.0

## 2016-10-02 HISTORY — PX: OTHER SURGICAL HISTORY: SHX169

## 2016-10-06 ENCOUNTER — Other Ambulatory Visit: Payer: Self-pay | Admitting: Family Medicine

## 2016-10-07 ENCOUNTER — Ambulatory Visit (INDEPENDENT_AMBULATORY_CARE_PROVIDER_SITE_OTHER): Payer: BLUE CROSS/BLUE SHIELD | Admitting: Family Medicine

## 2016-10-07 ENCOUNTER — Encounter: Payer: Self-pay | Admitting: Family Medicine

## 2016-10-07 VITALS — BP 113/73 | HR 70 | Temp 97.6°F | Resp 16 | Wt 158.4 lb

## 2016-10-07 DIAGNOSIS — R002 Palpitations: Secondary | ICD-10-CM | POA: Diagnosis not present

## 2016-10-07 DIAGNOSIS — R911 Solitary pulmonary nodule: Secondary | ICD-10-CM | POA: Diagnosis not present

## 2016-10-07 DIAGNOSIS — J301 Allergic rhinitis due to pollen: Secondary | ICD-10-CM | POA: Diagnosis not present

## 2016-10-07 MED ORDER — FEXOFENADINE HCL 180 MG PO TABS: 180.0000 mg | ORAL_TABLET | Freq: Every day | ORAL | 11 refills | Status: DC

## 2016-10-07 NOTE — Progress Notes (Signed)
OFFICE VISIT  10/07/2016   CC:  Chief Complaint  Patient presents with  . Follow-up    chest x-ray    HPI:    Patient is a 50 y.o. Caucasian male who presents for follow up of abnormal chest x-ray. Toprol XL: he felt more palp's in mid afternoon on this compared to lopressor bid. His dizziness has completely resolved.  His palpitations are still most often triggered by activity. However, this is not consistent.  He is now in favor of doing a holter monitor.    Also, on 09/11/16 at ED visit his CXR showed a 13mm x 5 mm nodule in apex of R lung. This needs CT chest follow up for further evaluation.  He complains of recurrent URI sx's for the last 6 mo, "ever since I got a cold that turned into a sinus infection". Describes very random, intermittent feelings of fatigue, chronic nasal congestion, eyes burning.  Last 4-5 days has had some morning time cough.  He uses flonase but not regularly.  No other allergy meds used.   Past Medical History:  Diagnosis Date  . Allergy   . Anxiety 08/12/2012  . Atypical chest pain Fall 2013   Stress echo NORMAL 08/30/12  . Barrett esophagus 2012  . GERD (gastroesophageal reflux disease)   . Hyperlipidemia    HDL high, LDL high: simvastatin resulted in prolonged/severe muscle pain, led to long w/u and pt wants to avoid further statin unless lipids severely worsen  . Subclinical hypothyroidism    Patient give hx of hypfunctioning goiter in the distant past, says he was given pills to "kill" the goiter.  Says bx of goiter was benign.    Past Surgical History:  Procedure Laterality Date  . 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 CARDIOVASCULAR STRESS TEST  08/2012   Stress echo normal.  Pt also reports normal ETT 09/2016.  Marland Kitchen ESOPHAGOGASTRODUODENOSCOPY  09/2011   Dr. Olevia Perches    Outpatient Medications Prior to Visit  Medication Sig Dispense Refill  . fluticasone (FLONASE) 50 MCG/ACT  nasal spray Place 2 sprays into both nostrils daily. 16 g 6  . Krill Oil CAPS Take 1 capsule by mouth.    . Multiple Vitamin (ONE-A-DAY MENS PO) Take by mouth daily.      . niacin 500 MG tablet Take 500 mg by mouth daily with breakfast.      . omeprazole (PRILOSEC) 20 MG capsule TAKE 1 CAPSULE (20 MG TOTAL) BY MOUTH 2 (TWO) TIMES DAILY. (Patient taking differently: 20 mg daily. TAKE 1 CAPSULE (20 MG TOTAL) BY MOUTH 2 (TWO) TIMES DAILY.) 180 capsule 0  . Ubiquinol (QUNOL COQ10/UBIQUINOL/MEGA) 100 MG CAPS Take 1 capsule by mouth daily.    . metoprolol succinate (TOPROL-XL) 50 MG 24 hr tablet Take 1 tablet (50 mg total) by mouth daily. Take with or immediately following a meal. (Patient not taking: Reported on 10/07/2016) 30 tablet 6   No facility-administered medications prior to visit.     Allergies  Allergen Reactions  . Simvastatin Other (See Comments)    myalgias    ROS As per HPI  PE: Blood pressure 113/73, pulse 70, temperature 97.6 F (36.4 C), temperature source Temporal, resp. rate 16, weight 158 lb 6.4 oz (71.8 kg), SpO2 96 %. Gen: Alert, well appearing.  Patient is oriented to person, place, time, and situation. AFFECT: pleasant, lucid thought and speech. [id number]: no injection, icteris, swelling, or  exudate.  EOMI, PERRLA. Mouth: lips without lesion/swelling.  Oral mucosa pink and moist. Oropharynx without erythema, exudate, or swelling.  CV: RRR, no m/r/g.   LUNGS: CTA bilat, nonlabored resps, good aeration in all lung fields. EXT: no clubbing, cyanosis, or edema.   LABS:    Chemistry      Component Value Date/Time   NA 140 09/03/2016 1636   K 4.1 09/03/2016 1636   CL 102 09/03/2016 1636   CO2 27 09/03/2016 1636   BUN 15 09/03/2016 1636   CREATININE 1.03 09/03/2016 1636      Component Value Date/Time   CALCIUM 9.9 09/03/2016 1636   ALKPHOS 39 07/20/2012 0922   AST 17 07/20/2012 0922   ALT 18 07/20/2012 0922   BILITOT 1.1 07/20/2012 0922      IMPRESSION AND  PLAN:  1) Palpitations: fortunately all his dizziness that was associated with this has resolved. He is doing much better taking lopressor 25mg  bid. He wants Holter monitoring now, so I ordered this and he will try to hold his lopressor for 1-2 doses while he is wearing this.  2) Pulmonary nodule: noted on 09/2016 CXR in [place] ED. F/u CT scan to further evaluate this was ordered today.  3) Recurrent fatigue and respiratory symptoms: very possibly uncontrolled allergic rhinitis. Take flonase more regularly, and I sent in rx for allegra 180mg  qd today.  An After Visit Summary was printed and given to the patient.  Spent 25 min with pt today, with >50% of this time spent in counseling and care coordination regarding the above problems.  FOLLOW UP: Return in about 1 month (around 11/07/2016) for annual CPE (fasting).  Needs TSH, free T4, and T3 total at that time in order to f/u recently dx'd subclinical hypothyroidism.  Signed:  Crissie Sickles, MD           10/07/2016

## 2016-10-07 NOTE — Progress Notes (Signed)
Pre visit review using our clinic review tool, if applicable. No additional management support is needed unless otherwise documented below in the visit note. 

## 2016-10-08 ENCOUNTER — Other Ambulatory Visit: Payer: Self-pay | Admitting: Family Medicine

## 2016-10-09 NOTE — Telephone Encounter (Signed)
RF request for metoprolol LOV: 10/07/16 Next ov: 11/06/16 Last written: 09/09/16 #60 w/ 0RF

## 2016-10-16 ENCOUNTER — Encounter: Payer: Self-pay | Admitting: Family Medicine

## 2016-10-16 ENCOUNTER — Ambulatory Visit
Admission: RE | Admit: 2016-10-16 | Discharge: 2016-10-16 | Disposition: A | Discharge status: Home / Self Care | Payer: BLUE CROSS/BLUE SHIELD | Source: Ambulatory Visit | Attending: Family Medicine | Admitting: Family Medicine

## 2016-10-16 DIAGNOSIS — R911 Solitary pulmonary nodule: Secondary | ICD-10-CM

## 2016-10-16 DIAGNOSIS — R918 Other nonspecific abnormal finding of lung field: Secondary | ICD-10-CM | POA: Diagnosis not present

## 2016-10-20 ENCOUNTER — Ambulatory Visit (INDEPENDENT_AMBULATORY_CARE_PROVIDER_SITE_OTHER): Payer: BLUE CROSS/BLUE SHIELD

## 2016-10-20 DIAGNOSIS — R002 Palpitations: Secondary | ICD-10-CM

## 2016-10-28 ENCOUNTER — Encounter: Payer: Self-pay | Admitting: Family Medicine

## 2016-11-06 ENCOUNTER — Encounter: Payer: BLUE CROSS/BLUE SHIELD | Admitting: Family Medicine

## 2016-11-30 ENCOUNTER — Other Ambulatory Visit: Payer: Self-pay | Admitting: *Deleted

## 2016-11-30 MED ORDER — OMEPRAZOLE 20 MG PO CPDR: DELAYED_RELEASE_CAPSULE | ORAL | 1 refills | Status: DC

## 2016-11-30 NOTE — Telephone Encounter (Signed)
Fax from [place].  RF request for omeprazole LOV: 09/21/16 Next ov: 12/17/16 Last written: 04/22/16 #180 w/ 0RF

## 2016-12-15 ENCOUNTER — Telehealth: Payer: Self-pay

## 2016-12-15 NOTE — Telephone Encounter (Signed)
Pre-visit call completed with  patient. 

## 2016-12-17 ENCOUNTER — Encounter: Payer: Self-pay | Admitting: Family Medicine

## 2016-12-17 ENCOUNTER — Ambulatory Visit (INDEPENDENT_AMBULATORY_CARE_PROVIDER_SITE_OTHER): Payer: BLUE CROSS/BLUE SHIELD | Admitting: Family Medicine

## 2016-12-17 VITALS — BP 118/68 | HR 62 | Temp 98.5°F | Ht 71.0 in | Wt 166.0 lb

## 2016-12-17 DIAGNOSIS — I2510 Atherosclerotic heart disease of native coronary artery without angina pectoris: Secondary | ICD-10-CM | POA: Diagnosis not present

## 2016-12-17 DIAGNOSIS — R002 Palpitations: Secondary | ICD-10-CM

## 2016-12-17 DIAGNOSIS — E785 Hyperlipidemia, unspecified: Secondary | ICD-10-CM

## 2016-12-17 DIAGNOSIS — E038 Other specified hypothyroidism: Secondary | ICD-10-CM

## 2016-12-17 DIAGNOSIS — R079 Chest pain, unspecified: Secondary | ICD-10-CM

## 2016-12-17 DIAGNOSIS — J209 Acute bronchitis, unspecified: Secondary | ICD-10-CM

## 2016-12-17 DIAGNOSIS — E039 Hypothyroidism, unspecified: Secondary | ICD-10-CM

## 2016-12-17 DIAGNOSIS — L989 Disorder of the skin and subcutaneous tissue, unspecified: Secondary | ICD-10-CM

## 2016-12-17 DIAGNOSIS — R0789 Other chest pain: Secondary | ICD-10-CM

## 2016-12-17 DIAGNOSIS — H6192 Disorder of left external ear, unspecified: Secondary | ICD-10-CM

## 2016-12-17 MED ORDER — CEFDINIR 300 MG PO CAPS
300.0000 mg | ORAL_CAPSULE | Freq: Two times a day (BID) | ORAL | 0 refills | Status: AC
Start: 2016-12-17 — End: 2016-12-27

## 2016-12-17 MED ORDER — METOPROLOL TARTRATE 25 MG PO TABS: 25.0000 mg | ORAL_TABLET | Freq: Two times a day (BID) | ORAL | 1 refills | Status: DC

## 2016-12-17 NOTE — Patient Instructions (Addendum)
NOW probiotic, vitamin D 2000 IU daily  Vitamin C 500 to 1000 mg daily, aged or black garlic , Elderberry liquid or caps, Zinc such as coldeeze or xicam Umcka helps   Hydrate 64 oz, plain Mucinex twice daily Preventive Care 40-64 Years, Male Preventive care refers to lifestyle choices and visits with your health care provider that can promote health and wellness. What does preventive care include?  A yearly physical exam. This is also called an annual well check.  Dental exams once or twice a year.  Routine eye exams. Ask your health care provider how often you should have your eyes checked.  Personal lifestyle choices, including:  Daily care of your teeth and gums.  Regular physical activity.  Eating a healthy diet.  Avoiding tobacco and drug use.  Limiting alcohol use.  Practicing safe sex.  Taking low-dose aspirin every day starting at age 499. What happens during an annual well check? The services and screenings done by your health care provider during your annual well check will depend on your age, overall health, lifestyle risk factors, and family history of disease. Counseling  Your health care provider may ask you questions about your:  Alcohol use.  Tobacco use.  Drug use.  Emotional well-being.  Home and relationship well-being.  Sexual activity.  Eating habits.  Work and work Statistician. Screening  You may have the following tests or measurements:  Height, weight, and BMI.  Blood pressure.  Lipid and cholesterol levels. These may be checked every 5 years, or more frequently if you are over 49 years old.  Skin check.  Lung cancer screening. You may have this screening every year starting at age 49 if you have a 30-pack-year history of smoking and currently smoke or have quit within the past 15 years.  Fecal occult blood test (FOBT) of the stool. You may have this test every year starting at age 499.  Flexible sigmoidoscopy or colonoscopy. You  may have a sigmoidoscopy every 5 years or a colonoscopy every 10 years starting at age 499.  Prostate cancer screening. Recommendations will vary depending on your family history and other risks.  Hepatitis C blood test.  Hepatitis B blood test.  Sexually transmitted disease (STD) testing.  Diabetes screening. This is done by checking your blood sugar (glucose) after you have not eaten for a while (fasting). You may have this done every 1-3 years. Discuss your test results, treatment options, and if necessary, the need for more tests with your health care provider. Vaccines  Your health care provider may recommend certain vaccines, such as:  Influenza vaccine. This is recommended every year.  Tetanus, diphtheria, and acellular pertussis (Tdap, Td) vaccine. You may need a Td booster every 10 years.  Varicella vaccine. You may need this if you have not been vaccinated.  Zoster vaccine. You may need this after age 49.  Measles, mumps, and rubella (MMR) vaccine. You may need at least one dose of MMR if you were born in 1957 or later. You may also need a second dose.  Pneumococcal 13-valent conjugate (PCV13) vaccine. You may need this if you have certain conditions and have not been vaccinated.  Pneumococcal polysaccharide (PPSV23) vaccine. You may need one or two doses if you smoke cigarettes or if you have certain conditions.  Meningococcal vaccine. You may need this if you have certain conditions.  Hepatitis A vaccine. You may need this if you have certain conditions or if you travel or work in places where you  may be exposed to hepatitis A.  Hepatitis B vaccine. You may need this if you have certain conditions or if you travel or work in places where you may be exposed to hepatitis B.  Haemophilus influenzae type b (Hib) vaccine. You may need this if you have certain risk factors. Talk to your health care provider about which screenings and vaccines you need and how often you need  them. This information is not intended to replace advice given to you by your health care provider. Make sure you discuss any questions you have with your health care provider. Document Released: 11/15/2015 Document Revised: 07/08/2016 Document Reviewed: 08/20/2015 Elsevier Interactive Patient Education  2017 [employer].

## 2016-12-17 NOTE — Progress Notes (Signed)
Pre visit review using our clinic review tool, if applicable. No additional management support is needed unless otherwise documented below in the visit note. 

## 2016-12-17 NOTE — Assessment & Plan Note (Addendum)
Symptoms for 6 days. 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 Started on Cefdinir and probiotics

## 2016-12-17 NOTE — Progress Notes (Signed)
Patient ID: Tyler Schmidt, male   DOB: [birth year], 51 y.o.   MRN: [id number]   Subjective:    Patient ID: Tyler Schmidt, male    DOB: 07-Jun-1966, 51 y.o.   MRN: [id number]  Chief Complaint  Patient presents with  . Establish Care  . Chest Pain   I acted as a Education administrator for Dr. Charlett Blake. Princess, RMA  HPI  Patient is in today for new patient appointment. He feels well today but he has been worried about some atypical chest pains recently. He denies any associated symptoms but does note some palpitations at times. He had 2 paternal uncles die at 39 and 11 of sudden cardiac death and his father struggled with heart disease young as well the patient believes. He has also noted a weeks worth of worsening respiratory symptoms. He has noted nasal and chest congestion. His cough is productive of yellow to green sputum. No fevers or chills but is noting some fatigue and myalgias. Denies SOB/fevers/GI or GU c/o. Taking meds as prescribed  Past Medical History:  Diagnosis Date  . Adrenal adenoma, left    Incidentaloma noted on noncontrast chest CT done to f/u lung nodule seen on chest radiograph (10/2016).  . Allergy   . Anxiety 08/12/2012  . Atypical chest pain Fall 2013   Stress echo NORMAL 08/30/12  . Barrett esophagus 2012  . GERD (gastroesophageal reflux disease)   . Hyperlipidemia    HDL high, LDL high: simvastatin resulted in prolonged/severe muscle pain, led to long w/u and pt wants to avoid further statin unless lipids severely worsen  . Nephrolithiasis 10/2016   Bilat, nonobstruct, noted on CT chest done for f/u of pulm nodule seen on chest radiograph  . Palpitations Fall/winter 2017   Improved with lopressor  . Subclinical hypothyroidism    Patient give hx of hypfunctioning goiter in the distant past, says he was given pills to "kill" the goiter.  Says bx of goiter was benign.    Past Surgical History:  Procedure Laterality Date  . 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 CARDIOVASCULAR STRESS TEST  08/2012   Stress echo normal.  Pt also reports normal ETT 09/2016.  Marland Kitchen ESOPHAGOGASTRODUODENOSCOPY  09/2011   Dr. Olevia Perches  . HOLTER MONITOR  10/2016   No pathologic arrhythmias.  Occ PVCs that occ corresponded to his palpitations.      Family History  Problem Relation Age of Onset  . Heart disease Father     Died of MI age 43, possible PE  . Heart disease Paternal Uncle     MI's in his 33s  . Heart disease Paternal Uncle   . Colon cancer Neg Hx     Social History   Social History  . Marital status: Married    Spouse name: N/A  . Number of children: 2  . Years of education: N/A   Occupational History  .  [employer]    Pilot   Social History Main Topics  . Smoking status: Former Smoker    Types: Cigarettes    Quit date: 08/18/1979  . Smokeless tobacco: Former Systems developer    Types: Chew     Comment: has not used since age 83  . Alcohol use No  . Drug use: No  . Sexual activity: Yes   Other Topics Concern  . Not on file   Social History Narrative   Married, boy/girl twins age 87yrs.   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   Originally from [place].   No T/A/Ds.   Cardiovasc exercise regularly.  Prudent diet.          Outpatient Medications Prior to Visit  Medication Sig Dispense Refill  . fexofenadine (ALLEGRA) 180 MG tablet Take 1 tablet (180 mg total) by mouth daily. 30 tablet 11  . fluticasone (FLONASE) 50 MCG/ACT nasal spray Place 2 sprays into both nostrils daily. 16 g 6  . Krill Oil CAPS Take 1 capsule by mouth.    . Multiple Vitamin (ONE-A-DAY MENS PO) Take by mouth daily.      . niacin 500 MG tablet Take 500 mg by mouth daily with breakfast.      . omeprazole (PRILOSEC) 20 MG capsule TAKE 1 CAPSULE (20 MG TOTAL) BY MOUTH 2 (TWO) TIMES DAILY. 180 capsule 1  . Ubiquinol (QUNOL COQ10/UBIQUINOL/MEGA) 100 MG CAPS Take 1 capsule by mouth daily.    . metoprolol tartrate (LOPRESSOR) 25 MG  tablet TAKE 1-2 TABS BY MOUTH TWICE A DAY  0   No facility-administered medications prior to visit.     Allergies  Allergen Reactions  . Simvastatin Other (See Comments)    myalgias    Review of Systems  Constitutional: Positive for chills and malaise/fatigue. Negative for fever.  HENT: Positive for congestion.   Eyes: Negative for blurred vision.  Respiratory: Positive for cough, sputum production and wheezing. Negative for shortness of breath.   Cardiovascular: Positive for chest pain and palpitations. Negative for leg swelling.  Gastrointestinal: Negative for vomiting.  Musculoskeletal: Positive for joint pain. Negative for back pain.  Skin: Negative for rash.  Neurological: Positive for weakness and headaches. Negative for loss of consciousness.       Objective:    Physical Exam  Constitutional: He is oriented to person, place, and time. He appears well-developed and well-nourished. No distress.  HENT:  Head: Normocephalic and atraumatic.  Small scaly patch on top of left pinna  Eyes: Conjunctivae are normal.  Neck: Normal range of motion. No thyromegaly present.  Cardiovascular: Normal rate and regular rhythm.   Pulmonary/Chest: Effort normal and breath sounds normal. He has no wheezes.  Abdominal: Soft. Bowel sounds are normal. There is no tenderness.  Musculoskeletal: Normal range of motion. He exhibits no edema or deformity.  Neurological: He is alert and oriented to person, place, and time.  Skin: Skin is warm and dry. He is not diaphoretic.  Psychiatric: He has a normal mood and affect.    BP 118/68 (BP Location: Left Arm, Patient Position: Sitting, Cuff Size: Normal)   Pulse 62   Temp 98.5 F (36.9 C) (Other (Comment))   Ht 5\' 11"  (1.803 m)   Wt 166 lb (75.3 kg)   SpO2 97%   BMI 23.15 kg/m  Wt Readings from Last 3 Encounters:  12/17/16 166 lb (75.3 kg)  10/07/16 158 lb 6.4 oz (71.8 kg)  09/21/16 159 lb 8 oz (72.3 kg)     Lab Results  Component  Value Date   WBC 9.8 09/03/2016   HGB 15.5 09/03/2016   HCT 45.2 09/03/2016   PLT 245.0 09/03/2016   GLUCOSE 83 12/17/2016   CHOL 243 (H) 12/17/2016   TRIG 280.0 (H) 12/17/2016   HDL 39.50 12/17/2016   LDLDIRECT 172.0 12/17/2016   ALT 31 12/17/2016   AST 21 12/17/2016   NA 137 12/17/2016   K 3.7 12/17/2016   CL 100 12/17/2016   CREATININE 0.99 12/17/2016   BUN 13 12/17/2016  CO2 29 12/17/2016   TSH 6.23 (H) 12/17/2016    Lab Results  Component Value Date   TSH 6.23 (H) 12/17/2016   Lab Results  Component Value Date   WBC 9.8 09/03/2016   HGB 15.5 09/03/2016   HCT 45.2 09/03/2016   MCV 86.8 09/03/2016   PLT 245.0 09/03/2016   Lab Results  Component Value Date   NA 137 12/17/2016   K 3.7 12/17/2016   CO2 29 12/17/2016   GLUCOSE 83 12/17/2016   BUN 13 12/17/2016   CREATININE 0.99 12/17/2016   BILITOT 0.4 12/17/2016   ALKPHOS 63 12/17/2016   AST 21 12/17/2016   ALT 31 12/17/2016   PROT 7.4 12/17/2016   ALBUMIN 4.3 12/17/2016   CALCIUM 9.4 12/17/2016   GFR 85.43 12/17/2016   Lab Results  Component Value Date   CHOL 243 (H) 12/17/2016   Lab Results  Component Value Date   HDL 39.50 12/17/2016   No results found for: [facility] Lab Results  Component Value Date   TRIG 280.0 (H) 12/17/2016   Lab Results  Component Value Date   CHOLHDL 6 12/17/2016   No results found for: HGBA1C     Assessment & Plan:   Problem List Items Addressed This Visit    Subclinical hypothyroidism    TSH continues to elevated although it has improved some. Will run a free T4 and consider treatment moving forward.       Relevant Medications   metoprolol tartrate (LOPRESSOR) 25 MG tablet   Other Relevant Orders   Comprehensive metabolic panel (Completed)   Atypical chest pain    With family history of early heart disease and nonspecific changes on ekg. Patient will be referred to cardiology for further consideration and seek care if symptoms return and do not resolve.        Hyperlipidemia, mild   Relevant Medications   metoprolol tartrate (LOPRESSOR) 25 MG tablet   aspirin EC 81 MG tablet   Other Relevant Orders   Lipid panel (Completed)   Acute bronchitis    Symptoms for 6 days. 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 Started on Cefdinir and probiotics      Skin lesion of left ear    Referred to dermatology for further consideration      Relevant Orders   Ambulatory referral to Dermatology   Coronary artery calcification seen on CT scan    Referred to cardiology for further consideration      Relevant Medications   metoprolol tartrate (LOPRESSOR) 25 MG tablet   aspirin EC 81 MG tablet   Other Relevant Orders   Ambulatory referral to Cardiology   TSH (Completed)    Other Visit Diagnoses    Chest pain, unspecified type    -  Primary   Relevant Orders   EKG 12-Lead (Completed)   Ambulatory referral to Cardiology   Heart palpitations       Relevant Orders   EKG 12-Lead (Completed)   Ambulatory referral to Cardiology   TSH (Completed)   Magnesium (Completed)      I have discontinued Mr. Sidener's metoprolol succinate. I have also changed his metoprolol tartrate. Additionally, I am having him start on cefdinir and aspirin EC. Lastly, I am having him maintain his niacin, Multiple Vitamin (ONE-A-DAY MENS PO), Krill Oil, fluticasone, Ubiquinol, fexofenadine, and omeprazole.  Meds ordered this encounter  Medications  . DISCONTD: metoprolol succinate (TOPROL-XL) 50 MG 24 hr tablet    Sig:  Take 1 tablet by mouth daily.    Refill:  6  . cefdinir (OMNICEF) 300 MG capsule    Sig: Take 1 capsule (300 mg total) by mouth 2 (two) times daily.    Dispense:  28 capsule    Refill:  0  . metoprolol tartrate (LOPRESSOR) 25 MG tablet    Sig: Take 1 tablet (25 mg total) by mouth 2 (two) times daily.    Dispense:  90 tablet    Refill:  1  . aspirin EC 81 MG tablet    Sig: Take 1 tablet (81 mg total) by mouth daily.     CMA served as Education administrator during this visit. History, Physical and Plan performed by medical provider. Documentation and orders reviewed and attested to.  Penni Homans, MD

## 2016-12-17 NOTE — Assessment & Plan Note (Addendum)
TSH continues to elevated although it has improved some. Will run a free T4 and consider treatment moving forward.

## 2016-12-18 LAB — COMPREHENSIVE METABOLIC PANEL
ALT: 31 U/L (ref 0–53)
AST: 21 U/L (ref 0–37)
Albumin: 4.3 g/dL (ref 3.5–5.2)
Alkaline Phosphatase: 63 U/L (ref 39–117)
BUN: 13 mg/dL (ref 6–23)
CALCIUM: 9.4 mg/dL (ref 8.4–10.5)
CHLORIDE: 100 meq/L (ref 96–112)
CO2: 29 mEq/L (ref 19–32)
Creatinine, Ser: 0.99 mg/dL (ref 0.40–1.50)
GFR: 85.43 mL/min (ref >60.00)
GLUCOSE: 83 mg/dL (ref 70–99)
POTASSIUM: 3.7 meq/L (ref 3.5–5.1)
Sodium: 137 mEq/L (ref 135–145)
Total Bilirubin: 0.4 mg/dL (ref 0.2–1.2)
Total Protein: 7.4 g/dL (ref 6.0–8.3)

## 2016-12-18 LAB — LDL CHOLESTEROL, DIRECT: Direct LDL: 172 mg/dL

## 2016-12-18 LAB — LIPID PANEL
CHOL/HDL RATIO: 6
CHOLESTEROL: 243 mg/dL — AB (ref 0–200)
HDL: 39.5 mg/dL (ref >39.00)
NONHDL: 203.66
TRIGLYCERIDES: 280 mg/dL — AB (ref 0.0–149.0)
VLDL: 56 mg/dL — ABNORMAL HIGH (ref 0.0–40.0)

## 2016-12-18 LAB — TSH: TSH: 6.23 u[IU]/mL — ABNORMAL HIGH (ref 0.35–4.50)

## 2016-12-18 LAB — MAGNESIUM: Magnesium: 2.1 mg/dL (ref 1.5–2.5)

## 2016-12-21 DIAGNOSIS — I2510 Atherosclerotic heart disease of native coronary artery without angina pectoris: Secondary | ICD-10-CM | POA: Insufficient documentation

## 2016-12-21 DIAGNOSIS — H6192 Disorder of left external ear, unspecified: Secondary | ICD-10-CM | POA: Insufficient documentation

## 2016-12-21 MED ORDER — ASPIRIN EC 81 MG PO TBEC: 81.0000 mg | DELAYED_RELEASE_TABLET | Freq: Every day | ORAL | Status: DC

## 2016-12-21 NOTE — Assessment & Plan Note (Signed)
Referred to dermatology for further consideration.  

## 2016-12-21 NOTE — Assessment & Plan Note (Signed)
Referred to cardiology for further consideration 

## 2016-12-21 NOTE — Assessment & Plan Note (Signed)
With family history of early heart disease and nonspecific changes on ekg. Patient will be referred to cardiology for further consideration and seek care if symptoms return and do not resolve.

## 2016-12-21 NOTE — Assessment & Plan Note (Signed)
Avoid offending foods, start probiotics. Do not eat large meals in late evening and consider raising head of bed.  

## 2016-12-22 ENCOUNTER — Other Ambulatory Visit: Payer: Self-pay | Admitting: Family Medicine

## 2016-12-22 MED ORDER — ATORVASTATIN CALCIUM 10 MG PO TABS: 10.0000 mg | ORAL_TABLET | Freq: Every day | ORAL | 0 refills | Status: DC

## 2017-01-01 ENCOUNTER — Encounter: Payer: Self-pay | Admitting: Cardiology

## 2017-01-04 NOTE — Progress Notes (Signed)
Referring: Shawnie Dapper, MD  HPI: 51 year old male for evaluation of palpitations. Patient seen in this office previously but not since October 2013. Stress echocardiogram 2009 normal. Stress echocardiogram October 2013 normal. Holter monitor December 2017 showed sinus rhythm with PVCs. Stress echocardiogram at [facility] November 2017 normal. Chest CT December 2017 showed coronary artery calcification. Patient states that since October he has had palpitations. They are described as a skip with associated strange feeling in his chest. He was started on metoprolol with some improvement. There is no dyspnea on exertion, orthopnea, PND, pedal edema, syncope or exertional chest pain. Because of the above we were asked to evaluate.  Current Outpatient Prescriptions  Medication Sig Dispense Refill  . atorvastatin (LIPITOR) 10 MG tablet Take 1 tablet (10 mg total) by mouth daily. (Patient taking differently: Take 10 mg by mouth 2 (two) times a week. Takes every Wednesday and Saturday.) 30 tablet 0  . fexofenadine (ALLEGRA) 180 MG tablet Take 1 tablet (180 mg total) by mouth daily. 30 tablet 11  . fluticasone (FLONASE) 50 MCG/ACT nasal spray Place 2 sprays into both nostrils daily. 16 g 6  . Krill Oil CAPS Take 1 capsule by mouth.    . metoprolol tartrate (LOPRESSOR) 25 MG tablet Take 1 tablet (25 mg total) by mouth 2 (two) times daily. 90 tablet 1  . Multiple Vitamin (ONE-A-DAY MENS PO) Take by mouth daily.      . niacin 500 MG tablet Take 500 mg by mouth daily with breakfast.      . omeprazole (PRILOSEC) 20 MG capsule TAKE 1 CAPSULE (20 MG TOTAL) BY MOUTH 2 (TWO) TIMES DAILY. 180 capsule 1  . Ubiquinol (QUNOL COQ10/UBIQUINOL/MEGA) 100 MG CAPS Take 1 capsule by mouth daily.     No current facility-administered medications for this visit.     Allergies  Allergen Reactions  . Simvastatin Other (See Comments)    myalgias     Past Medical History:  Diagnosis Date  . Adrenal adenoma,  left    Incidentaloma noted on noncontrast chest CT done to f/u lung nodule seen on chest radiograph (10/2016).  . Allergy   . Anxiety 08/12/2012  . Atypical chest pain Fall 2013   Stress echo NORMAL 08/30/12  . Barrett esophagus 2012  . GERD (gastroesophageal reflux disease)   . Hyperlipidemia    HDL high, LDL high: simvastatin resulted in prolonged/severe muscle pain, led to long w/u and pt wants to avoid further statin unless lipids severely worsen  . Nephrolithiasis 10/2016   Bilat, nonobstruct, noted on CT chest done for f/u of pulm nodule seen on chest radiograph  . Palpitations Fall/winter 2017   Improved with lopressor  . Subclinical hypothyroidism    Patient give hx of hypfunctioning goiter in the distant past, says he was given pills to "kill" the goiter.  Says bx of goiter was benign.    Past Surgical History:  Procedure Laterality Date  . 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 Doreatha Lew)  . CARDIOVASCULAR STRESS TEST  08/2012   Stress echo normal.  Pt also reports normal ETT 09/2016.  Marland Kitchen ESOPHAGOGASTRODUODENOSCOPY  09/2011   Dr. Olevia Perches  . HOLTER MONITOR  10/2016   No pathologic arrhythmias.  Occ PVCs that occ corresponded to his palpitations.      Social History   Social History  . Marital status: Married    Spouse name: N/A  . Number of children: 2  .  Years of education: N/A   Occupational History  .  [employer]    Pilot   Social History Main Topics  . Smoking status: Former Smoker    Types: Cigarettes    Quit date: 08/18/1979  . Smokeless tobacco: Former Systems developer    Types: Chew     Comment: has not used since age 82  . Alcohol use No  . Drug use: No  . Sexual activity: Yes   Other Topics Concern  . Not on file   Social History Narrative   Married, boy/girl twins age 29yrs.   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   Originally from [place].   No T/A/Ds.   Cardiovasc exercise regularly.  Prudent diet.           Family History  Problem Relation Age of Onset  . Heart disease Father     Died of MI age 39, possible PE  . Heart disease Paternal Uncle     MI's in his 76s  . Heart disease Paternal Uncle   . Colon cancer Neg Hx     ROS: Cough but no fevers or chills, hemoptysis, dysphasia, odynophagia, melena, hematochezia, dysuria, hematuria, rash, seizure activity, orthopnea, PND, pedal edema, claudication. Remaining systems are negative.  Physical Exam:   Blood pressure 108/60, pulse 62, height 5\' 11"  (1.803 m), weight 166 lb (75.3 kg).  General:  Well developed/well nourished in NAD. Mildly anxious Skin warm/dry No peripheral clubbing Back-normal HEENT-normal/normal eyelids Neck supple/normal carotid upstroke bilaterally; no bruits; no JVD; no thyromegaly chest - CTA/ normal expansion CV - RRR/normal S1 and S2; no murmurs, rubs or gallops;  PMI nondisplaced Abdomen -NT/ND, no HSM, no mass, + bowel sounds, no bruit 2+ femoral pulses, no bruits Ext-no edema, chords, 2+ DP Neuro-grossly nonfocal  ECG - 12/17/2016-sinus rhythm with no ST changes; personally reviewed  A/P  1 palpitations-symptoms are consistent with PVCs. They have improved with metoprolol. I have asked him to avoid caffeine and alcohol. He feels stress may be contributing as well. He will contact as with worsening symptoms and we could increase metoprolol.  2 coronary calcification-recent stress echocardiogram normal and no exertional chest pain. Continue asa and statin.  3 hyperlipidemia-continue statin.  Kirk Ruths, MD

## 2017-01-06 DIAGNOSIS — L570 Actinic keratosis: Secondary | ICD-10-CM | POA: Diagnosis not present

## 2017-01-06 DIAGNOSIS — L821 Other seborrheic keratosis: Secondary | ICD-10-CM | POA: Diagnosis not present

## 2017-01-06 DIAGNOSIS — D1801 Hemangioma of skin and subcutaneous tissue: Secondary | ICD-10-CM | POA: Diagnosis not present

## 2017-01-06 DIAGNOSIS — D225 Melanocytic nevi of trunk: Secondary | ICD-10-CM | POA: Diagnosis not present

## 2017-01-08 ENCOUNTER — Ambulatory Visit (INDEPENDENT_AMBULATORY_CARE_PROVIDER_SITE_OTHER): Payer: BLUE CROSS/BLUE SHIELD | Admitting: Cardiology

## 2017-01-08 ENCOUNTER — Encounter: Payer: Self-pay | Admitting: Cardiology

## 2017-01-08 VITALS — BP 108/60 | HR 62 | Ht 71.0 in | Wt 166.0 lb

## 2017-01-08 DIAGNOSIS — E7800 Pure hypercholesterolemia, unspecified: Secondary | ICD-10-CM

## 2017-01-08 DIAGNOSIS — R002 Palpitations: Secondary | ICD-10-CM | POA: Diagnosis not present

## 2017-01-08 NOTE — Patient Instructions (Signed)
Your physician wants you to follow-up in: 6 MONTHS WITH DR CRENSHAW You will receive a reminder letter in the mail two months in advance. If you don't receive a letter, please call our office to schedule the follow-up appointment.   If you need a refill on your cardiac medications before your next appointment, please call your pharmacy.  

## 2017-01-17 DIAGNOSIS — R1032 Left lower quadrant pain: Secondary | ICD-10-CM | POA: Diagnosis not present

## 2017-01-17 DIAGNOSIS — N201 Calculus of ureter: Secondary | ICD-10-CM | POA: Diagnosis not present

## 2017-01-17 DIAGNOSIS — I10 Essential (primary) hypertension: Secondary | ICD-10-CM | POA: Diagnosis not present

## 2017-01-17 DIAGNOSIS — N202 Calculus of kidney with calculus of ureter: Secondary | ICD-10-CM | POA: Diagnosis not present

## 2017-01-17 DIAGNOSIS — N23 Unspecified renal colic: Secondary | ICD-10-CM | POA: Diagnosis not present

## 2017-01-17 DIAGNOSIS — Z79899 Other long term (current) drug therapy: Secondary | ICD-10-CM | POA: Diagnosis not present

## 2017-01-25 ENCOUNTER — Telehealth: Payer: Self-pay | Admitting: Family Medicine

## 2017-01-25 MED ORDER — ROSUVASTATIN CALCIUM 10 MG PO TABS: 10.0000 mg | ORAL_TABLET | Freq: Every day | ORAL | 3 refills | Status: DC

## 2017-01-25 NOTE — Telephone Encounter (Signed)
Sent in crestor Called the patient left message to call back

## 2017-01-25 NOTE — Telephone Encounter (Signed)
Updated allergy list with joint pain and low back pain. Patient informed of PCP instructions.

## 2017-01-25 NOTE — Telephone Encounter (Signed)
QOD is fine. I think he will tolerate it well. Disp 30 day supply with 5 rf or 90 day with 1 rf at patient discretion.

## 2017-01-25 NOTE — Telephone Encounter (Signed)
Caller name: Relationship to patient:Self Can be reached: [phone]  Pharmacy:  [id number] - [place], [place] [phone] (Phone) [phone] (Fax)     Reason for call: Patient called stating he needs to speak with provider about changing Atorvastatin to a generic of Crestor.

## 2017-01-25 NOTE — Telephone Encounter (Signed)
I am happy to change him to Rosuvastatin 10 mg po qhs, disp #30 with 3 rf and come in in 3 months with labs and an appt. Have him tell you if he has had any concerning side effects with the med so we can document in allergies. The rosuvastatin is water soluble so it has less side effects

## 2017-01-25 NOTE — Telephone Encounter (Signed)
Patient states he is going to take the crestor every other day as they was how he took atorvastatin.  He stated it certainly should not hurt any thing--advise

## 2017-01-26 NOTE — Telephone Encounter (Signed)
Patient aware ok to do.

## 2017-03-30 ENCOUNTER — Other Ambulatory Visit: Payer: Self-pay | Admitting: Family Medicine

## 2017-05-21 ENCOUNTER — Other Ambulatory Visit: Payer: Self-pay | Admitting: Family Medicine

## 2017-05-26 ENCOUNTER — Other Ambulatory Visit: Payer: Self-pay | Admitting: Family Medicine

## 2017-05-26 ENCOUNTER — Other Ambulatory Visit: Payer: Self-pay

## 2017-05-26 MED ORDER — OMEPRAZOLE 20 MG PO CPDR: DELAYED_RELEASE_CAPSULE | ORAL | 1 refills | Status: DC

## 2017-06-18 ENCOUNTER — Ambulatory Visit: Payer: BLUE CROSS/BLUE SHIELD | Admitting: Family Medicine

## 2017-07-01 ENCOUNTER — Encounter: Payer: Self-pay | Admitting: Family Medicine

## 2017-07-01 ENCOUNTER — Ambulatory Visit (INDEPENDENT_AMBULATORY_CARE_PROVIDER_SITE_OTHER): Payer: BLUE CROSS/BLUE SHIELD | Admitting: Family Medicine

## 2017-07-01 ENCOUNTER — Ambulatory Visit (HOSPITAL_BASED_OUTPATIENT_CLINIC_OR_DEPARTMENT_OTHER)
Admission: RE | Admit: 2017-07-01 | Discharge: 2017-07-01 | Disposition: A | Discharge status: Home / Self Care | Payer: BLUE CROSS/BLUE SHIELD | Source: Ambulatory Visit | Attending: Family Medicine | Admitting: Family Medicine

## 2017-07-01 VITALS — BP 112/72 | HR 70 | Temp 98.1°F | Wt 164.2 lb

## 2017-07-01 DIAGNOSIS — R0789 Other chest pain: Secondary | ICD-10-CM | POA: Diagnosis not present

## 2017-07-01 DIAGNOSIS — M5030 Other cervical disc degeneration, unspecified cervical region: Secondary | ICD-10-CM | POA: Insufficient documentation

## 2017-07-01 DIAGNOSIS — M542 Cervicalgia: Secondary | ICD-10-CM | POA: Diagnosis not present

## 2017-07-01 DIAGNOSIS — E785 Hyperlipidemia, unspecified: Secondary | ICD-10-CM

## 2017-07-01 NOTE — Patient Instructions (Addendum)
2 Ibuprofen 200 mg tabs twice daily with 1 Tylenol ES 500 mg twice daily   Add Lidocaine gel or patches, [employer], [place], Aspercreme all make a version Neck pain Back pain is very common in adults.The cause of back pain is rarely dangerous and the pain often gets better over time.The cause of your back pain may not be known. Some common causes of back pain include:  Strain of the muscles or ligaments supporting the spine.  Wear and tear (degeneration) of the spinal disks.  Arthritis.  Direct injury to the back.  For many people, back pain may return. Since back pain is rarely dangerous, most people can learn to manage this condition on their own. Follow these instructions at home: Watch your back pain for any changes. The following actions may help to lessen any discomfort you are feeling:  Remain active. It is stressful on your back to sit or stand in one place for long periods of time. Do not sit, drive, or stand in one place for more than 30 minutes at a time. Take short walks on even surfaces as soon as you are able.Try to increase the length of time you walk each day.  Exercise regularly as directed by your health care provider. Exercise helps your back heal faster. It also helps avoid future injury by keeping your muscles strong and flexible.  Do not stay in bed.Resting more than 1-2 days can delay your recovery.  Pay attention to your body when you bend and lift. The most comfortable positions are those that put less stress on your recovering back. Always use proper lifting techniques, including: ? Bending your knees. ? Keeping the load close to your body. ? Avoiding twisting.  Find a comfortable position to sleep. Use a firm mattress and lie on your side with your knees slightly bent. If you lie on your back, put a pillow under your knees.  Avoid feeling anxious or stressed.Stress increases muscle tension and can worsen back pain.It is important to recognize when you  are anxious or stressed and learn ways to manage it, such as with exercise.  Take medicines only as directed by your health care provider. Over-the-counter medicines to reduce pain and inflammation are often the most helpful.Your health care provider may prescribe muscle relaxant drugs.These medicines help dull your pain so you can more quickly return to your normal activities and healthy exercise.  Apply ice to the injured area: ? Put ice in a plastic bag. ? Place a towel between your skin and the bag. ? Leave the ice on for 20 minutes, 2-3 times a day for the first 2-3 days. After that, ice and heat may be alternated to reduce pain and spasms.  Maintain a healthy weight. Excess weight puts extra stress on your back and makes it difficult to maintain good posture.  Contact a health care provider if:  You have pain that is not relieved with rest or medicine.  You have increasing pain going down into the legs or buttocks.  You have pain that does not improve in one week.  You have night pain.  You lose weight.  You have a fever or chills. Get help right away if:  You develop new bowel or bladder control problems.  You have unusual weakness or numbness in your arms or legs.  You develop nausea or vomiting.  You develop abdominal pain.  You feel faint. This information is not intended to replace advice given to you by your  health care provider. Make sure you discuss any questions you have with your health care provider. Document Released: 10/19/2005 Document Revised: 02/27/2016 Document Reviewed: 02/20/2014 Elsevier Interactive Patient Education  2017 [employer].

## 2017-07-05 ENCOUNTER — Encounter: Payer: Self-pay | Admitting: Family Medicine

## 2017-07-05 NOTE — Assessment & Plan Note (Signed)
Tolerating statin, encouraged heart healthy diet, avoid trans fats, minimize simple carbs and saturated fats. Increase exercise as tolerated 

## 2017-07-05 NOTE — Assessment & Plan Note (Signed)
Symptoms improved with Metoprolol and no concerns will maintain medication for now

## 2017-07-05 NOTE — Progress Notes (Signed)
Subjective:    Patient ID: Tyler Schmidt, male    DOB: [birth year], 51 y.o.   MRN: [id number]  Chief Complaint  Patient presents with  . Follow-up    chest pain   . Neck Pain    X33months     HPI Patient is in today for follow up. He is noting several months of pain on the left side of his neck. He denies any trauma or falls. No radicular symptoms down his arm. It varies in intensity but is present most of the time. Worse after prolonged immobility ie sleep. No OTC meds are used regularly and help marginally. He is tolerating his crestor and metoprolol. Denies CP/palp/SOB/HA/congestion/fevers/GI or GU c/o. Taking meds as prescribed  Past Medical History:  Diagnosis Date  . Adrenal adenoma, left    Incidentaloma noted on noncontrast chest CT done to f/u lung nodule seen on chest radiograph (10/2016).  . Allergy   . Anxiety 08/12/2012  . Atypical chest pain Fall 2013   Stress echo NORMAL 08/30/12  . Barrett esophagus 2012  . GERD (gastroesophageal reflux disease)   . Hyperlipidemia    HDL high, LDL high: simvastatin resulted in prolonged/severe muscle pain, led to long w/u and pt wants to avoid further statin unless lipids severely worsen  . Neck pain 08/12/2012  . Nephrolithiasis 10/2016   Bilat, nonobstruct, noted on CT chest done for f/u of pulm nodule seen on chest radiograph  . Palpitations Fall/winter 2017   Improved with lopressor  . Subclinical hypothyroidism    Patient give hx of hypfunctioning goiter in the distant past, says he was given pills to "kill" the goiter.  Says bx of goiter was benign.    Past Surgical History:  Procedure Laterality Date  . 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 CARDIOVASCULAR STRESS TEST  08/2012   Stress echo normal.  Pt also reports normal ETT 09/2016.  Marland Kitchen ESOPHAGOGASTRODUODENOSCOPY  09/2011   Dr. Olevia Perches  . HOLTER MONITOR  10/2016   No pathologic arrhythmias.  Occ PVCs  that occ corresponded to his palpitations.      Family History  Problem Relation Age of Onset  . Heart disease Father        Died of MI age 39, possible PE  . Heart disease Paternal Uncle        MI's in his 14s  . Heart disease Paternal Uncle   . Colon cancer Neg Hx     Social History   Social History  . Marital status: Married    Spouse name: N/A  . Number of children: 2  . Years of education: N/A   Occupational History  .  [employer]    Pilot   Social History Main Topics  . Smoking status: Former Smoker    Types: Cigarettes    Quit date: 08/18/1979  . Smokeless tobacco: Former Systems developer    Types: Chew     Comment: has not used since age 51  . Alcohol use No  . Drug use: No  . Sexual activity: Yes   Other Topics Concern  . Not on file   Social History Narrative   Married, boy/girl twins age 61yrs.   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   Originally from [place].   No T/A/Ds.   Cardiovasc exercise regularly.  Prudent diet.          Outpatient Medications Prior to Visit  Medication Sig Dispense  Refill  . fluticasone (FLONASE) 50 MCG/ACT nasal spray Place 2 sprays into both nostrils daily. 16 g 6  . Krill Oil CAPS Take 1 capsule by mouth.    . metoprolol tartrate (LOPRESSOR) 25 MG tablet TAKE 1 TABLET (25 MG TOTAL) BY MOUTH 2 (TWO) TIMES DAILY. 90 tablet 1  . Multiple Vitamin (ONE-A-DAY MENS PO) Take by mouth daily.      Marland Kitchen omeprazole (PRILOSEC) 20 MG capsule TAKE ONE CAPSULE BY MOUTH TWICE A DAY 180 capsule 1  . rosuvastatin (CRESTOR) 10 MG tablet Take 1 tablet (10 mg total) by mouth daily. 30 tablet 3  . Ubiquinol (QUNOL COQ10/UBIQUINOL/MEGA) 100 MG CAPS Take 1 capsule by mouth daily.    . fexofenadine (ALLEGRA) 180 MG tablet Take 1 tablet (180 mg total) by mouth daily. 30 tablet 11  . niacin 500 MG tablet Take 500 mg by mouth daily with breakfast.      . omeprazole (PRILOSEC) 20 MG capsule TAKE 1 CAPSULE (20 MG TOTAL) BY MOUTH 2 (TWO) TIMES DAILY. 180 capsule 1   No  facility-administered medications prior to visit.     Allergies  Allergen Reactions  . Simvastatin Other (See Comments)    myalgias  . Atorvastatin Other (See Comments)    Joint pain and low back pain    Review of Systems  Constitutional: Negative for fever and malaise/fatigue.  HENT: Negative for congestion.   Eyes: Negative for blurred vision.  Respiratory: Negative for shortness of breath.   Cardiovascular: Negative for chest pain, palpitations and leg swelling.  Gastrointestinal: Negative for abdominal pain, blood in stool and nausea.  Genitourinary: Negative for dysuria and frequency.  Musculoskeletal: Positive for neck pain. Negative for falls.  Skin: Negative for rash.  Neurological: Negative for dizziness, loss of consciousness and headaches.  Endo/Heme/Allergies: Negative for environmental allergies.  Psychiatric/Behavioral: Negative for depression. The patient is not nervous/anxious.        Objective:    Physical Exam  Constitutional: He is oriented to person, place, and time. He appears well-developed and well-nourished. No distress.  HENT:  Head: Normocephalic and atraumatic.  Nose: Nose normal.  Eyes: Right eye exhibits no discharge. Left eye exhibits no discharge.  Neck: Normal range of motion. Neck supple.  Cardiovascular: Normal rate and regular rhythm.   No murmur heard. Pulmonary/Chest: Effort normal and breath sounds normal.  Abdominal: Soft. Bowel sounds are normal. There is no tenderness.  Musculoskeletal: He exhibits tenderness. He exhibits no edema.  Left sternocleidomastoid muscle spasm noted.   Neurological: He is alert and oriented to person, place, and time.  Skin: Skin is warm and dry.  Psychiatric: He has a normal mood and affect.  Nursing note and vitals reviewed.   BP 112/72 (BP Location: Left Arm, Patient Position: Sitting, Cuff Size: Normal)   Pulse 70   Temp 98.1 F (36.7 C) (Oral)   Wt 164 lb 3.2 oz (74.5 kg)   SpO2 96%   BMI  22.90 kg/m  Wt Readings from Last 3 Encounters:  07/01/17 164 lb 3.2 oz (74.5 kg)  01/08/17 166 lb (75.3 kg)  12/17/16 166 lb (75.3 kg)     Lab Results  Component Value Date   WBC 9.8 09/03/2016   HGB 15.5 09/03/2016   HCT 45.2 09/03/2016   PLT 245.0 09/03/2016   GLUCOSE 83 12/17/2016   CHOL 243 (H) 12/17/2016   TRIG 280.0 (H) 12/17/2016   HDL 39.50 12/17/2016   LDLDIRECT 172.0 12/17/2016   ALT 31 12/17/2016  AST 21 12/17/2016   NA 137 12/17/2016   K 3.7 12/17/2016   CL 100 12/17/2016   CREATININE 0.99 12/17/2016   BUN 13 12/17/2016   CO2 29 12/17/2016   TSH 6.23 (H) 12/17/2016    Lab Results  Component Value Date   TSH 6.23 (H) 12/17/2016   Lab Results  Component Value Date   WBC 9.8 09/03/2016   HGB 15.5 09/03/2016   HCT 45.2 09/03/2016   MCV 86.8 09/03/2016   PLT 245.0 09/03/2016   Lab Results  Component Value Date   NA 137 12/17/2016   K 3.7 12/17/2016   CO2 29 12/17/2016   GLUCOSE 83 12/17/2016   BUN 13 12/17/2016   CREATININE 0.99 12/17/2016   BILITOT 0.4 12/17/2016   ALKPHOS 63 12/17/2016   AST 21 12/17/2016   ALT 31 12/17/2016   PROT 7.4 12/17/2016   ALBUMIN 4.3 12/17/2016   CALCIUM 9.4 12/17/2016   GFR 85.43 12/17/2016   Lab Results  Component Value Date   CHOL 243 (H) 12/17/2016   Lab Results  Component Value Date   HDL 39.50 12/17/2016   No results found for: [facility] Lab Results  Component Value Date   TRIG 280.0 (H) 12/17/2016   Lab Results  Component Value Date   CHOLHDL 6 12/17/2016   No results found for: HGBA1C     Assessment & Plan:   Problem List Items Addressed This Visit    Atypical chest pain    Symptoms improved with Metoprolol and no concerns will maintain medication for now      Neck pain - Primary    Left sided stiffness and pain worse upon awakening. He has an appointment with a neurosurgeon soon and xrays are obtained which show no acute concerns. Mild degenerative changes noted. Encouraged moist heat  and gentle stretching as tolerated. May try NSAIDs and prescription meds as directed and report if symptoms worsen or seek immediate care. Try topical treatments prn.      Relevant Orders   DG Cervical Spine Complete (Completed)   Hyperlipidemia, mild    Tolerating statin, encouraged heart healthy diet, avoid trans fats, minimize simple carbs and saturated fats. Increase exercise as tolerated         I have discontinued Mr. Shipman's niacin and fexofenadine. I am also having him maintain his Multiple Vitamin (ONE-A-DAY MENS PO), Krill Oil, fluticasone, Ubiquinol, rosuvastatin, metoprolol tartrate, omeprazole, and tamsulosin.  Meds ordered this encounter  Medications  . tamsulosin (FLOMAX) 0.4 MG CAPS capsule    Sig: Take by mouth.     Penni Homans, MD

## 2017-07-05 NOTE — Assessment & Plan Note (Signed)
Left sided stiffness and pain worse upon awakening. He has an appointment with a neurosurgeon soon and xrays are obtained which show no acute concerns. Mild degenerative changes noted. Encouraged moist heat and gentle stretching as tolerated. May try NSAIDs and prescription meds as directed and report if symptoms worsen or seek immediate care. Try topical treatments prn.

## 2017-07-29 ENCOUNTER — Other Ambulatory Visit: Payer: Self-pay | Admitting: Family Medicine

## 2017-08-12 DIAGNOSIS — M542 Cervicalgia: Secondary | ICD-10-CM | POA: Diagnosis not present

## 2017-08-13 ENCOUNTER — Other Ambulatory Visit: Payer: Self-pay | Admitting: Family Medicine

## 2017-12-15 ENCOUNTER — Encounter: Payer: Self-pay | Admitting: Family Medicine

## 2017-12-15 ENCOUNTER — Ambulatory Visit: Payer: BLUE CROSS/BLUE SHIELD | Admitting: Family Medicine

## 2017-12-15 VITALS — BP 112/70 | HR 75 | Temp 97.7°F | Resp 20 | Wt 168.8 lb

## 2017-12-15 DIAGNOSIS — R591 Generalized enlarged lymph nodes: Secondary | ICD-10-CM

## 2017-12-15 DIAGNOSIS — J0100 Acute maxillary sinusitis, unspecified: Secondary | ICD-10-CM | POA: Diagnosis not present

## 2017-12-15 MED ORDER — PREDNISONE 20 MG PO TABS: ORAL_TABLET | ORAL | 0 refills | Status: DC

## 2017-12-15 MED ORDER — AMOXICILLIN-POT CLAVULANATE 875-125 MG PO TABS: 1.0000 | ORAL_TABLET | Freq: Two times a day (BID) | ORAL | 0 refills | Status: DC

## 2017-12-15 NOTE — Progress Notes (Signed)
Tyler Schmidt , November 04, 1965, 52 y.o., male MRN: [id number] Patient Care Team    Relationship Specialty Notifications Start End  Mosie Lukes, MD PCP - General Family Medicine  12/17/16   Romeo Apple, MD (Inactive) Consulting Physician Cardiology  10/03/11   Lafayette Dragon, MD (Inactive) Consulting Physician Gastroenterology  10/03/11   Lelon Perla, MD Consulting Physician Cardiology  01/11/17     Chief Complaint  Patient presents with  . URI    right side if face painful, lymph glands sore     Subjective: Pt presents for an OV with complaints of right sided facial pain  of 1 week duration. The pain is in his right ear, eye and teeth.  Associated symptoms include swollen glands. He had one of the worst colds in his life. He started to feel a little better, until a few days ago it has become worse.  He endorses cough, fatigue, hoarseness, dizzinesss. He denies current fever, chills, nausea or vomit. Pt has tried decongestant and ibuprophen to ease their symptoms. 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   No flowsheet data found.  Allergies  Allergen Reactions  . Simvastatin Other (See Comments)    myalgias  . Atorvastatin Other (See Comments)    Joint pain and low back pain   Social History   Tobacco Use  . Smoking status: Former Smoker    Types: Cigarettes    Last attempt to quit: 08/18/1979    Years since quitting: 38.3  . Smokeless tobacco: Former Systems developer    Types: Chew  . Tobacco comment: has not used since age 60  Substance Use Topics  . Alcohol use: No    Alcohol/week: 0.0 oz   Past Medical History:  Diagnosis Date  . Adrenal adenoma, left    Incidentaloma noted on noncontrast chest CT done to f/u lung nodule seen on chest radiograph (10/2016).  . Allergy   . Anxiety 08/12/2012  . Atypical chest pain Fall 2013   Stress echo NORMAL 08/30/12  . Barrett esophagus 2012  . GERD (gastroesophageal reflux disease)   . Hyperlipidemia    HDL high, LDL  high: simvastatin resulted in prolonged/severe muscle pain, led to long w/u and pt wants to avoid further statin unless lipids severely worsen  . Neck pain 08/12/2012  . Nephrolithiasis 10/2016   Bilat, nonobstruct, noted on CT chest done for f/u of pulm nodule seen on chest radiograph  . Palpitations Fall/winter 2017   Improved with lopressor  . Subclinical hypothyroidism    Patient give hx of hypfunctioning goiter in the distant past, says he was given pills to "kill" the goiter.  Says bx of goiter was benign.   Past Surgical History:  Procedure Laterality Date  . 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 CARDIOVASCULAR STRESS TEST  08/2012   Stress echo normal.  Pt also reports normal ETT 09/2016.  Marland Kitchen ESOPHAGOGASTRODUODENOSCOPY  09/2011   Dr. Olevia Perches  . HOLTER MONITOR  10/2016   No pathologic arrhythmias.  Occ PVCs that occ corresponded to his palpitations.     Family History  Problem Relation Age of Onset  . Heart disease Father        Died of MI age 53, possible PE  . Heart disease Paternal Uncle        MI's in his 30s  . Heart disease Paternal Uncle   . Colon cancer Neg  Hx    Allergies as of 12/15/2017      Reactions   Simvastatin Other (See Comments)   myalgias   Atorvastatin Other (See Comments)   Joint pain and low back pain      Medication List        Accurate as of 12/15/17  2:01 PM. Always use your most recent med list.          amoxicillin-clavulanate 875-125 MG tablet Commonly known as:  AUGMENTIN Take 1 tablet by mouth 2 (two) times daily.   fluticasone 50 MCG/ACT nasal spray Commonly known as:  FLONASE Place 2 sprays into both nostrils daily.   Krill Oil Caps Take 1 capsule by mouth.   metoprolol tartrate 25 MG tablet Commonly known as:  LOPRESSOR TAKE 1 TABLET (25 MG TOTAL) BY MOUTH 2 (TWO) TIMES DAILY.   omeprazole 20 MG capsule Commonly known as:  PRILOSEC TAKE ONE CAPSULE BY MOUTH  TWICE A DAY   ONE-A-DAY MENS PO Take by mouth daily.   predniSONE 20 MG tablet Commonly known as:  DELTASONE 60 mg x3d, 40 mg x3d, 20 mg x2d, 10 mg x2d   QUNOL COQ10/UBIQUINOL/MEGA 100 MG Caps Generic drug:  Ubiquinol Take 1 capsule by mouth daily.   rosuvastatin 10 MG tablet Commonly known as:  CRESTOR TAKE 1 TABLET (10 MG TOTAL) BY MOUTH DAILY.   tamsulosin 0.4 MG Caps capsule Commonly known as:  FLOMAX Take by mouth.       All past medical history, surgical history, allergies, family history, immunizations andmedications were updated in the EMR today and reviewed under the history and medication portions of their EMR.     ROS: Negative, with the exception of above mentioned in HPI   Objective:  BP 112/70 (BP Location: Left Arm, Patient Position: Sitting, Cuff Size: Large)   Pulse 75   Temp 97.7 F (36.5 C)   Resp 20   Wt 168 lb 12 oz (76.5 kg)   SpO2 97%   BMI 23.54 kg/m  Body mass index is 23.54 kg/m. Gen: Afebrile. No acute distress. Nontoxic in appearance, well developed, well nourished. Pleasant caucasian male.  HENT: AT. Mild swelling right face inferior to zygomatic arch to maxillary . No teeth or gum pain on exam.  Bilateral TM visualized without erythema or fullness. MMM, no oral lesions. Bilateral nares with erythema, drainage and dried blood left. Throat without erythema or exudates. Mild cough, hoarseness and TTP right max sinus.  Neck/lymp/endocrine: Supple, right ant cervical  Lymphadenopathy, + ~2cm palpable tender mobile lymph node right supraclavicular. CV: RRR  Chest: CTAB. No wheezing or rhonchi.    No exam data present No results found. No results found for this or any previous visit (from the past 24 hour(s)).  Assessment/Plan: [name] is a 52 y.o. male present for OV for  Acute non-recurrent maxillary sinusitis Lymphadenopathy Rest, hydrate. Monitor supraclavicular lymph node, if does not completely resolve after tx he is  encouraged to follow with PCP. Possibly could be secondary to infection/exposure to drywall dust.  + flonase, mucinex (DM if cough), nettie pot or nasal saline.  Prednisone, Augmentin, tessalon Perles. prescribed, take until completed.  If cough present it can last up to 6-8 weeks.  F/U 2 weeks of not improved.     Reviewed expectations re: course of current medical issues.  Discussed self-management of symptoms.  Outlined signs and symptoms indicating need for more acute intervention.  Patient verbalized understanding and all questions were answered.  Patient  received an Scientific laboratory technician.    No orders of the defined types were placed in this encounter.    Note is dictated utilizing voice recognition software. Although note has been proof read prior to signing, occasional typographical errors still can be missed. If any questions arise, please do not hesitate to call for verification.   electronically signed by:  Howard Pouch, DO  [place]

## 2017-12-15 NOTE — Patient Instructions (Signed)
Rest, hydrate.  + flonase, mucinex -DM , nettie pot or nasal saline.  Tessalon perles Augmentin and prednisone  prescribed, take until completed.  If cough present it can last up to 6-8 weeks.  F/U 2 weeks of not improved.   Monitor the lymph node and if it does not resolve after meds then please see you PCP for further evaluation.    Sinusitis, Adult Sinusitis is soreness and inflammation of your sinuses. Sinuses are hollow spaces in the bones around your face. They are located:  Around your eyes.  In the middle of your forehead.  Behind your nose.  In your cheekbones.  Your sinuses and nasal passages are lined with a stringy fluid (mucus). Mucus normally drains out of your sinuses. When your nasal tissues get inflamed or swollen, the mucus can get trapped or blocked so air cannot flow through your sinuses. This lets bacteria, viruses, and funguses grow, and that leads to infection. Follow these instructions at home: Medicines  Take, use, or apply over-the-counter and prescription medicines only as told by your doctor. These may include nasal sprays.  If you were prescribed an antibiotic medicine, take it as told by your doctor. Do not stop taking the antibiotic even if you start to feel better. Hydrate and Humidify  Drink enough water to keep your pee (urine) clear or pale yellow.  Use a cool mist humidifier to keep the humidity level in your home above 50%.  Breathe in steam for 10-15 minutes, 3-4 times a day or as told by your doctor. You can do this in the bathroom while a hot shower is running.  Try not to spend time in cool or dry air. Rest  Rest as much as possible.  Sleep with your head raised (elevated).  Make sure to get enough sleep each night. General instructions  Put a warm, moist washcloth on your face 3-4 times a day or as told by your doctor. This will help with discomfort.  Wash your hands often with soap and water. If there is no soap and water, use  hand sanitizer.  Do not smoke. Avoid being around people who are smoking (secondhand smoke).  Keep all follow-up visits as told by your doctor. This is important. Contact a doctor if:  You have a fever.  Your symptoms get worse.  Your symptoms do not get better within 10 days. Get help right away if:  You have a very bad headache.  You cannot stop throwing up (vomiting).  You have pain or swelling around your face or eyes.  You have trouble seeing.  You feel confused.  Your neck is stiff.  You have trouble breathing. This information is not intended to replace advice given to you by your health care provider. Make sure you discuss any questions you have with your health care provider. Document Released: 04/06/2008 Document Revised: 06/14/2016 Document Reviewed: 08/14/2015 Elsevier Interactive Patient Education  [employer].

## 2017-12-17 ENCOUNTER — Other Ambulatory Visit: Payer: Self-pay | Admitting: Family Medicine

## 2017-12-30 ENCOUNTER — Ambulatory Visit: Payer: BLUE CROSS/BLUE SHIELD | Admitting: Family Medicine

## 2018-02-15 ENCOUNTER — Other Ambulatory Visit: Payer: Self-pay | Admitting: Family Medicine

## 2018-04-13 ENCOUNTER — Ambulatory Visit: Payer: Self-pay

## 2018-04-13 ENCOUNTER — Ambulatory Visit: Payer: BLUE CROSS/BLUE SHIELD | Admitting: Family Medicine

## 2018-04-13 VITALS — BP 112/78 | HR 59 | Ht 71.0 in | Wt 165.0 lb

## 2018-04-13 DIAGNOSIS — G8929 Other chronic pain: Secondary | ICD-10-CM | POA: Diagnosis not present

## 2018-04-13 DIAGNOSIS — M25511 Pain in right shoulder: Secondary | ICD-10-CM

## 2018-04-13 DIAGNOSIS — M25512 Pain in left shoulder: Secondary | ICD-10-CM

## 2018-04-13 DIAGNOSIS — M2550 Pain in unspecified joint: Secondary | ICD-10-CM | POA: Insufficient documentation

## 2018-04-13 MED ORDER — VITAMIN D (ERGOCALCIFEROL) 1.25 MG (50000 UNIT) PO CAPS: 50000.0000 [IU] | ORAL_CAPSULE | ORAL | 0 refills | Status: DC

## 2018-04-13 MED ORDER — PREDNISONE 50 MG PO TABS: 50.0000 mg | ORAL_TABLET | Freq: Every day | ORAL | 0 refills | Status: DC

## 2018-04-13 NOTE — Assessment & Plan Note (Signed)
Acromioclavicular pain bilaterally.  We discussed topical anti-inflammatories, icing regimen, patient given home exercises.  Discussed which activities of doing which wants to avoid.  Patient will do vitamin D supplementation.  Patient will follow-up with me again in 4 weeks.  Worsening symptoms consider injections.

## 2018-04-13 NOTE — Progress Notes (Signed)
[facility] [place], [place] [address] Phone: [phone] Subjective:       CC: Left and right shoulder pain  [id number]  Tyler Schmidt is a 52 y.o. male coming in with complaint of bilateral shoulder pain. Had pain for 1.4 months. Pain in the middle deltoid. Intermittent pain. Has to sleep with arm overhead and when he moves his arm down he feels the pain.   Right shoulder pain began 2 weeks ago when he was playing handball. Describes pain in the [facility] joint. Has pain with horizontal adduction. Is an active person and is heading on vacation and would like to be able to join his son in sporting activities.   Patient mentions being on a statin drug and this has previously caused him muscle aches. He is on a new statin and is wondering if some of his pain is coming from that drug.      Past Medical History:  Diagnosis Date  . Adrenal adenoma, left    Incidentaloma noted on noncontrast chest CT done to f/u lung nodule seen on chest radiograph (10/2016).  . Allergy   . Anxiety 08/12/2012  . Atypical chest pain Fall 2013   Stress echo NORMAL 08/30/12  . Barrett esophagus 2012  . GERD (gastroesophageal reflux disease)   . Hyperlipidemia    HDL high, LDL high: simvastatin resulted in prolonged/severe muscle pain, led to long w/u and pt wants to avoid further statin unless lipids severely worsen  . Neck pain 08/12/2012  . Nephrolithiasis 10/2016   Bilat, nonobstruct, noted on CT chest done for f/u of pulm nodule seen on chest radiograph  . Palpitations Fall/winter 2017   Improved with lopressor  . Subclinical hypothyroidism    Patient give hx of hypfunctioning goiter in the distant past, says he was given pills to "kill" the goiter.  Says bx of goiter was benign.   Past Surgical History:  Procedure Laterality Date  . 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 CARDIOVASCULAR STRESS  TEST  08/2012   Stress echo normal.  Pt also reports normal ETT 09/2016.  Marland Kitchen ESOPHAGOGASTRODUODENOSCOPY  09/2011   Dr. Olevia Perches  . HOLTER MONITOR  10/2016   No pathologic arrhythmias.  Occ PVCs that occ corresponded to his palpitations.     Social History   Socioeconomic History  . Marital status: Married    Spouse name: Not on file  . Number of children: 2  . Years of education: Not on file  . Highest education level: Not on file  Occupational History    Employer: [employer]    Comment: Pilot  Social Needs  . Financial resource strain: Not on file  . Food insecurity:    Worry: Not on file    Inability: Not on file  . Transportation needs:    Medical: Not on file    Non-medical: Not on file  Tobacco Use  . Smoking status: Former Smoker    Types: Cigarettes    Last attempt to quit: 08/18/1979    Years since quitting: 38.6  . Smokeless tobacco: Former Systems developer    Types: Chew  . Tobacco comment: has not used since age 81  Substance and Sexual Activity  . Alcohol use: No    Alcohol/week: 0.0 oz  . Drug use: No  . Sexual activity: Yes  Lifestyle  . Physical activity:    Days per week: Not  on file    Minutes per session: Not on file  . Stress: Not on file  Relationships  . Social connections:    Talks on phone: Not on file    Gets together: Not on file    Attends religious service: Not on file    Active member of club or organization: Not on file    Attends meetings of clubs or organizations: Not on file    Relationship status: Not on file  Other Topics Concern  . Not on file  Social History Narrative   Married, boy/girl twins age 35yrs.   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   Originally from [place].   No T/A/Ds.   Cardiovasc exercise regularly.  Prudent diet.         Allergies  Allergen Reactions  . Simvastatin Other (See Comments)    myalgias  . Atorvastatin Other (See Comments)    Joint pain and low back pain   Family History  Problem Relation Age of Onset  . Heart  disease Father        Died of MI age 63, possible PE  . Heart disease Paternal Uncle        MI's in his 44s  . Heart disease Paternal Uncle   . Colon cancer Neg Hx      Past medical history, social, surgical and family history all reviewed in electronic medical record.  No pertanent information unless stated regarding to the chief complaint.   Review of Systems:Review of systems updated and as accurate as of 04/13/18  No headache, visual changes, nausea, vomiting, diarrhea, constipation, dizziness, abdominal pain, skin rash, fevers, chills, night sweats, weight loss, swollen lymph nodes, body aches, joint swelling, chest pain, shortness of breath, mood changes.  Positive muscle aches  Objective  Blood pressure 112/78, pulse (!) 59, height 5\' 11"  (1.803 m), weight 165 lb (74.8 kg), SpO2 98 %. Systems examined below as of 04/13/18   General: No apparent distress alert and oriented x3 mood and affect normal, dressed appropriately.  HEENT: Pupils equal, extraocular movements intact  Respiratory: Patient's speak in full sentences and does not appear short of breath  Cardiovascular: No lower extremity edema, non tender, no erythema  Skin: Warm dry intact with no signs of infection or rash on extremities or on axial skeleton.  Abdomen: Soft nontender  Neuro: Cranial nerves II through XII are intact, neurovascularly intact in all extremities with 2+ DTRs and 2+ pulses.  Lymph: No lymphadenopathy of posterior or anterior cervical chain or axillae bilaterally.  Gait normal with good balance and coordination.  MSK:  Non tender with full range of motion and good stability and symmetric strength and tone of  elbows, wrist, hip, knee and ankles bilaterally.   Shoulder: left Inspection reveals no abnormalities, atrophy or asymmetry. Palpation is normal with no tenderness over AC joint or bicipital groove. ROM is full in all planes passively. Rotator cuff strength normal throughout. signs of  impingement with positive Neer and Hawkin's tests, but negative empty can sign. Speeds and Yergason's tests normal. No labral pathology noted with negative Obrien's, negative clunk and good stability. Normal scapular function observed. No painful arc and no drop arm sign. No apprehension sign Contralateral shoulder does have a positive crossover   MSK US performed of: left This study was ordered, performed, and interpreted by Charlann Boxer D.O.  Shoulder:   Supraspinatus:  Appears normal on long and transverse views, Bursal bulge seen with shoulder abduction on impingement view. Infraspinatus:  Appears normal on long and transverse views. Significant increase in Doppler flow Subscapularis:  Appears normal on long and transverse views. Positive bursa Teres Minor:  Appears normal on long and transverse views. AC joint: Capsular distention noted mild arthritic changes Glenohumeral Joint:  Appears normal without effusion. Glenoid Labrum:  Intact without visualized tears. Biceps Tendon:  Appears normal on long and transverse views, no fraying of tendon, tendon located in intertubercular groove, no subluxation with shoulder internal or external rotation.  Impression: Subacromial bursitis with mild arthritis of the left acromioclavicular      Impression and Recommendations:     This case required medical decision making of moderate complexity.      Note: This dictation was prepared with Dragon dictation along with smaller phrase technology. Any transcriptional errors that result from this process are unintentional.

## 2018-04-13 NOTE — Patient Instructions (Addendum)
Good to see you  Ice 20 minutes 2 times daily. Usually after activity and before bed. Exercises 3 times a week.  Keep hands within peripheral vision  pennsaid pinkie amount topically 2 times daily as needed.  Once weekly vitamin D for 12 weeks If worsening pain then use the prednsione daily for 5 days See me again in 3-4 weeks if not perfect and we will discuss injections

## 2018-04-20 ENCOUNTER — Telehealth: Payer: Self-pay | Admitting: Family Medicine

## 2018-04-20 ENCOUNTER — Encounter: Payer: Self-pay | Admitting: Family Medicine

## 2018-04-20 NOTE — Telephone Encounter (Unsigned)
Copied from [place] [phone]. Topic: Quick Communication - See Telephone Encounter >> Apr 20, 2018  9:35 AM Hewitt Shorts wrote: Pt is out of town a [place] and  has left his pednizone at home and is still having his shoulder pain and increased joint pain  The pharmacy is [facility] (he is not sure of the address) in [place] number [phone]

## 2018-04-20 NOTE — Telephone Encounter (Signed)
Pt seen by Dr. Tamala Julian 04/13/18 for shoulder pain. RX prednisone 50mg  QD , 5 tabs. Pt states he is out of town and left medication at home . States he and is still having shoulder pain, increased joint pain. Requesting RX called in to local RX.  If appropriate: [facility] [address] , [place], [place] number [phone]

## 2018-04-21 DIAGNOSIS — Z6822 Body mass index (BMI) 22.0-22.9, adult: Secondary | ICD-10-CM | POA: Diagnosis not present

## 2018-04-21 DIAGNOSIS — M2550 Pain in unspecified joint: Secondary | ICD-10-CM | POA: Diagnosis not present

## 2018-04-21 DIAGNOSIS — R5383 Other fatigue: Secondary | ICD-10-CM | POA: Diagnosis not present

## 2018-04-21 NOTE — Telephone Encounter (Signed)
Spoke with patient. Patient left medication at home and is out on vacation. Has not begun taking medication and would like an rx as he is in a lot of pain. Per a verbal from Dr. Tamala Julian, he is ok with sending in an RX for another 5 tablets of prednisone. [facility]

## 2018-05-02 ENCOUNTER — Encounter: Payer: Self-pay | Admitting: Family Medicine

## 2018-05-02 ENCOUNTER — Ambulatory Visit: Payer: BLUE CROSS/BLUE SHIELD | Admitting: Family Medicine

## 2018-05-02 VITALS — BP 118/76 | HR 67 | Temp 98.7°F | Resp 18 | Wt 163.8 lb

## 2018-05-02 DIAGNOSIS — E785 Hyperlipidemia, unspecified: Secondary | ICD-10-CM

## 2018-05-02 DIAGNOSIS — M791 Myalgia, unspecified site: Secondary | ICD-10-CM

## 2018-05-02 DIAGNOSIS — R7989 Other specified abnormal findings of blood chemistry: Secondary | ICD-10-CM | POA: Diagnosis not present

## 2018-05-02 DIAGNOSIS — R768 Other specified abnormal immunological findings in serum: Secondary | ICD-10-CM | POA: Diagnosis not present

## 2018-05-02 DIAGNOSIS — K219 Gastro-esophageal reflux disease without esophagitis: Secondary | ICD-10-CM

## 2018-05-02 DIAGNOSIS — M2550 Pain in unspecified joint: Secondary | ICD-10-CM

## 2018-05-02 LAB — COMPREHENSIVE METABOLIC PANEL
ALK PHOS: 71 U/L (ref 39–117)
ALT: 25 U/L (ref 0–53)
AST: 15 U/L (ref 0–37)
Albumin: 4.3 g/dL (ref 3.5–5.2)
BUN: 17 mg/dL (ref 6–23)
CO2: 32 mEq/L (ref 19–32)
Calcium: 9.5 mg/dL (ref 8.4–10.5)
Chloride: 103 mEq/L (ref 96–112)
Creatinine, Ser: 0.94 mg/dL (ref 0.40–1.50)
GFR: 90.18 mL/min (ref >60.00)
GLUCOSE: 95 mg/dL (ref 70–99)
POTASSIUM: 5 meq/L (ref 3.5–5.1)
SODIUM: 140 meq/L (ref 135–145)
TOTAL PROTEIN: 7.1 g/dL (ref 6.0–8.3)
Total Bilirubin: 0.5 mg/dL (ref 0.2–1.2)

## 2018-05-02 LAB — SEDIMENTATION RATE: Sed Rate: 7 mm/hr (ref 0–20)

## 2018-05-02 LAB — URIC ACID: URIC ACID, SERUM: 5.4 mg/dL (ref 4.0–7.8)

## 2018-05-02 LAB — T4, FREE: FREE T4: 0.8 ng/dL (ref 0.60–1.60)

## 2018-05-02 LAB — TSH: TSH: 7.83 u[IU]/mL — AB (ref 0.35–4.50)

## 2018-05-02 MED ORDER — MELOXICAM 7.5 MG PO TABS: 7.5000 mg | ORAL_TABLET | Freq: Two times a day (BID) | ORAL | 3 refills | Status: DC

## 2018-05-02 MED ORDER — PREDNISONE 20 MG PO TABS: 40.0000 mg | ORAL_TABLET | Freq: Every day | ORAL | 0 refills | Status: DC

## 2018-05-02 NOTE — Progress Notes (Signed)
Subjective:  I acted as a Education administrator for Dr. Charlett Blake. [place], [place]  Patient ID: Tyler Schmidt, male    DOB: [birth year], 52 y.o.   MRN: [id number]  No chief complaint on file.   HPI  Patient is in today for a follow up and he is noting sudden onset of diffuse joint pain recently.  Began about 3 weeks ago and now includes his shoulders left worse than right his back low back in particular his ankles his knees his hips and his hands most notably the second finger on his right hand and thumb on his left hand more so than the right.  He also notes wrist pain most notably on the right.  He denies any falls or trauma.  He denies any redness or warmth.  He  denies any preceding febrile illness..  He has had other than fatigue he reports otherwise he had been feeling well flares that were not as severe in the past and has had a positive ANA and tested previously. Denies CP/palp/SOB/HA/congestion/fevers/GI or GU c/o. Taking meds as prescribed  Patient Care Team: Mosie Lukes, MD as PCP - General (Family Medicine) Romeo Apple, MD (Inactive) as Consulting Physician (Cardiology) Lafayette Dragon, MD (Inactive) as Consulting Physician (Gastroenterology) Lelon Perla, MD as Consulting Physician (Cardiology)   Past Medical History:  Diagnosis Date  . Adrenal adenoma, left    Incidentaloma noted on noncontrast chest CT done to f/u lung nodule seen on chest radiograph (10/2016).  . Allergy   . Anxiety 08/12/2012  . Atypical chest pain Fall 2013   Stress echo NORMAL 08/30/12  . Barrett esophagus 2012  . GERD (gastroesophageal reflux disease)   . Hyperlipidemia    HDL high, LDL high: simvastatin resulted in prolonged/severe muscle pain, led to long w/u and pt wants to avoid further statin unless lipids severely worsen  . Neck pain 08/12/2012  . Nephrolithiasis 10/2016   Bilat, nonobstruct, noted on CT chest done for f/u of pulm nodule seen on chest radiograph  . Palpitations Fall/winter 2017    Improved with lopressor  . Subclinical hypothyroidism    Patient give hx of hypfunctioning goiter in the distant past, says he was given pills to "kill" the goiter.  Says bx of goiter was benign.    Past Surgical History:  Procedure Laterality Date  . 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 Doreatha Lew)  . CARDIOVASCULAR STRESS TEST  08/2012   Stress echo normal.  Pt also reports normal ETT 09/2016.  Marland Kitchen ESOPHAGOGASTRODUODENOSCOPY  09/2011   Dr. Olevia Perches  . HOLTER MONITOR  10/2016   No pathologic arrhythmias.  Occ PVCs that occ corresponded to his palpitations.      Family History  Problem Relation Age of Onset  . Heart disease Father        Died of MI age 27, possible PE  . Heart disease Paternal Uncle        MI's in his 24s  . Heart disease Paternal Uncle   . Colon cancer Neg Hx     Social History   Socioeconomic History  . Marital status: Married    Spouse name: Not on file  . Number of children: 2  . Years of education: Not on file  . Highest education level: Not on file  Occupational History    Employer: [employer]    Comment: Pilot  Social Needs  . Financial resource strain: Not on file  .  Food insecurity:    Worry: Not on file    Inability: Not on file  . Transportation needs:    Medical: Not on file    Non-medical: Not on file  Tobacco Use  . Smoking status: Former Smoker    Types: Cigarettes    Last attempt to quit: 08/18/1979    Years since quitting: 38.7  . Smokeless tobacco: Former Systems developer    Types: Chew  . Tobacco comment: has not used since age 8  Substance and Sexual Activity  . Alcohol use: No    Alcohol/week: 0.0 oz  . Drug use: No  . Sexual activity: Yes  Lifestyle  . Physical activity:    Days per week: Not on file    Minutes per session: Not on file  . Stress: Not on file  Relationships  . Social connections:    Talks on phone: Not on file    Gets together: Not on file    Attends religious service:  Not on file    Active member of club or organization: Not on file    Attends meetings of clubs or organizations: Not on file    Relationship status: Not on file  . Intimate partner violence:    Fear of current or ex partner: Not on file    Emotionally abused: Not on file    Physically abused: Not on file    Forced sexual activity: Not on file  Other Topics Concern  . Not on file  Social History Narrative   Married, boy/girl twins age 40yrs.   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   Originally from [place].   No T/A/Ds.   Cardiovasc exercise regularly.  Prudent diet.          Outpatient Medications Prior to Visit  Medication Sig Dispense Refill  . fluticasone (FLONASE) 50 MCG/ACT nasal spray Place 2 sprays into both nostrils daily. 16 g 6  . Krill Oil CAPS Take 1 capsule by mouth.    . metoprolol tartrate (LOPRESSOR) 25 MG tablet TAKE 1 TABLET BY MOUTH TWICE A DAY 180 tablet 1  . Multiple Vitamin (ONE-A-DAY MENS PO) Take by mouth daily.      Marland Kitchen omeprazole (PRILOSEC) 20 MG capsule TAKE ONE CAPSULE BY MOUTH TWICE A DAY 180 capsule 1  . rosuvastatin (CRESTOR) 10 MG tablet TAKE 1 TABLET (10 MG TOTAL) BY MOUTH DAILY. 30 tablet 3  . tamsulosin (FLOMAX) 0.4 MG CAPS capsule Take by mouth.    . Ubiquinol (QUNOL COQ10/UBIQUINOL/MEGA) 100 MG CAPS Take 1 capsule by mouth daily.    . Vitamin D, Ergocalciferol, (DRISDOL) 50000 units CAPS capsule Take 1 capsule (50,000 Units total) by mouth every 7 (seven) days. 12 capsule 0  . amoxicillin-clavulanate (AUGMENTIN) 875-125 MG tablet Take 1 tablet by mouth 2 (two) times daily. 20 tablet 0  . predniSONE (DELTASONE) 50 MG tablet Take 1 tablet (50 mg total) by mouth daily. 5 tablet 0   No facility-administered medications prior to visit.     Allergies  Allergen Reactions  . Simvastatin Other (See Comments)    myalgias  . Atorvastatin Other (See Comments)    Joint pain and low back pain    Review of Systems  Constitutional: Positive for  malaise/fatigue. Negative for fever.  HENT: Negative for congestion.   Eyes: Negative for blurred vision.  Respiratory: Negative for shortness of breath.   Cardiovascular: Negative for chest pain, palpitations and leg swelling.  Gastrointestinal: Negative for abdominal pain, blood in stool and nausea.  Genitourinary: Negative for dysuria and frequency.  Musculoskeletal: Positive for back pain and joint pain. Negative for falls.  Skin: Negative for rash.  Neurological: Negative for dizziness, loss of consciousness and headaches.  Endo/Heme/Allergies: Negative for environmental allergies.  Psychiatric/Behavioral: Negative for depression. The patient is not nervous/anxious.        Objective:    Physical Exam  Constitutional: He is oriented to person, place, and time. He appears well-developed and well-nourished. No distress.  HENT:  Head: Normocephalic and atraumatic.  Nose: Nose normal.  Eyes: Right eye exhibits no discharge. Left eye exhibits no discharge.  Neck: Normal range of motion. Neck supple.  Cardiovascular: Normal rate and regular rhythm.  No murmur heard. Pulmonary/Chest: Effort normal and breath sounds normal.  Abdominal: Soft. Bowel sounds are normal. There is no tenderness.  Musculoskeletal: He exhibits no edema.  Neurological: He is alert and oriented to person, place, and time.  Skin: Skin is warm and dry.  Psychiatric: He has a normal mood and affect.  Nursing note and vitals reviewed.   BP 118/76 (BP Location: Left Arm, Patient Position: Sitting, Cuff Size: Normal)   Pulse 67   Temp 98.7 F (37.1 C) (Oral)   Resp 18   Wt 163 lb 13.6 oz (74.3 kg)   SpO2 98%   BMI 22.85 kg/m  Wt Readings from Last 3 Encounters:  05/02/18 163 lb 13.6 oz (74.3 kg)  04/13/18 165 lb (74.8 kg)  12/15/17 168 lb 12 oz (76.5 kg)   BP Readings from Last 3 Encounters:  05/02/18 118/76  04/13/18 112/78  12/15/17 112/70     Immunization History  Administered Date(s)  Administered  . Influenza Split 09/28/2011, 07/20/2012  . Influenza,inj,Quad PF,6+ Mos 10/29/2015, 09/21/2016    Health Maintenance  Topic Date Due  . HIV Screening  01/18/1983  . TETANUS/TDAP  01/18/1987  . COLONOSCOPY  01/17/2018  . INFLUENZA VACCINE  06/02/2018    Lab Results  Component Value Date   WBC 9.8 09/03/2016   HGB 15.5 09/03/2016   HCT 45.2 09/03/2016   PLT 245.0 09/03/2016   GLUCOSE 95 05/02/2018   CHOL 243 (H) 12/17/2016   TRIG 280.0 (H) 12/17/2016   HDL 39.50 12/17/2016   LDLDIRECT 172.0 12/17/2016   ALT 25 05/02/2018   AST 15 05/02/2018   NA 140 05/02/2018   K 5.0 05/02/2018   CL 103 05/02/2018   CREATININE 0.94 05/02/2018   BUN 17 05/02/2018   CO2 32 05/02/2018   TSH 7.83 (H) 05/02/2018    Lab Results  Component Value Date   TSH 7.83 (H) 05/02/2018   Lab Results  Component Value Date   WBC 9.8 09/03/2016   HGB 15.5 09/03/2016   HCT 45.2 09/03/2016   MCV 86.8 09/03/2016   PLT 245.0 09/03/2016   Lab Results  Component Value Date   NA 140 05/02/2018   K 5.0 05/02/2018   CO2 32 05/02/2018   GLUCOSE 95 05/02/2018   BUN 17 05/02/2018   CREATININE 0.94 05/02/2018   BILITOT 0.5 05/02/2018   ALKPHOS 71 05/02/2018   AST 15 05/02/2018   ALT 25 05/02/2018   PROT 7.1 05/02/2018   ALBUMIN 4.3 05/02/2018   CALCIUM 9.5 05/02/2018   GFR 90.18 05/02/2018   Lab Results  Component Value Date   CHOL 243 (H) 12/17/2016   Lab Results  Component Value Date   HDL 39.50 12/17/2016   No results found for: [facility] Lab Results  Component Value Date   TRIG 280.0 (  H) 12/17/2016   Lab Results  Component Value Date   CHOLHDL 6 12/17/2016   No results found for: HGBA1C       Assessment & Plan:   Problem List Items Addressed This Visit    Hyperlipidemia, mild    Due to the severity of his pain will proceed with a trial of no Rosuvastati to see if that helps his symptoms. Encouraged heart healthy diet, increase exercise, avoid trans fats, consider  a krill oil cap daily      GERD (gastroesophageal reflux disease)    Avoid offending foods, start probiotics. Do not eat large meals in late evening and consider raising head of bed. Will need to take NSAID judiciously with food to avoid a flare.       Arthralgia - Primary    Labs confirm a positive RF. Started on a steroid course and referred to heumatologyf ro further consideration. Given rx for Melxicam 7.5 mg to use bid for pain with food.      Relevant Orders   Antinuclear Antib (ANA) (Completed)   Rheumatoid Factor (Completed)   Rocky mtn spotted fvr abs pnl(IgG+IgM) (Completed)   Ehrlichia antibody panel (Completed)   Uric acid (Completed)   Sedimentation rate (Completed)   B. burgdorfi antibodies (Completed)   Abnormal TSH   Relevant Orders   TSH (Completed)   T4, free (Completed)   Comprehensive metabolic panel (Completed)   ANA positive    Repeat ANA remains positive referred to rheumatolog      Relevant Orders   Antinuclear Antib (ANA) (Completed)   Comprehensive metabolic panel (Completed)   Anti-nuclear ab-titer (ANA titer) (Completed)    Other Visit Diagnoses    Myalgia       Relevant Orders   Antinuclear Antib (ANA) (Completed)   Rheumatoid Factor (Completed)   Rocky mtn spotted fvr abs pnl(IgG+IgM) (Completed)   Ehrlichia antibody panel (Completed)   Uric acid (Completed)   Sedimentation rate (Completed)   Comprehensive metabolic panel (Completed)   B. burgdorfi antibodies (Completed)      I have discontinued [place] "Jon"'s amoxicillin-clavulanate and predniSONE. I am also having him start on predniSONE and meloxicam. Additionally, I am having him maintain his Multiple Vitamin (ONE-A-DAY MENS PO), Krill Oil, fluticasone, Ubiquinol, omeprazole, tamsulosin, rosuvastatin, metoprolol tartrate, and Vitamin D (Ergocalciferol).  Meds ordered this encounter  Medications  . predniSONE (DELTASONE) 20 MG tablet    Sig: Take 2 tablets (40 mg total)  by mouth daily.    Dispense:  10 tablet    Refill:  0  . meloxicam (MOBIC) 7.5 MG tablet    Sig: Take 1 tablet (7.5 mg total) by mouth 2 (two) times daily.    Dispense:  60 tablet    Refill:  3    CMA served as scribe during this visit. History, Physical and Plan performed by medical provider. Documentation and orders reviewed and attested to.  Penni Homans, MD

## 2018-05-02 NOTE — Patient Instructions (Addendum)
If labs today are normal and your pain persists. Take the steroids and avoid the Rosuvastatin for next 2 weeks  If you take the Prednisone do not take the Meloxicam on the same day,   Aleve/Naproxen, Ibuprofen/Advil/Motrin and Meloxicam/Mobic are all antiinflammatory meds in same class cannot take on same day  Tylenol?Acetaminophen can be taken with any of the above. Take twice daily for next month Joint Pain Joint pain can be caused by many things. The joint can be bruised, infected, weak from aging, or sore from exercise. The pain will probably go away if you follow your doctor's instructions for home care. If your joint pain continues, more tests may be needed to help find the cause of your condition. Follow these instructions at home: Watch your condition for any changes. Follow these instructions as told to lessen the pain that you are feeling:  Take medicines only as told by your doctor.  Rest the sore joint for as long as told by your doctor. If your doctor tells you to, raise (elevate) the painful joint above the level of your heart while you are sitting or lying down.  Do not do things that cause pain or make the pain worse.  If told, put ice on the painful area: ? Put ice in a plastic bag. ? Place a towel between your skin and the bag. ? Leave the ice on for 20 minutes, 2-3 times per day.  Wear an elastic bandage, splint, or sling as told by your doctor. Loosen the bandage or splint if your fingers or toes lose feeling (become numb) and tingle, or if they turn cold and blue.  Begin exercising or stretching the joint as told by your doctor. Ask your doctor what types of exercise are safe for you.  Keep all follow-up visits as told by your doctor. This is important.  Contact a doctor if:  Your pain gets worse and medicine does not help it.  Your joint pain does not get better in 3 days.  You have more bruising or swelling.  You have a fever.  You lose 10 pounds (4.5 kg)  or more without trying. Get help right away if:  You are not able to move the joint.  Your fingers or toes become numb or they turn cold and blue. This information is not intended to replace advice given to you by your health care provider. Make sure you discuss any questions you have with your health care provider. Document Released: 10/07/2009 Document Revised: 03/26/2016 Document Reviewed: 07/31/2014 Elsevier Interactive Patient Education  [employer].

## 2018-05-03 ENCOUNTER — Other Ambulatory Visit: Payer: Self-pay | Admitting: Family Medicine

## 2018-05-03 DIAGNOSIS — R768 Other specified abnormal immunological findings in serum: Secondary | ICD-10-CM

## 2018-05-03 DIAGNOSIS — M2550 Pain in unspecified joint: Secondary | ICD-10-CM

## 2018-05-03 MED ORDER — LEVOTHYROXINE SODIUM 25 MCG PO TABS: 25.0000 ug | ORAL_TABLET | Freq: Every day | ORAL | 3 refills | Status: DC

## 2018-05-04 LAB — EHRLICHIA ANTIBODY PANEL
E. CHAFFEENSIS AB IGG: <1:64 {titer}
E. CHAFFEENSIS AB IGM: <1:20 {titer}

## 2018-05-04 LAB — ROCKY MTN SPOTTED FVR ABS PNL(IGG+IGM)
RMSF IGG: NOT DETECTED
RMSF IGM: NOT DETECTED

## 2018-05-04 LAB — RHEUMATOID FACTOR: RHEUMATOID FACTOR: 141 [IU]/mL — AB (ref <14)

## 2018-05-04 LAB — B. BURGDORFI ANTIBODIES: B burgdorferi Ab IgG+IgM: <0.9 index

## 2018-05-04 LAB — ANA: Anti Nuclear Antibody(ANA): POSITIVE — AB

## 2018-05-04 LAB — ANTI-NUCLEAR AB-TITER (ANA TITER): ANA Titer 1: 1:40 {titer} — ABNORMAL HIGH

## 2018-05-06 ENCOUNTER — Encounter: Payer: Self-pay | Admitting: Family Medicine

## 2018-05-08 DIAGNOSIS — E039 Hypothyroidism, unspecified: Secondary | ICD-10-CM | POA: Insufficient documentation

## 2018-05-08 DIAGNOSIS — R7989 Other specified abnormal findings of blood chemistry: Secondary | ICD-10-CM | POA: Insufficient documentation

## 2018-05-08 DIAGNOSIS — R768 Other specified abnormal immunological findings in serum: Secondary | ICD-10-CM | POA: Insufficient documentation

## 2018-05-08 NOTE — Assessment & Plan Note (Addendum)
Labs confirm a positive RF. Started on a steroid course and referred to heumatologyf ro further consideration. Given rx for Melxicam 7.5 mg to use bid for pain with food.

## 2018-05-08 NOTE — Assessment & Plan Note (Signed)
Avoid offending foods, start probiotics. Do not eat large meals in late evening and consider raising head of bed. Will need to take NSAID judiciously with food to avoid a flare.

## 2018-05-08 NOTE — Assessment & Plan Note (Signed)
Due to the severity of his pain will proceed with a trial of no Rosuvastati to see if that helps his symptoms. Encouraged heart healthy diet, increase exercise, avoid trans fats, consider a krill oil cap daily

## 2018-05-08 NOTE — Assessment & Plan Note (Signed)
Repeat ANA remains positive referred to rheumatolog

## 2018-05-10 ENCOUNTER — Other Ambulatory Visit: Payer: Self-pay | Admitting: Family Medicine

## 2018-05-10 ENCOUNTER — Telehealth: Payer: Self-pay | Admitting: Family Medicine

## 2018-05-10 MED ORDER — PREDNISONE 5 MG PO TABS: ORAL_TABLET | ORAL | 0 refills | Status: DC

## 2018-05-10 MED ORDER — PREDNISONE 20 MG PO TABS: ORAL_TABLET | ORAL | 0 refills | Status: DC

## 2018-05-10 NOTE — Telephone Encounter (Signed)
We can change to all the prescriptions being the 5 mg tabs just do the math (ie 20 mg would be 4 of the 5 mg tabs twice daily, etc) or he can use a different pharmacy

## 2018-05-10 NOTE — Telephone Encounter (Signed)
Copied from [place] [id number]. Topic: Quick Communication - Rx Refill/Question >> May 10, 2018  1:07 PM Margot Ables wrote: Medication: prednisone 20mg  - pharmacy called stating they only have th prednisone 5mg  tablets in stock right now. Please advise on changing the RX for the 20mg  tablet.

## 2018-05-10 NOTE — Telephone Encounter (Signed)
Copied from [place] [id number]. Topic: Inquiry >> May 03, 2018  1:41 PM Pricilla Handler wrote: Reason for CRM: Patient called regarding the Levothyroxine 25mg  that Dr. Charlett Blake had sent him a message concerning. I called and spoke with Miss Juliann Pulse. Levothyroxine is to be sent to patient's pharmacy this afternoon.       Thank You!!!

## 2018-05-16 DIAGNOSIS — K210 Gastro-esophageal reflux disease with esophagitis: Secondary | ICD-10-CM | POA: Diagnosis not present

## 2018-05-16 DIAGNOSIS — E039 Hypothyroidism, unspecified: Secondary | ICD-10-CM | POA: Diagnosis not present

## 2018-05-16 DIAGNOSIS — R768 Other specified abnormal immunological findings in serum: Secondary | ICD-10-CM | POA: Diagnosis not present

## 2018-05-16 DIAGNOSIS — M2550 Pain in unspecified joint: Secondary | ICD-10-CM | POA: Diagnosis not present

## 2018-05-17 NOTE — Telephone Encounter (Signed)
Medication was sent in for the 5mg  on 05/10/18

## 2018-05-20 ENCOUNTER — Telehealth: Payer: Self-pay | Admitting: Family Medicine

## 2018-05-20 NOTE — Telephone Encounter (Signed)
Copied from [place] [id number]. Topic: Quick Communication - See Telephone Encounter >> May 20, 2018  9:34 AM Mylinda Latina, NT wrote: CRM for notification. See Telephone encounter for: 05/20/18. Patient called and states that he went to the Rheumatologist and they prescribed him methotrexate and he is also taking omeprazole ([place]) . He states the pharmacist told him the combination of the 2 drugs is dangerous to take together. He states that the patient should be precribed Zantac which Is safer. He states can he get a RX for Zantac. Please send.. He states that he is going to stop the Omeprazole and he is not going to start the Methotrexate until Monday. He is requesting a call back when the RX is sent to the pharmacy CB# [phone]   [id number] - [place], [place] [phone] (Phone) [phone] (Fax)

## 2018-05-20 NOTE — Telephone Encounter (Signed)
Please advise 

## 2018-05-22 ENCOUNTER — Other Ambulatory Visit: Payer: Self-pay | Admitting: Family Medicine

## 2018-05-22 MED ORDER — RANITIDINE HCL 300 MG PO TABS: 300.0000 mg | ORAL_TABLET | Freq: Every day | ORAL | 5 refills | Status: DC

## 2018-05-22 NOTE — Telephone Encounter (Signed)
I sent Ranitidine 300 mg to pharmacy for him. He could have used over the counter Zantac for the weekend.

## 2018-06-02 ENCOUNTER — Telehealth: Payer: Self-pay | Admitting: Family Medicine

## 2018-06-02 NOTE — Telephone Encounter (Signed)
Copied from [place] [id number]. Topic: Quick Communication - See Telephone Encounter >> Jun 02, 2018  1:53 PM Synthia Innocent wrote: CRM for notification. See Telephone encounter for: 06/02/18. ranitidine (ZANTAC) 300 MG tablet is not working for patient, is he still suppose to be taking omeprazole (PRILOSEC OTC) 20 MG tablet? Rheumatologist placed him on methotrexate15mg  on Monday only, ok to take omperazole with methotrexate?

## 2018-06-02 NOTE — Telephone Encounter (Signed)
Called patient left message for him to call his pharmacy to see if there were any contraindications with taking the 2 medications in question. Asked him to call the office if so and we can let provider know.

## 2018-06-28 DIAGNOSIS — M791 Myalgia, unspecified site: Secondary | ICD-10-CM | POA: Diagnosis not present

## 2018-07-01 ENCOUNTER — Ambulatory Visit (HOSPITAL_BASED_OUTPATIENT_CLINIC_OR_DEPARTMENT_OTHER)
Admission: RE | Admit: 2018-07-01 | Discharge: 2018-07-01 | Disposition: A | Discharge status: Home / Self Care | Payer: BLUE CROSS/BLUE SHIELD | Source: Ambulatory Visit | Attending: Family Medicine | Admitting: Family Medicine

## 2018-07-01 ENCOUNTER — Ambulatory Visit: Payer: BLUE CROSS/BLUE SHIELD | Admitting: Family Medicine

## 2018-07-01 VITALS — BP 104/68 | HR 93 | Temp 98.6°F | Resp 18 | Ht 70.0 in | Wt 158.0 lb

## 2018-07-01 DIAGNOSIS — J029 Acute pharyngitis, unspecified: Secondary | ICD-10-CM | POA: Diagnosis not present

## 2018-07-01 DIAGNOSIS — E785 Hyperlipidemia, unspecified: Secondary | ICD-10-CM | POA: Diagnosis not present

## 2018-07-01 DIAGNOSIS — R7989 Other specified abnormal findings of blood chemistry: Secondary | ICD-10-CM

## 2018-07-01 DIAGNOSIS — R05 Cough: Secondary | ICD-10-CM | POA: Insufficient documentation

## 2018-07-01 DIAGNOSIS — E038 Other specified hypothyroidism: Secondary | ICD-10-CM

## 2018-07-01 DIAGNOSIS — M791 Myalgia, unspecified site: Secondary | ICD-10-CM | POA: Diagnosis not present

## 2018-07-01 DIAGNOSIS — R0981 Nasal congestion: Secondary | ICD-10-CM | POA: Diagnosis not present

## 2018-07-01 DIAGNOSIS — E039 Hypothyroidism, unspecified: Secondary | ICD-10-CM

## 2018-07-01 DIAGNOSIS — R059 Cough, unspecified: Secondary | ICD-10-CM

## 2018-07-01 DIAGNOSIS — K219 Gastro-esophageal reflux disease without esophagitis: Secondary | ICD-10-CM

## 2018-07-01 DIAGNOSIS — R509 Fever, unspecified: Secondary | ICD-10-CM

## 2018-07-01 DIAGNOSIS — R768 Other specified abnormal immunological findings in serum: Secondary | ICD-10-CM

## 2018-07-01 LAB — CBC WITH DIFFERENTIAL/PLATELET
BASOS PCT: 0.9 % (ref 0.0–3.0)
Basophils Absolute: 0.1 10*3/uL (ref 0.0–0.1)
EOS PCT: 2.4 % (ref 0.0–5.0)
Eosinophils Absolute: 0.3 10*3/uL (ref 0.0–0.7)
HCT: 38.4 % — ABNORMAL LOW (ref 39.0–52.0)
Hemoglobin: 12.7 g/dL — ABNORMAL LOW (ref 13.0–17.0)
LYMPHS ABS: 1.2 10*3/uL (ref 0.7–4.0)
Lymphocytes Relative: 9 % — ABNORMAL LOW (ref 12.0–46.0)
MCHC: 33.2 g/dL (ref 30.0–36.0)
MCV: 88.1 fl (ref 78.0–100.0)
MONO ABS: 1 10*3/uL (ref 0.1–1.0)
Monocytes Relative: 6.9 % (ref 3.0–12.0)
NEUTROS PCT: 80.8 % — AB (ref 43.0–77.0)
Neutro Abs: 11.1 10*3/uL — ABNORMAL HIGH (ref 1.4–7.7)
Platelets: 342 10*3/uL (ref 150.0–400.0)
RBC: 4.36 Mil/uL (ref 4.22–5.81)
RDW: 15 % (ref 11.5–15.5)
WBC: 13.8 10*3/uL — ABNORMAL HIGH (ref 4.0–10.5)

## 2018-07-01 LAB — COMPREHENSIVE METABOLIC PANEL
ALBUMIN: 3.6 g/dL (ref 3.5–5.2)
ALK PHOS: 143 U/L — AB (ref 39–117)
ALT: 64 U/L — ABNORMAL HIGH (ref 0–53)
AST: 26 U/L (ref 0–37)
BUN: 18 mg/dL (ref 6–23)
CALCIUM: 8.9 mg/dL (ref 8.4–10.5)
CO2: 29 mEq/L (ref 19–32)
CREATININE: 0.99 mg/dL (ref 0.40–1.50)
Chloride: 101 mEq/L (ref 96–112)
GFR: 84.89 mL/min (ref >60.00)
Glucose, Bld: 104 mg/dL — ABNORMAL HIGH (ref 70–99)
POTASSIUM: 3.9 meq/L (ref 3.5–5.1)
SODIUM: 139 meq/L (ref 135–145)
TOTAL PROTEIN: 6.6 g/dL (ref 6.0–8.3)
Total Bilirubin: 0.8 mg/dL (ref 0.2–1.2)

## 2018-07-01 LAB — T4, FREE: Free T4: 0.9 ng/dL (ref 0.60–1.60)

## 2018-07-01 LAB — T3, FREE: T3, Free: 3.1 pg/mL (ref 2.3–4.2)

## 2018-07-01 LAB — TSH: TSH: 3.01 u[IU]/mL (ref 0.35–4.50)

## 2018-07-01 LAB — CK: Total CK: 22 U/L (ref 7–232)

## 2018-07-01 LAB — SEDIMENTATION RATE: Sed Rate: 93 mm/hr — ABNORMAL HIGH (ref 0–20)

## 2018-07-01 MED ORDER — CEFDINIR 300 MG PO CAPS: 300.0000 mg | ORAL_CAPSULE | Freq: Two times a day (BID) | ORAL | 0 refills | Status: AC

## 2018-07-01 NOTE — Patient Instructions (Addendum)
Max tylenol/acetaminophen 3000 mg in 24 hours. So the Extra Strength tabs are 500 mg which means 6 days Max Advil/Motrin/Ibuprofen 200 mg tabs, tax of 2400 mg in 24 hours. 2 tabs every 6 Muscle Pain, Adult Muscle pain (myalgia) may be mild or severe. In most cases, the pain lasts only a short time and it goes away without treatment. It is normal to feel some muscle pain after starting a workout program. Muscles that have not been used often will be sore at first. Muscle pain may also be caused by many other things, including:  Overuse or muscle strain, especially if you are not in shape. This is the most common cause of muscle pain.  Injury.  Bruises.  Viruses, such as the flu.  Infectious diseases.  A chronic condition that causes muscle tenderness, fatigue, and headache (fibromyalgia).  A condition, such as lupus, in which the body's disease-fighting system attacks other organs in the body (autoimmune or rheumatologic diseases).  Certain drugs, including ACE inhibitors and statins.  To diagnose the cause of your muscle pain, your health care provider will do a physical exam and ask questions about the pain and when it began. If you have not had muscle pain for very long, your health care provider may want to wait before doing much testing. If your muscle pain has lasted a long time, your health care provider may want to run tests right away. In some cases, this may include tests to rule out certain conditions or illnesses. Treatment for muscle pain depends on the cause. Home care is often enough to relieve muscle pain. Your health care provider may also prescribe anti-inflammatory medicine. Follow these instructions at home: Activity  If overuse is causing your muscle pain: ? Slow down your activities until the pain goes away. ? Do regular, gentle exercises if you are not usually active. ? Warm up before exercising. Stretch before and after exercising. This can help lower the risk of  muscle pain.  Do not continue working out if the pain is very bad. Bad pain could mean that you have injured a muscle. Managing pain and discomfort   If directed, apply ice to the sore muscle: ? Put ice in a plastic bag. ? Place a towel between your skin and the bag. ? Leave the ice on for 20 minutes, 2-3 times a day.  You may also alternate between applying ice and applying heat as told by your health care provider. To apply heat, use the heat source that your health care provider recommends, such as a moist heat pack or a heating pad. ? Place a towel between your skin and the heat source. ? Leave the heat on for 20-30 minutes. ? Remove the heat if your skin turns bright red. This is especially important if you are unable to feel pain, heat, or cold. You may have a greater risk of getting burned. Medicines  Take over-the-counter and prescription medicines only as told by your health care provider.  Do not drive or use heavy machinery while taking prescription pain medicine. Contact a health care provider if:  Your muscle pain gets worse and medicines do not help.  You have muscle pain that lasts longer than 3 days.  You have a rash or fever along with muscle pain.  You have muscle pain after a tick bite.  You have muscle pain while working out, even though you are in good physical condition.  You have redness, soreness, or swelling along with muscle pain.  You have muscle pain after starting a new medicine or changing the dose of a medicine. Get help right away if:  You have trouble breathing.  You have trouble swallowing.  You have muscle pain along with a stiff neck, fever, and vomiting.  You have severe muscle weakness or cannot move part of your body. This information is not intended to replace advice given to you by your health care provider. Make sure you discuss any questions you have with your health care provider. Document Released: 09/10/2006 Document Revised:  05/08/2016 Document Reviewed: 03/10/2016 Elsevier Interactive Patient Education  2018 [employer].  hours

## 2018-07-02 IMAGING — CT CT MAXILLOFACIAL W/O CM
1 series · 15 of 30 positions shown, 19 images · non-contrast
Comparison: Cervical spine MRI 10/16/2009. Orbit radiographs
10/16/2009.

CLINICAL DATA: 50-year-old male with sinusitis symptoms for 3
months status post 2 rounds of antibiotics. Initial encounter.

EXAM:
CT MAXILLOFACIAL WITHOUT CONTRAST
TECHNIQUE: Multidetector CT imaging of the maxillofacial structures was
performed. Multiplanar CT image reconstructions were also generated.
A small metallic BB was placed on the right temple in order to
reliably differentiate right from left.

[Series 5: soft tissue · axial · 0.53mm/px · z∈[-414,-237]mm · 15 of 191 slices shown, 19 images]
[im 7/191  brain]
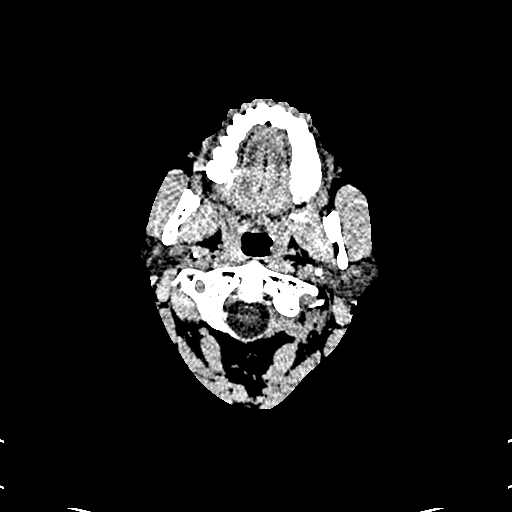
[im 7/191  bone]
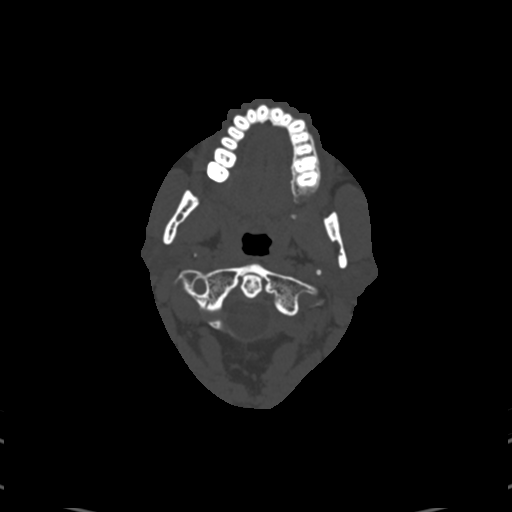
[im 20/191  bone]
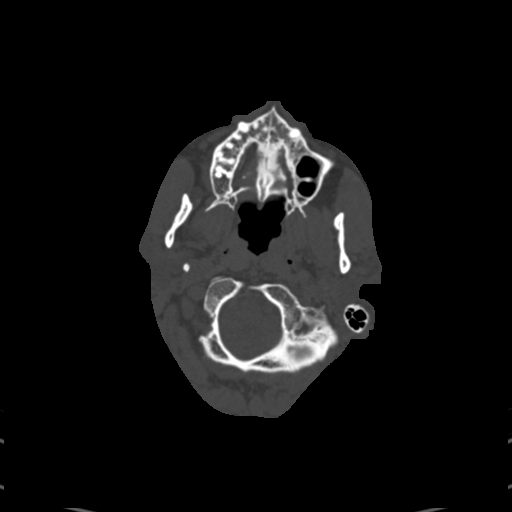
[im 33/191  bone]
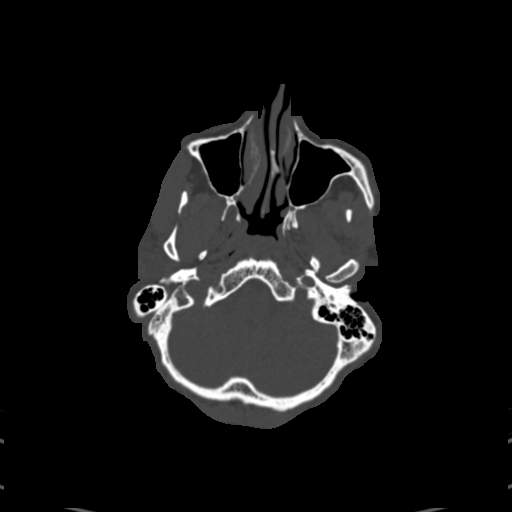
[im 46/191  bone]
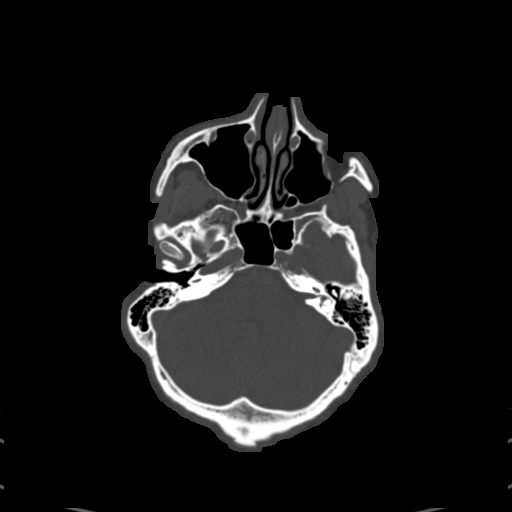
[im 59/191  brain]
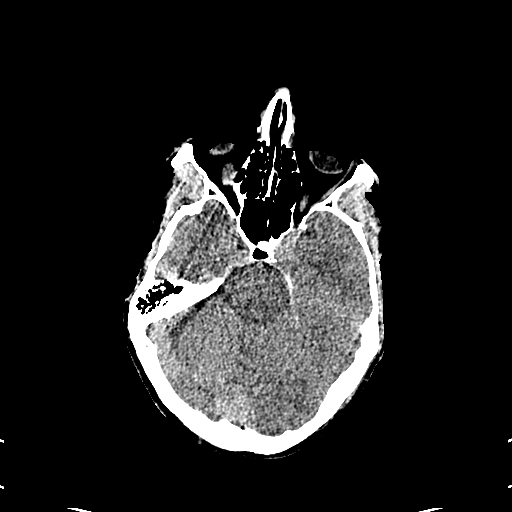
[im 59/191  bone]
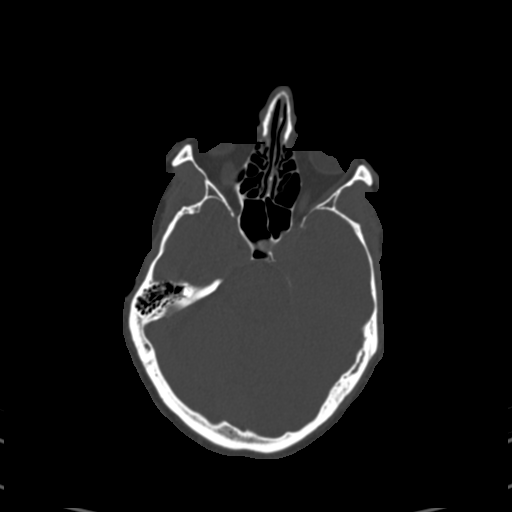
[im 66/191  bone]
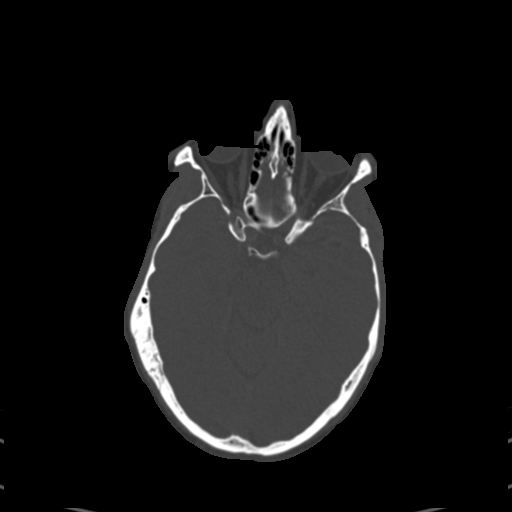
[im 79/191  bone]
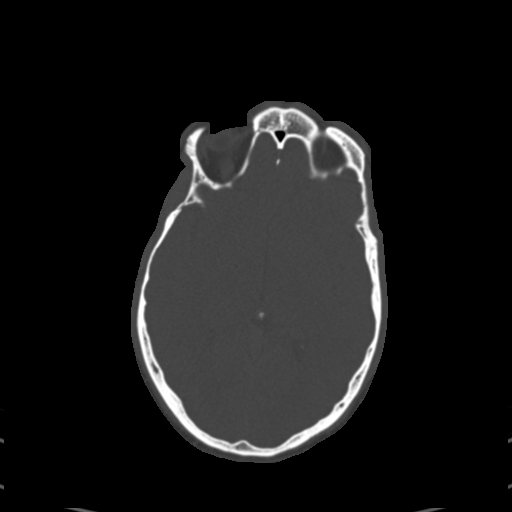
[im 92/191  bone]
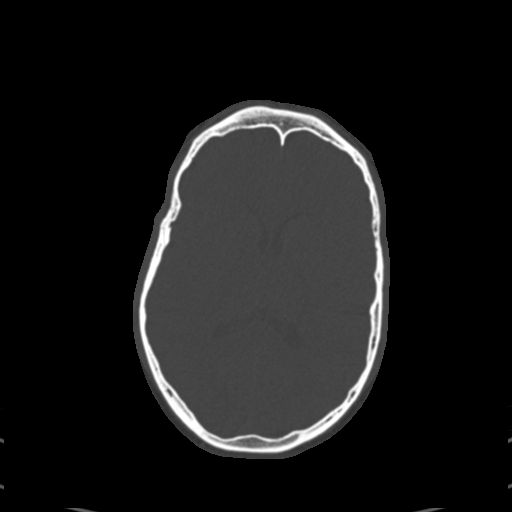
[im 105/191  brain]
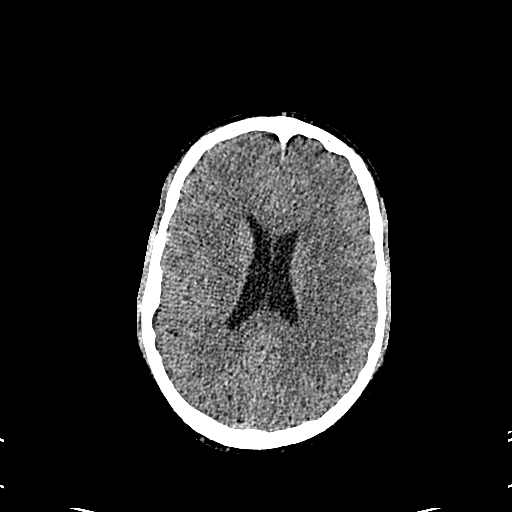
[im 105/191  bone]
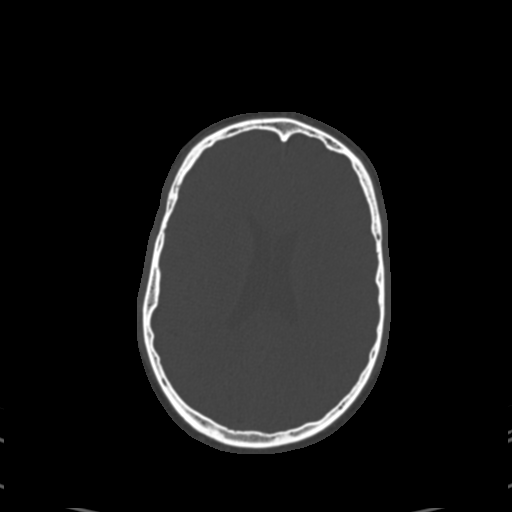
[im 118/191  bone]
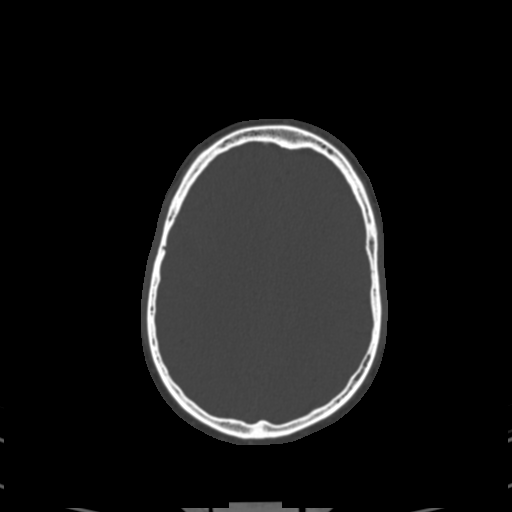
[im 125/191  bone]
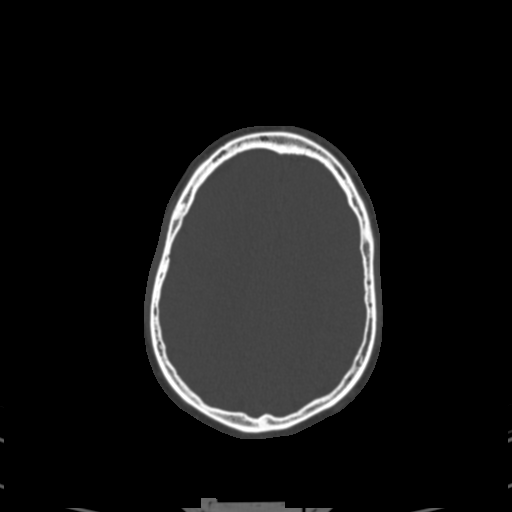
[im 138/191  bone]
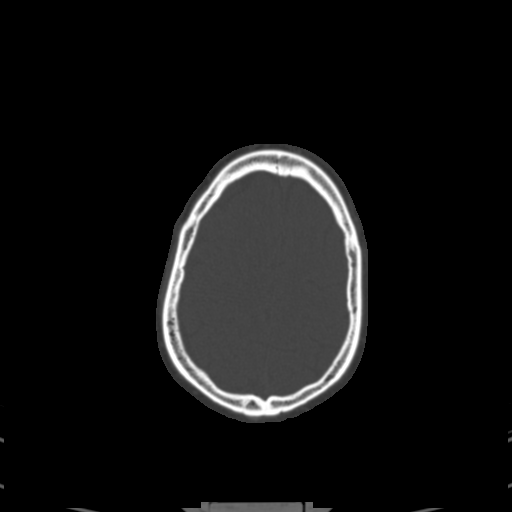
[im 151/191  brain]
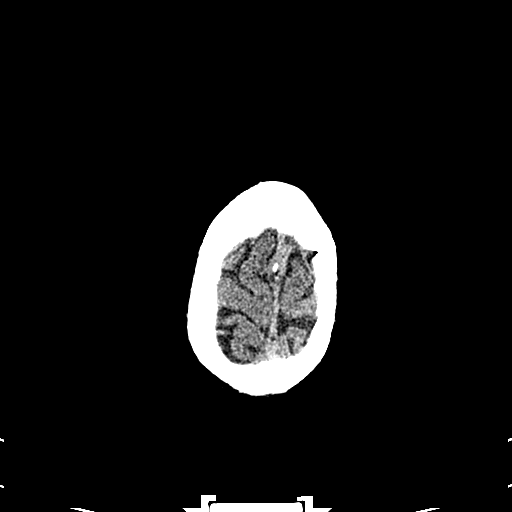
[im 151/191  bone]
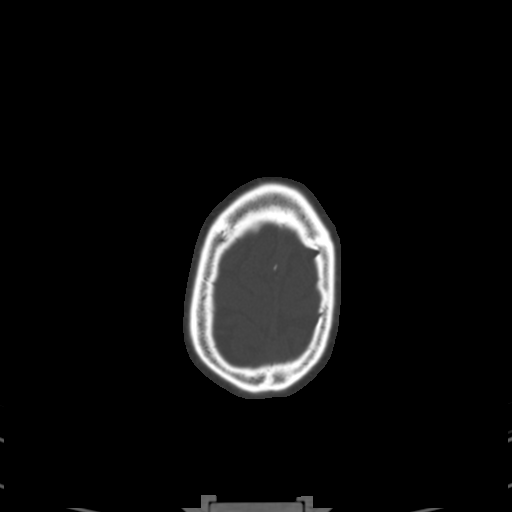
[im 164/191  bone]
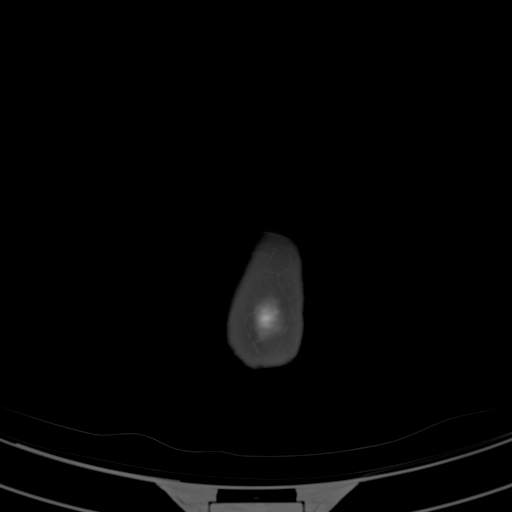
[im 184/191  bone]
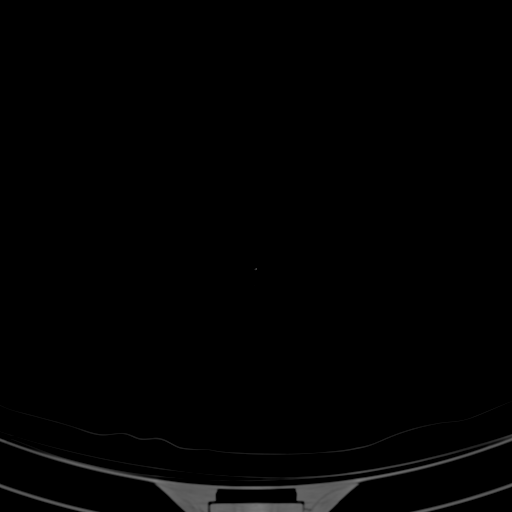

[15 of 30 positions shown; findings below may reference images not displayed]

FINDINGS: Osseous: No acute osseous abnormality identified.

Orbits: Normal noncontrast orbit soft tissues.

Sinuses:

Bilateral tympanic cavities and mastoid air cells are clear.

Both sphenoid sinuses are clear.  Normal sphenoid ostia.

Bilateral ethmoid air cells are clear. Anterior ethmoidal artery
location suspected on coronal image 36 on the left and image 35 on
the right.

Both frontal sinuses are hypoplastic and clear.

Both maxillary sinuses are clear.

Bilateral OMCs are patent (coronal images 30 and 31).

Leftward nasal septal deviation and spurring. Fairly symmetric
appearance of nasal cavity mucosal thickening.

Soft tissues: Negative visualized noncontrast deep soft tissue
spaces of the face. Visualized Face and scalp soft tissues are
within normal limits.

Limited intracranial: Negative noncontrast brain parenchyma.
IMPRESSION: 1. Negative paranasal sinuses.
2. Leftward nasal septal deviation and spurring. Symmetric
appearance of nasal cavity mucosal thickening raising the
possibility of rhinitis.

## 2018-07-04 DIAGNOSIS — R509 Fever, unspecified: Secondary | ICD-10-CM | POA: Insufficient documentation

## 2018-07-04 NOTE — Progress Notes (Signed)
Subjective:    Patient ID: Tyler Schmidt, male    DOB: 02/05/1966, 52 y.o.   MRN: [id number]  No chief complaint on file.   HPI Patient is in today for evaluation of  Febrile illness With myalgias, fatigue, cough and malaise.he was seen recently with sudden onset of arthalgias, stated on Prednisone and referred to rheumatology. He notes a good response to meds for these symptoms. Then this week he developed fevers, myalgias, malaise and dry cough. He notes cramps are very notable and he gets some relief with tylenol and ibuprofen temporarily but then symptoms return. He has noted muscles in jaw and neck are very tight and he has noted some enlarged lymph nodes. His cough is dry. Denies CP/palp/SOB/GI or GU c/o. Taking meds as prescribed  Past Medical History:  Diagnosis Date  . Adrenal adenoma, left    Incidentaloma noted on noncontrast chest CT done to f/u lung nodule seen on chest radiograph (10/2016).  . Allergy   . Anxiety 08/12/2012  . Atypical chest pain Fall 2013   Stress echo NORMAL 08/30/12  . Barrett esophagus 2012  . GERD (gastroesophageal reflux disease)   . Hyperlipidemia    HDL high, LDL high: simvastatin resulted in prolonged/severe muscle pain, led to long w/u and pt wants to avoid further statin unless lipids severely worsen  . Neck pain 08/12/2012  . Nephrolithiasis 10/2016   Bilat, nonobstruct, noted on CT chest done for f/u of pulm nodule seen on chest radiograph  . Palpitations Fall/winter 2017   Improved with lopressor  . Subclinical hypothyroidism    Patient give hx of hypfunctioning goiter in the distant past, says he was given pills to "kill" the goiter.  Says bx of goiter was benign.    Past Surgical History:  Procedure Laterality Date  . 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 Doreatha Lew)  . CARDIOVASCULAR STRESS TEST  08/2012   Stress echo normal.  Pt also reports normal ETT 09/2016.  Marland Kitchen  ESOPHAGOGASTRODUODENOSCOPY  09/2011   Dr. Olevia Perches  . HOLTER MONITOR  10/2016   No pathologic arrhythmias.  Occ PVCs that occ corresponded to his palpitations.      Family History  Problem Relation Age of Onset  . Heart disease Father        Died of MI age 77, possible PE  . Heart disease Paternal Uncle        MI's in his 65s  . Heart disease Paternal Uncle   . Colon cancer Neg Hx     Social History   Socioeconomic History  . Marital status: Married    Spouse name: Not on file  . Number of children: 2  . Years of education: Not on file  . Highest education level: Not on file  Occupational History    Employer: [employer]    Comment: Pilot  Social Needs  . Financial resource strain: Not on file  . Food insecurity:    Worry: Not on file    Inability: Not on file  . Transportation needs:    Medical: Not on file    Non-medical: Not on file  Tobacco Use  . Smoking status: Former Smoker    Types: Cigarettes    Last attempt to quit: 08/18/1979    Years since quitting: 38.9  . Smokeless tobacco: Former Systems developer    Types: Chew  . Tobacco comment: has not used since age 75  Substance and Sexual Activity  .  Alcohol use: No    Alcohol/week: 0.0 standard drinks  . Drug use: No  . Sexual activity: Yes  Lifestyle  . Physical activity:    Days per week: Not on file    Minutes per session: Not on file  . Stress: Not on file  Relationships  . Social connections:    Talks on phone: Not on file    Gets together: Not on file    Attends religious service: Not on file    Active member of club or organization: Not on file    Attends meetings of clubs or organizations: Not on file    Relationship status: Not on file  . Intimate partner violence:    Fear of current or ex partner: Not on file    Emotionally abused: Not on file    Physically abused: Not on file    Forced sexual activity: Not on file  Other Topics Concern  . Not on file  Social History Narrative   Married, boy/girl twins  age 30yrs.   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   Originally from [place].   No T/A/Ds.   Cardiovasc exercise regularly.  Prudent diet.          Outpatient Medications Prior to Visit  Medication Sig Dispense Refill  . fluticasone (FLONASE) 50 MCG/ACT nasal spray Place 2 sprays into both nostrils daily. 16 g 6  . Krill Oil CAPS Take 1 capsule by mouth.    . levothyroxine (SYNTHROID, LEVOTHROID) 25 MCG tablet Take 1 tablet (25 mcg total) by mouth daily before breakfast. 30 tablet 3  . meloxicam (MOBIC) 7.5 MG tablet Take 1 tablet (7.5 mg total) by mouth 2 (two) times daily. 60 tablet 3  . metoprolol tartrate (LOPRESSOR) 25 MG tablet TAKE 1 TABLET BY MOUTH TWICE A DAY 180 tablet 1  . Multiple Vitamin (ONE-A-DAY MENS PO) Take by mouth daily.      . predniSONE (DELTASONE) 20 MG tablet 20 mg po bid x 7 days then 20 mg in am and 10 mg in pm x 7 days then 10 mg bid x 7 days then 10 mg qd x 7 days 36 tablet 0  . predniSONE (DELTASONE) 5 MG tablet Start after 20 mg tabs completed 1 tab po daily x 7 days then 1/2 tab po daily x 7 days and quit 11 tablet 0  . ranitidine (ZANTAC) 300 MG tablet Take 1 tablet (300 mg total) by mouth at bedtime. 30 tablet 5  . rosuvastatin (CRESTOR) 10 MG tablet TAKE 1 TABLET (10 MG TOTAL) BY MOUTH DAILY. 30 tablet 3  . tamsulosin (FLOMAX) 0.4 MG CAPS capsule Take by mouth.    . Ubiquinol (QUNOL COQ10/UBIQUINOL/MEGA) 100 MG CAPS Take 1 capsule by mouth daily.    . Vitamin D, Ergocalciferol, (DRISDOL) 50000 units CAPS capsule Take 1 capsule (50,000 Units total) by mouth every 7 (seven) days. 12 capsule 0   No facility-administered medications prior to visit.     Allergies  Allergen Reactions  . Simvastatin Other (See Comments)    myalgias  . Atorvastatin Other (See Comments)    Joint pain and low back pain    Review of Systems  Constitutional: Positive for fever and malaise/fatigue.  HENT: Positive for congestion.   Eyes: Negative for blurred vision.  Respiratory:  Negative for shortness of breath.   Cardiovascular: Negative for chest pain, palpitations and leg swelling.  Gastrointestinal: Negative for abdominal pain, blood in stool and nausea.  Genitourinary: Negative for dysuria and  frequency.  Musculoskeletal: Positive for joint pain and myalgias. Negative for falls.  Skin: Negative for rash.  Neurological: Negative for dizziness, loss of consciousness and headaches.  Endo/Heme/Allergies: Negative for environmental allergies.  Psychiatric/Behavioral: Negative for depression. The patient is not nervous/anxious.        Objective:    Physical Exam  Constitutional: He is oriented to person, place, and time. He appears well-developed and well-nourished. No distress.  HENT:  Head: Normocephalic and atraumatic.  Nose: Nose normal.  Eyes: Right eye exhibits no discharge. Left eye exhibits no discharge.  Neck: Normal range of motion. Neck supple.  Cardiovascular: Normal rate and regular rhythm.  No murmur heard. Pulmonary/Chest: Effort normal and breath sounds normal.  Abdominal: Soft. Bowel sounds are normal. He exhibits no mass. There is no tenderness. There is no guarding.  Musculoskeletal: He exhibits no edema.  Lymphadenopathy:    He has cervical adenopathy.  Neurological: He is alert and oriented to person, place, and time.  Skin: Skin is warm and dry.  Psychiatric: He has a normal mood and affect.  Nursing note and vitals reviewed.   BP 104/68 (BP Location: Left Arm, Patient Position: Sitting, Cuff Size: Normal)   Pulse 93   Temp 98.6 F (37 C) (Oral)   Resp 18   Ht 5\' 10"  (1.778 m)   Wt 158 lb (71.7 kg)   SpO2 97%   BMI 22.67 kg/m  Wt Readings from Last 3 Encounters:  07/01/18 158 lb (71.7 kg)  05/02/18 163 lb 13.6 oz (74.3 kg)  04/13/18 165 lb (74.8 kg)     Lab Results  Component Value Date   WBC 13.8 (H) 07/01/2018   HGB 12.7 (L) 07/01/2018   HCT 38.4 (L) 07/01/2018   PLT 342.0 07/01/2018   GLUCOSE 104 (H) 07/01/2018    CHOL 243 (H) 12/17/2016   TRIG 280.0 (H) 12/17/2016   HDL 39.50 12/17/2016   LDLDIRECT 172.0 12/17/2016   ALT 64 (H) 07/01/2018   AST 26 07/01/2018   NA 139 07/01/2018   K 3.9 07/01/2018   CL 101 07/01/2018   CREATININE 0.99 07/01/2018   BUN 18 07/01/2018   CO2 29 07/01/2018   TSH 3.01 07/01/2018    Lab Results  Component Value Date   TSH 3.01 07/01/2018   Lab Results  Component Value Date   WBC 13.8 (H) 07/01/2018   HGB 12.7 (L) 07/01/2018   HCT 38.4 (L) 07/01/2018   MCV 88.1 07/01/2018   PLT 342.0 07/01/2018   Lab Results  Component Value Date   NA 139 07/01/2018   K 3.9 07/01/2018   CO2 29 07/01/2018   GLUCOSE 104 (H) 07/01/2018   BUN 18 07/01/2018   CREATININE 0.99 07/01/2018   BILITOT 0.8 07/01/2018   ALKPHOS 143 (H) 07/01/2018   AST 26 07/01/2018   ALT 64 (H) 07/01/2018   PROT 6.6 07/01/2018   ALBUMIN 3.6 07/01/2018   CALCIUM 8.9 07/01/2018   GFR 84.89 07/01/2018   Lab Results  Component Value Date   CHOL 243 (H) 12/17/2016   Lab Results  Component Value Date   HDL 39.50 12/17/2016   No results found for: [facility] Lab Results  Component Value Date   TRIG 280.0 (H) 12/17/2016   Lab Results  Component Value Date   CHOLHDL 6 12/17/2016   No results found for: HGBA1C     Assessment & Plan:   Problem List Items Addressed This Visit    Subclinical hypothyroidism   Relevant Orders  Thyroid peroxidase antibody   TSH (Completed)   T4, free (Completed)   T3, free (Completed)   Hyperlipidemia, mild    Will hold Crestor for now to see if that is contributing to his myalgias.       Relevant Orders   CK (Creatine Kinase) (Completed)   GERD (gastroesophageal reflux disease)    Avoid offending foods, start probiotics. Do not eat large meals in late evening and consider raising head of bed.       Abnormal TSH    Normalized on recheck      ANA positive    Good response to prednisone and following with dermatology now.       Fever -  Primary    With myalgias, fatigue, cough and malaise. Labs show an elevated sed rate, wbc and now liver functions as well. All consistent with acute viral illness. Will check some titers and repeat labs next week to monitor for resolution. Seek care if worsens. Hydrate well, eat simple carbs and lean proteins and rest. Due to immune compromised state secondary to steroids will give an empiric course of antibiotics.       Relevant Orders   Epstein-Barr virus VCA, IgG   Epstein-Barr virus VCA, IgM   RSV(respiratory syncytial virus) ab   CMV IgM   CBC w/Diff (Completed)   DG Chest 2 View (Completed)    Other Visit Diagnoses    Cough       Relevant Orders   CBC w/Diff (Completed)   DG Chest 2 View (Completed)   Myalgia       Relevant Orders   Sedimentation rate (Completed)   Comprehensive metabolic panel (Completed)   Head congestion          I am having Clide Cliff "Wille Glaser" start on cefdinir. I am also having him maintain his Multiple Vitamin (ONE-A-DAY MENS PO), Krill Oil, fluticasone, Ubiquinol, tamsulosin, rosuvastatin, metoprolol tartrate, Vitamin D (Ergocalciferol), meloxicam, levothyroxine, predniSONE, predniSONE, and ranitidine.  Meds ordered this encounter  Medications  . cefdinir (OMNICEF) 300 MG capsule    Sig: Take 1 capsule (300 mg total) by mouth 2 (two) times daily for 10 days.    Dispense:  20 capsule    Refill:  0     Penni Homans, MD

## 2018-07-04 NOTE — Assessment & Plan Note (Signed)
Good response to prednisone and following with dermatology now.

## 2018-07-04 NOTE — Assessment & Plan Note (Addendum)
With myalgias, fatigue, cough and malaise. Labs show an elevated sed rate, wbc and now liver functions as well. All consistent with acute viral illness. Will check some titers and repeat labs next week to monitor for resolution. Seek care if worsens. Hydrate well, eat simple carbs and lean proteins and rest. Due to immune compromised state secondary to steroids will give an empiric course of antibiotics.

## 2018-07-04 NOTE — Assessment & Plan Note (Signed)
Normalized on recheck 

## 2018-07-04 NOTE — Assessment & Plan Note (Signed)
Will hold Crestor for now to see if that is contributing to his myalgias.

## 2018-07-04 NOTE — Assessment & Plan Note (Signed)
Avoid offending foods, start probiotics. Do not eat large meals in late evening and consider raising head of bed.  

## 2018-07-05 LAB — EPSTEIN-BARR VIRUS VCA, IGG: EBV VCA IGG: 27.5 U/mL — AB

## 2018-07-05 LAB — EPSTEIN-BARR VIRUS VCA, IGM

## 2018-07-05 LAB — CMV IGM: CMV IgM: <30 AU/mL

## 2018-07-05 LAB — THYROID PEROXIDASE ANTIBODY: Thyroperoxidase Ab SerPl-aCnc: 18 IU/mL — ABNORMAL HIGH (ref <9)

## 2018-07-05 NOTE — Addendum Note (Signed)
Addended by: Magdalene Molly A on: 07/05/2018 07:29 AM   Modules accepted: Orders

## 2018-07-06 ENCOUNTER — Telehealth: Payer: Self-pay

## 2018-07-06 DIAGNOSIS — R509 Fever, unspecified: Secondary | ICD-10-CM

## 2018-07-06 LAB — RSV(RESPIRATORY SYNCYTIAL VIRUS) AB, BLOOD

## 2018-07-06 LAB — CULTURE, GROUP A STREP
MICRO NUMBER: 91048906
SPECIMEN QUALITY: ADEQUATE

## 2018-07-06 NOTE — Telephone Encounter (Signed)
Copied from [place] [id number]. Topic: Appointment Scheduling - Scheduling Inquiry for Clinic >> Jul 05, 2018  2:24 PM Sheran Luz wrote: Reason for CRM: Pt called stating that Dr Charlett Blake told him via mychart that he needs some repeat blood work done but wasn't sure which/when he needs to have them. Pt would like a call back for clarification.

## 2018-07-07 DIAGNOSIS — R29898 Other symptoms and signs involving the musculoskeletal system: Secondary | ICD-10-CM | POA: Diagnosis not present

## 2018-07-07 DIAGNOSIS — E039 Hypothyroidism, unspecified: Secondary | ICD-10-CM | POA: Diagnosis not present

## 2018-07-07 DIAGNOSIS — K210 Gastro-esophageal reflux disease with esophagitis: Secondary | ICD-10-CM | POA: Diagnosis not present

## 2018-07-07 DIAGNOSIS — M2550 Pain in unspecified joint: Secondary | ICD-10-CM | POA: Diagnosis not present

## 2018-07-07 DIAGNOSIS — R768 Other specified abnormal immunological findings in serum: Secondary | ICD-10-CM | POA: Diagnosis not present

## 2018-07-10 ENCOUNTER — Encounter: Payer: Self-pay | Admitting: Family Medicine

## 2018-07-10 DIAGNOSIS — R509 Fever, unspecified: Secondary | ICD-10-CM

## 2018-07-10 DIAGNOSIS — M791 Myalgia, unspecified site: Secondary | ICD-10-CM

## 2018-07-11 ENCOUNTER — Other Ambulatory Visit: Payer: Self-pay | Admitting: Family Medicine

## 2018-07-13 ENCOUNTER — Encounter: Payer: Self-pay | Admitting: Neurology

## 2018-07-13 ENCOUNTER — Encounter: Payer: Self-pay | Admitting: Gastroenterology

## 2018-07-14 ENCOUNTER — Other Ambulatory Visit: Payer: Self-pay | Admitting: *Deleted

## 2018-07-14 DIAGNOSIS — R29898 Other symptoms and signs involving the musculoskeletal system: Secondary | ICD-10-CM

## 2018-07-21 ENCOUNTER — Ambulatory Visit (INDEPENDENT_AMBULATORY_CARE_PROVIDER_SITE_OTHER): Payer: BLUE CROSS/BLUE SHIELD | Admitting: Neurology

## 2018-07-21 DIAGNOSIS — R29898 Other symptoms and signs involving the musculoskeletal system: Secondary | ICD-10-CM

## 2018-07-21 NOTE — Procedures (Signed)
[facility]  [address], [place]  [place], [place] [address] Tel: [phone] Fax:  [phone] Test Date:  07/21/2018  Patient: Tyler Schmidt DOB: 1966/04/28 Physician: Narda Amber, DO  Sex: Male Height: 5\' 9"  Ref Phys: Marella Chimes, PA  ID#: [id number] Temp: 32.5C Technician:    Patient Complaints: This is a 52 year old man with rheumatoid arthritis referred for evaluation of bilateral leg weakness and fatigue.  NCV & EMG Findings: Electrodiagnostic testing of the right lower extremity and additional studies of the left shows: 1. Bilateral sural and superficial peroneal sensory responses are within normal limits. 2. Bilateral peroneal and tibial motor responses are within normal limits. 3. Bilateral tibial H reflex studies are within normal limits. 4. There is no evidence of active or chronic motor axonal changes affecting any of the tested muscles.  Motor unit configuration and recruitment pattern is within normal limits.   Impression: This is a normal study of the lower extremities.  In particular, there is no evidence of a sensorimotor polyneuropathy or lumbosacral radiculopathy.   ___________________________ Narda Amber, DO    Nerve Conduction Studies Anti Sensory Summary Table   Site NR Peak (ms) Norm Peak (ms) P-T Amp (V) Norm P-T Amp  Left Sup Peroneal Anti Sensory (Ant Lat Mall)  32.5C  12 cm    3.6 <4.6 15.0 >4  Right Sup Peroneal Anti Sensory (Ant Lat Mall)  32.5C  12 cm    3.1 <4.6 18.3 >4  Left Sural Anti Sensory (Lat Mall)  32.5C  Calf    3.8 <4.6 30.2 >4  Right Sural Anti Sensory (Lat Mall)  32.5C  Calf    3.7 <4.6 28.6 >4   Motor Summary Table   Site NR Onset (ms) Norm Onset (ms) O-P Amp (mV) Norm O-P Amp Site1 Site2 Delta-0 (ms) Dist (cm) Vel (m/s) Norm Vel (m/s)  Left Peroneal Motor (Ext Dig Brev)  32.5C  Ankle    6.0 <6.0 2.6 >2.5 B Fib Ankle 9.1 38.0 42 >40  B Fib    16.3  2.2  Poplt B Fib 2.0 8.0 40 >40  Poplt     18.3  2.1         Right Peroneal Motor (Ext Dig Brev)  32.5C  Ankle    5.9 <6.0 3.3 >2.5 B Fib Ankle 8.1 38.0 47 >40  B Fib    14.0  3.2  Poplt B Fib 1.5 8.0 53 >40  Poplt    15.5  3.1         Left Peroneal TA Motor (Tib Ant)  32.5C  Fib Head    3.6 <4.5 4.0 >3 Poplit Fib Head 1.2 8.0 67 >40  Poplit    4.8  4.0         Right Peroneal TA Motor (Tib Ant)  32.5C  Fib Head    2.7 <4.5 4.4 >3 Poplit Fib Head 1.4 8.0 57 >40  Poplit    4.1  4.2         Left Tibial Motor (Abd Hall Brev)  32.5C  Ankle    5.2 <6.0 13.5 >4 Knee Ankle 8.8 42.0 48 >40  Knee    14.0  10.1         Right Tibial Motor (Abd Hall Brev)  32.5C  Ankle    4.8 <6.0 13.6 >4 Knee Ankle 9.8 42.0 43 >40  Knee    14.6  9.8          H Reflex Studies  NR H-Lat (ms) Lat Norm (ms) L-R H-Lat (ms)  Left Tibial (Gastroc)  32.5C     34.97 <35 2.86  Right Tibial (Gastroc)  32.5C     32.11 <35 2.86   EMG   Side Muscle Ins Act Fibs Psw Fasc Number Recrt Dur Dur. Amp Amp. Poly Poly. Comment  Right AntTibialis Nml Nml Nml Nml Nml Nml Nml Nml Nml Nml Nml Nml N/A  Right Gastroc Nml Nml Nml Nml Nml Nml Nml Nml Nml Nml Nml Nml N/A  Right Flex Dig Long Nml Nml Nml Nml Nml Nml Nml Nml Nml Nml Nml Nml N/A  Right RectFemoris Nml Nml Nml Nml Nml Nml Nml Nml Nml Nml Nml Nml N/A  Right GluteusMed Nml Nml Nml Nml Nml Nml Nml Nml Nml Nml Nml Nml N/A  Right Lumbo Parasp Low Nml Nml Nml Nml Nml Nml Nml Nml Nml Nml Nml Nml N/A  Left AntTibialis Nml Nml Nml Nml Nml Nml Nml Nml Nml Nml Nml Nml N/A  Left Gastroc Nml Nml Nml Nml Nml Nml Nml Nml Nml Nml Nml Nml N/A  Left Flex Dig Long Nml Nml Nml Nml Nml Nml Nml Nml Nml Nml Nml Nml N/A  Left RectFemoris Nml Nml Nml Nml Nml Nml Nml Nml Nml Nml Nml Nml N/A  Left GluteusMed Nml Nml Nml Nml Nml Nml Nml Nml Nml Nml Nml Nml N/A  Left BicepsFemS Nml Nml Nml Nml Nml Nml Nml Nml Nml Nml Nml Nml N/A      Waveforms:

## 2018-07-22 DIAGNOSIS — R29898 Other symptoms and signs involving the musculoskeletal system: Secondary | ICD-10-CM | POA: Diagnosis not present

## 2018-07-22 DIAGNOSIS — R768 Other specified abnormal immunological findings in serum: Secondary | ICD-10-CM | POA: Diagnosis not present

## 2018-07-22 DIAGNOSIS — M2550 Pain in unspecified joint: Secondary | ICD-10-CM | POA: Diagnosis not present

## 2018-07-25 DIAGNOSIS — R748 Abnormal levels of other serum enzymes: Secondary | ICD-10-CM | POA: Diagnosis not present

## 2018-07-25 DIAGNOSIS — R945 Abnormal results of liver function studies: Secondary | ICD-10-CM | POA: Diagnosis not present

## 2018-07-25 DIAGNOSIS — R05 Cough: Secondary | ICD-10-CM | POA: Diagnosis not present

## 2018-07-25 DIAGNOSIS — R768 Other specified abnormal immunological findings in serum: Secondary | ICD-10-CM | POA: Diagnosis not present

## 2018-07-25 DIAGNOSIS — R29898 Other symptoms and signs involving the musculoskeletal system: Secondary | ICD-10-CM | POA: Diagnosis not present

## 2018-07-28 ENCOUNTER — Other Ambulatory Visit (INDEPENDENT_AMBULATORY_CARE_PROVIDER_SITE_OTHER): Payer: BLUE CROSS/BLUE SHIELD

## 2018-07-28 ENCOUNTER — Other Ambulatory Visit: Payer: Self-pay | Admitting: Physician Assistant

## 2018-07-28 ENCOUNTER — Telehealth: Payer: Self-pay | Admitting: Family Medicine

## 2018-07-28 DIAGNOSIS — M791 Myalgia, unspecified site: Secondary | ICD-10-CM

## 2018-07-28 DIAGNOSIS — R634 Abnormal weight loss: Secondary | ICD-10-CM

## 2018-07-28 DIAGNOSIS — R945 Abnormal results of liver function studies: Principal | ICD-10-CM

## 2018-07-28 DIAGNOSIS — R509 Fever, unspecified: Secondary | ICD-10-CM

## 2018-07-28 DIAGNOSIS — R7989 Other specified abnormal findings of blood chemistry: Secondary | ICD-10-CM

## 2018-07-28 NOTE — Telephone Encounter (Signed)
Copied from [place] [id number]. Topic: Quick Communication - See Telephone Encounter >> Jul 28, 2018 10:53 AM Sheran Luz wrote: CRM for notification. See Telephone encounter for: 07/28/18.  Pts wife, Anderson Malta, called stating that pt has had no relief even after being referred to and seeing rheumatologist for 1 mo and having multiple lab appointments. She states that pt has had a temperature every day ranging from 99-100.4 and severe fatigue. Pts wife is requesting a call back from Dr. Charlett Blake or her RN to advise them on which direction pt needs to go, stating that they have considered the hospital.

## 2018-07-28 NOTE — Telephone Encounter (Signed)
Author phoned pt. to follow-up on sx. Pt. stated fatigue and fever started in July, diagnosied with RA by rheumatologist. Pt. Was prescribed methotrexate, but stopped d/t vertigo. Pt. Then started on prednisone, but shortly thereafter he developed low grade fever and lower body fatigue that spread to rest of body which has persisted for one month. Pt. states his jaw has especially been weak and painful, causing him to lose weight because it is painful to eat. Pt. States it is difficult to get follow-up rheumatology appointments, and really is seeking Dr. Frederik Pear advice. Pt. Currently not taking anything for RA. Author noticed Dr. Charlett Blake ordered more labwork on 9/20, and asked pt. ot come in to get labs drawn prior to 9/30 appointment with Dr. Charlett Blake, and pt. agreed. Pt. Stated he would stop by today to get labs drawn. Records from [facility] rheumatology requested in the meantime. Routed to Dr. Charlett Blake as Juluis Rainier.

## 2018-07-28 NOTE — Telephone Encounter (Signed)
If his jaw continues to be the focus of his worst pain we may need to consider doing imaging of this. Since he has an appointment in a couple of days can discuss and order then. We can also try a different rheumatologist if he would like but they are all over booked. definitely get the lab work done because it will give me more clues as to the cause (s) of his symptoms

## 2018-07-29 ENCOUNTER — Observation Stay (HOSPITAL_COMMUNITY)
Admission: EM | Admit: 2018-07-29 | Discharge: 2018-08-01 | Disposition: A | Discharge status: Home / Self Care | Payer: BLUE CROSS/BLUE SHIELD | Attending: Internal Medicine | Admitting: Internal Medicine

## 2018-07-29 ENCOUNTER — Ambulatory Visit
Admission: RE | Admit: 2018-07-29 | Discharge: 2018-07-29 | Disposition: A | Discharge status: Home / Self Care | Payer: BLUE CROSS/BLUE SHIELD | Source: Ambulatory Visit | Attending: Physician Assistant | Admitting: Physician Assistant

## 2018-07-29 ENCOUNTER — Other Ambulatory Visit: Payer: Self-pay

## 2018-07-29 ENCOUNTER — Encounter (HOSPITAL_COMMUNITY): Payer: Self-pay | Admitting: Emergency Medicine

## 2018-07-29 DIAGNOSIS — N200 Calculus of kidney: Secondary | ICD-10-CM | POA: Diagnosis not present

## 2018-07-29 DIAGNOSIS — Z8249 Family history of ischemic heart disease and other diseases of the circulatory system: Secondary | ICD-10-CM | POA: Diagnosis not present

## 2018-07-29 DIAGNOSIS — M353 Polymyalgia rheumatica: Secondary | ICD-10-CM | POA: Insufficient documentation

## 2018-07-29 DIAGNOSIS — R6510 Systemic inflammatory response syndrome (SIRS) of non-infectious origin without acute organ dysfunction: Secondary | ICD-10-CM | POA: Diagnosis not present

## 2018-07-29 DIAGNOSIS — K429 Umbilical hernia without obstruction or gangrene: Secondary | ICD-10-CM | POA: Diagnosis not present

## 2018-07-29 DIAGNOSIS — K219 Gastro-esophageal reflux disease without esophagitis: Secondary | ICD-10-CM | POA: Diagnosis not present

## 2018-07-29 DIAGNOSIS — I739 Peripheral vascular disease, unspecified: Secondary | ICD-10-CM | POA: Diagnosis not present

## 2018-07-29 DIAGNOSIS — M25561 Pain in right knee: Secondary | ICD-10-CM

## 2018-07-29 DIAGNOSIS — M2550 Pain in unspecified joint: Secondary | ICD-10-CM | POA: Diagnosis not present

## 2018-07-29 DIAGNOSIS — R945 Abnormal results of liver function studies: Principal | ICD-10-CM

## 2018-07-29 DIAGNOSIS — D473 Essential (hemorrhagic) thrombocythemia: Secondary | ICD-10-CM | POA: Insufficient documentation

## 2018-07-29 DIAGNOSIS — M25562 Pain in left knee: Secondary | ICD-10-CM

## 2018-07-29 DIAGNOSIS — M25552 Pain in left hip: Secondary | ICD-10-CM

## 2018-07-29 DIAGNOSIS — F419 Anxiety disorder, unspecified: Secondary | ICD-10-CM | POA: Insufficient documentation

## 2018-07-29 DIAGNOSIS — R634 Abnormal weight loss: Secondary | ICD-10-CM

## 2018-07-29 DIAGNOSIS — Z79899 Other long term (current) drug therapy: Secondary | ICD-10-CM | POA: Insufficient documentation

## 2018-07-29 DIAGNOSIS — K2270 Barrett's esophagus without dysplasia: Secondary | ICD-10-CM | POA: Diagnosis not present

## 2018-07-29 DIAGNOSIS — I2510 Atherosclerotic heart disease of native coronary artery without angina pectoris: Secondary | ICD-10-CM | POA: Diagnosis not present

## 2018-07-29 DIAGNOSIS — K4090 Unilateral inguinal hernia, without obstruction or gangrene, not specified as recurrent: Secondary | ICD-10-CM | POA: Diagnosis not present

## 2018-07-29 DIAGNOSIS — R000 Tachycardia, unspecified: Secondary | ICD-10-CM | POA: Diagnosis not present

## 2018-07-29 DIAGNOSIS — R7989 Other specified abnormal findings of blood chemistry: Secondary | ICD-10-CM

## 2018-07-29 DIAGNOSIS — Z87891 Personal history of nicotine dependence: Secondary | ICD-10-CM | POA: Insufficient documentation

## 2018-07-29 DIAGNOSIS — M069 Rheumatoid arthritis, unspecified: Secondary | ICD-10-CM | POA: Diagnosis not present

## 2018-07-29 DIAGNOSIS — E785 Hyperlipidemia, unspecified: Secondary | ICD-10-CM | POA: Diagnosis not present

## 2018-07-29 DIAGNOSIS — M25551 Pain in right hip: Secondary | ICD-10-CM | POA: Diagnosis not present

## 2018-07-29 DIAGNOSIS — D3502 Benign neoplasm of left adrenal gland: Secondary | ICD-10-CM | POA: Insufficient documentation

## 2018-07-29 DIAGNOSIS — N281 Cyst of kidney, acquired: Secondary | ICD-10-CM | POA: Insufficient documentation

## 2018-07-29 DIAGNOSIS — R509 Fever, unspecified: Secondary | ICD-10-CM | POA: Diagnosis present

## 2018-07-29 DIAGNOSIS — E039 Hypothyroidism, unspecified: Secondary | ICD-10-CM | POA: Diagnosis present

## 2018-07-29 DIAGNOSIS — R05 Cough: Secondary | ICD-10-CM | POA: Diagnosis not present

## 2018-07-29 DIAGNOSIS — G8929 Other chronic pain: Secondary | ICD-10-CM

## 2018-07-29 DIAGNOSIS — Z87442 Personal history of urinary calculi: Secondary | ICD-10-CM | POA: Diagnosis not present

## 2018-07-29 DIAGNOSIS — E871 Hypo-osmolality and hyponatremia: Secondary | ICD-10-CM | POA: Insufficient documentation

## 2018-07-29 DIAGNOSIS — M6281 Muscle weakness (generalized): Secondary | ICD-10-CM | POA: Diagnosis not present

## 2018-07-29 DIAGNOSIS — D8989 Other specified disorders involving the immune mechanism, not elsewhere classified: Secondary | ICD-10-CM | POA: Diagnosis not present

## 2018-07-29 DIAGNOSIS — Z888 Allergy status to other drugs, medicaments and biological substances status: Secondary | ICD-10-CM | POA: Insufficient documentation

## 2018-07-29 DIAGNOSIS — R5383 Other fatigue: Secondary | ICD-10-CM | POA: Diagnosis not present

## 2018-07-29 DIAGNOSIS — R740 Nonspecific elevation of levels of transaminase and lactic acid dehydrogenase [LDH]: Secondary | ICD-10-CM | POA: Diagnosis not present

## 2018-07-29 DIAGNOSIS — D75839 Thrombocytosis, unspecified: Secondary | ICD-10-CM | POA: Diagnosis present

## 2018-07-29 DIAGNOSIS — R7401 Elevation of levels of liver transaminase levels: Secondary | ICD-10-CM | POA: Diagnosis present

## 2018-07-29 DIAGNOSIS — N400 Enlarged prostate without lower urinary tract symptoms: Secondary | ICD-10-CM | POA: Insufficient documentation

## 2018-07-29 DIAGNOSIS — E038 Other specified hypothyroidism: Secondary | ICD-10-CM | POA: Diagnosis present

## 2018-07-29 LAB — CBC WITH DIFFERENTIAL/PLATELET
BASOS ABS: 0.1 10*3/uL (ref 0.0–0.1)
BASOS ABS: 0.2 10*3/uL — AB (ref 0.0–0.1)
Basophils Relative: 0.3 % (ref 0.0–3.0)
Basophils Relative: 1 %
EOS ABS: 0.3 10*3/uL (ref 0.0–0.7)
Eosinophils Absolute: 0 10*3/uL (ref 0.0–0.7)
Eosinophils Relative: 1.5 % (ref 0.0–5.0)
Eosinophils Relative: 0 %
HCT: 35.8 % — ABNORMAL LOW (ref 39.0–52.0)
HEMATOCRIT: 38 % — AB (ref 39.0–52.0)
HEMOGLOBIN: 11.9 g/dL — AB (ref 13.0–17.0)
Hemoglobin: 11.6 g/dL — ABNORMAL LOW (ref 13.0–17.0)
LYMPHS PCT: 7 %
Lymphocytes Relative: 6.3 % — ABNORMAL LOW (ref 12.0–46.0)
Lymphs Abs: 1.1 10*3/uL (ref 0.7–4.0)
Lymphs Abs: 1.6 10*3/uL (ref 0.7–4.0)
MCH: 27.4 pg (ref 26.0–34.0)
MCHC: 32.4 g/dL (ref 30.0–36.0)
MCHC: 31.3 g/dL (ref 30.0–36.0)
MCV: 85.1 fl (ref 78.0–100.0)
MCV: 87.6 fL (ref 78.0–100.0)
MONOS PCT: 4.8 % (ref 3.0–12.0)
MONOS PCT: 3 %
Monocytes Absolute: 0.8 10*3/uL (ref 0.1–1.0)
Monocytes Absolute: 0.7 10*3/uL (ref 0.1–1.0)
NEUTROS ABS: 15.3 10*3/uL — AB (ref 1.4–7.7)
Neutro Abs: 20.2 10*3/uL — ABNORMAL HIGH (ref 1.7–7.7)
Neutrophils Relative %: 89 %
PLATELETS: 691 10*3/uL — AB (ref 150.0–400.0)
Platelets: 746 10*3/uL — ABNORMAL HIGH (ref 150–400)
RBC: 4.2 Mil/uL — ABNORMAL LOW (ref 4.22–5.81)
RBC: 4.34 MIL/uL (ref 4.22–5.81)
RDW: 16.3 % — ABNORMAL HIGH (ref 11.5–15.5)
RDW: 14.9 % (ref 11.5–15.5)
WBC: 17.5 10*3/uL — ABNORMAL HIGH (ref 4.0–10.5)
WBC: 22.7 10*3/uL — AB (ref 4.0–10.5)

## 2018-07-29 LAB — COMPREHENSIVE METABOLIC PANEL
ALBUMIN: 2.7 g/dL — AB (ref 3.5–5.2)
ALK PHOS: 266 U/L — AB (ref 39–117)
ALK PHOS: 237 U/L — AB (ref 38–126)
ALT: 231 U/L — ABNORMAL HIGH (ref 0–53)
ALT: 354 U/L — ABNORMAL HIGH (ref 0–44)
ANION GAP: 15 (ref 5–15)
AST: 227 U/L — ABNORMAL HIGH (ref 0–37)
AST: 224 U/L — ABNORMAL HIGH (ref 15–41)
Albumin: 2.2 g/dL — ABNORMAL LOW (ref 3.5–5.0)
BILIRUBIN TOTAL: 0.7 mg/dL (ref 0.3–1.2)
BUN: 21 mg/dL (ref 6–23)
BUN: 16 mg/dL (ref 6–20)
CALCIUM: 8.5 mg/dL — AB (ref 8.9–10.3)
CO2: 29 mEq/L (ref 19–32)
CO2: 21 mmol/L — ABNORMAL LOW (ref 22–32)
Calcium: 8.6 mg/dL (ref 8.4–10.5)
Chloride: 97 mEq/L (ref 96–112)
Chloride: 96 mmol/L — ABNORMAL LOW (ref 98–111)
Creatinine, Ser: 0.78 mg/dL (ref 0.40–1.50)
Creatinine, Ser: 0.7 mg/dL (ref 0.61–1.24)
GFR calc Af Amer: >60 mL/min (ref >60)
GFR: 111.74 mL/min (ref >60.00)
Glucose, Bld: 94 mg/dL (ref 70–99)
Glucose, Bld: 117 mg/dL — ABNORMAL HIGH (ref 70–99)
POTASSIUM: 4.8 meq/L (ref 3.5–5.1)
POTASSIUM: 4.3 mmol/L (ref 3.5–5.1)
Sodium: 136 mEq/L (ref 135–145)
Sodium: 132 mmol/L — ABNORMAL LOW (ref 135–145)
TOTAL PROTEIN: 6 g/dL (ref 6.0–8.3)
TOTAL PROTEIN: 6.8 g/dL (ref 6.5–8.1)
Total Bilirubin: 0.3 mg/dL (ref 0.2–1.2)

## 2018-07-29 LAB — CBC (INCLUDES DIFF/PLT) WITH PATHOLOGIST REVIEW
Basophils Absolute: 137 cells/uL (ref 0–200)
Basophils Relative: 0.8 %
Eosinophils Absolute: 239 cells/uL (ref 15–500)
Eosinophils Relative: 1.4 %
HEMATOCRIT: 34.4 % — AB (ref 38.5–50.0)
Hemoglobin: 11.6 g/dL — ABNORMAL LOW (ref 13.2–17.1)
Lymphs Abs: 1231 cells/uL (ref 850–3900)
MCH: 27.6 pg (ref 27.0–33.0)
MCHC: 33.7 g/dL (ref 32.0–36.0)
MCV: 81.7 fL (ref 80.0–100.0)
MPV: 9.3 fL (ref 7.5–12.5)
Monocytes Relative: 5.8 %
NEUTROS PCT: 84.8 %
Neutro Abs: 14501 cells/uL — ABNORMAL HIGH (ref 1500–7800)
Platelets: 684 10*3/uL — ABNORMAL HIGH (ref 140–400)
RBC: 4.21 10*6/uL (ref 4.20–5.80)
RDW: 14.1 % (ref 11.0–15.0)
TOTAL LYMPHOCYTE: 7.2 %
WBC: 17.1 10*3/uL — AB (ref 3.8–10.8)
WBCMIX: 992 {cells}/uL — AB (ref 200–950)

## 2018-07-29 LAB — I-STAT CG4 LACTIC ACID, ED: LACTIC ACID, VENOUS: 1.3 mmol/L (ref 0.5–1.9)

## 2018-07-29 LAB — URINALYSIS, ROUTINE W REFLEX MICROSCOPIC
BILIRUBIN URINE: NEGATIVE
Glucose, UA: NEGATIVE mg/dL
Hgb urine dipstick: NEGATIVE
KETONES UR: NEGATIVE mg/dL
LEUKOCYTES UA: NEGATIVE
Nitrite: NEGATIVE
PH: 6 (ref 5.0–8.0)
PROTEIN: NEGATIVE mg/dL
Specific Gravity, Urine: 1.017 (ref 1.005–1.030)

## 2018-07-29 LAB — CK: Total CK: 10 U/L — ABNORMAL LOW (ref 49–397)

## 2018-07-29 LAB — ACETAMINOPHEN LEVEL: Acetaminophen (Tylenol), Serum: <10 ug/mL — ABNORMAL LOW (ref 10–30)

## 2018-07-29 LAB — SEDIMENTATION RATE
SED RATE: 80 mm/h — AB (ref 0–16)
Sed Rate: 33 mm/hr — ABNORMAL HIGH (ref 0–20)

## 2018-07-29 LAB — C-REACTIVE PROTEIN: CRP: 9.4 mg/dL — ABNORMAL HIGH (ref <1.0)

## 2018-07-29 MED ORDER — IOPAMIDOL (ISOVUE-300) INJECTION 61%
100.0000 mL | Freq: Once | INTRAVENOUS | Status: AC | PRN
  Administered 2018-07-29: 100 mL via INTRAVENOUS

## 2018-07-29 MED ORDER — SODIUM CHLORIDE 0.9 % IV BOLUS
1000.0000 mL | Freq: Once | INTRAVENOUS | Status: AC
  Administered 2018-07-29: 1000 mL via INTRAVENOUS

## 2018-07-29 NOTE — ED Notes (Signed)
ED Provider at bedside. 

## 2018-07-29 NOTE — ED Provider Notes (Signed)
Patient placed in Quick Look pathway, seen and evaluated   Chief Complaint: joint pain  HPI:  Tyler Schmidt is a 52 y.o. male who present to the ED from Dr. Azalee Course office in [facility]. Patient reports beings diagnosed with Rheumatoid Arthritis about a month ago. Pt reports having low-grade fever and weakness to legs x 1 month. Pt reports highest temp was 100.4 yesterday. Pt denies nausea, diarrhea, and vomiting. Pt reports having an abdominal scan today due to fever. Pt reports having tick bite in April. Pt has been being seen by rheumatologist and having blood work done, sent here due to elevated WBCs and LFT's  ROS: General: joint pain, weakness  Physical Exam:  BP (!) 131/96 (BP Location: Left Arm)   Pulse (!) 120   Temp 98.5 F (36.9 C) (Oral)   Resp 18   Ht 5\' 11"  (1.803 m)   Wt 70.3 kg   SpO2 98%   BMI 21.62 kg/m    Gen: No distress  Neuro: Awake and Alert  Skin: Warm and dry  Heart: tachycardia  Initiation of care has begun. The patient has been counseled on the process, plan, and necessity for staying for the completion/evaluation, and the remainder of the medical screening examination    Ashley Murrain, NP 07/29/18 Zinc, Ankit, MD 07/29/18 1840

## 2018-07-29 NOTE — ED Provider Notes (Signed)
[facility] Provider Note   CSN: [id number] Arrival date & time: 07/29/18  1644     History   Chief Complaint Chief Complaint  Patient presents with  . Abnormal Lab    HPI Franklin Baumbach is a 52 y.o. male.  HPI  Bit by tick in April, later developed arthralgias, shoulders, knees Was diagnosed with RA, started on methotrexate, felt dizzy, stopped it, continued prednisone   Around August 24th, began running low grade fever, muscle fatigue in legs, and jaw pain, followed up with [facility], completed blood work, nerve conduction test, taken off prednisone then started again on Monday, steroid shot.  New rheumatologist appt is on Monday Yesterday went to Dr. Randel Pigg for more blood work  Was taking tylenol extra strength one tablet ever 4 hours, stopped taking it since Monday. Fever comes and goes every 12 hours. Lost weight Monday 360 AST   Leg fatigue, jaw fatigue, 100.4, low grade fevers since August 24th,  Dry cough, haas had two negative cxr No other infectious symptoms   Past Medical History:  Diagnosis Date  . Adrenal adenoma, left    Incidentaloma noted on noncontrast chest CT done to f/u lung nodule seen on chest radiograph (10/2016).  . Allergy   . Anxiety 08/12/2012  . Atypical chest pain Fall 2013   Stress echo NORMAL 08/30/12  . Barrett esophagus 2012  . GERD (gastroesophageal reflux disease)   . Hyperlipidemia    HDL high, LDL high: simvastatin resulted in prolonged/severe muscle pain, led to long w/u and pt wants to avoid further statin unless lipids severely worsen  . Neck pain 08/12/2012  . Nephrolithiasis 10/2016   Bilat, nonobstruct, noted on CT chest done for f/u of pulm nodule seen on chest radiograph  . Palpitations Fall/winter 2017   Improved with lopressor  . Subclinical hypothyroidism    Patient give hx of hypfunctioning goiter in the distant past, says he was given pills to "kill" the  goiter.  Says bx of goiter was benign.    Patient Active Problem List   Diagnosis Date Noted  . Thrombocytosis ([place]) 07/30/2018  . Elevated transaminase level 07/30/2018  . SIRS (systemic inflammatory response syndrome) ([place]) 07/29/2018  . Polymyalgia ([place]) 07/29/2018  . Rheumatoid arthritis ([place]) 07/29/2018  . Fever 07/04/2018  . Abnormal TSH 05/08/2018  . ANA positive 05/08/2018  . Polyarthralgia 04/13/2018  . Skin lesion of left ear 12/21/2016  . Coronary artery calcification seen on CT scan 12/21/2016  . Other fatigue 10/04/2014  . Hyperlipidemia, mild 11/30/2013  . GERD (gastroesophageal reflux disease) 11/30/2013  . Neck pain 08/12/2012  . Anxiety 08/12/2012  . Reflux 08/12/2012  . Atypical chest pain 07/20/2012  . Subclinical hypothyroidism     Past Surgical History:  Procedure Laterality Date  . 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 Doreatha Lew)  . CARDIOVASCULAR STRESS TEST  08/2012   Stress echo normal.  Pt also reports normal ETT 09/2016.  Marland Kitchen ESOPHAGOGASTRODUODENOSCOPY  09/2011   Dr. Olevia Perches  . HOLTER MONITOR  10/2016   No pathologic arrhythmias.  Occ PVCs that occ corresponded to his palpitations.          Home Medications    Prior to Admission medications   Medication Sig Start Date End Date Taking? Authorizing Provider  b complex vitamins capsule Take 1 capsule by mouth daily.   Yes [provider]  ibuprofen (ADVIL,MOTRIN) 200 MG tablet Take  400-600 mg by mouth every 6 (six) hours as needed for fever.   Yes [provider]  levothyroxine (SYNTHROID, LEVOTHROID) 25 MCG tablet Take 1 tablet (25 mcg total) by mouth daily before breakfast. 05/03/18  Yes Mosie Lukes, MD  metoprolol tartrate (LOPRESSOR) 25 MG tablet TAKE 1 TABLET BY MOUTH TWICE A DAY 02/15/18  Yes Mosie Lukes, MD  omega-3 acid ethyl esters (LOVAZA) 1 g capsule Take 1 g by mouth daily.   Yes [provider]  predniSONE  (DELTASONE) 20 MG tablet 20 mg po bid x 7 days then 20 mg in am and 10 mg in pm x 7 days then 10 mg bid x 7 days then 10 mg qd x 7 days Patient taking differently: Take 20 mg by mouth daily.  05/10/18  Yes Mosie Lukes, MD  rosuvastatin (CRESTOR) 10 MG tablet TAKE 1 TABLET (10 MG TOTAL) BY MOUTH DAILY. 12/17/17  Yes Mosie Lukes, MD  Ubiquinol (QUNOL COQ10/UBIQUINOL/MEGA) 100 MG CAPS Take 1 capsule by mouth daily.   Yes [provider]  fluticasone (FLONASE) 50 MCG/ACT nasal spray Place 2 sprays into both nostrils daily. Patient not taking: Reported on 07/29/2018 09/17/15   Howard Pouch A, DO  meloxicam (MOBIC) 7.5 MG tablet Take 1 tablet (7.5 mg total) by mouth 2 (two) times daily. Patient not taking: Reported on 07/29/2018 05/02/18   Mosie Lukes, MD  ranitidine (ZANTAC) 300 MG tablet Take 1 tablet (300 mg total) by mouth at bedtime. Patient not taking: Reported on 07/29/2018 05/22/18   Mosie Lukes, MD  Vitamin D, Ergocalciferol, (DRISDOL) 50000 units CAPS capsule Take 1 capsule (50,000 Units total) by mouth every 7 (seven) days. Patient not taking: Reported on 07/29/2018 04/13/18   Lyndal Pulley, DO    Family History Family History  Problem Relation Age of Onset  . Heart disease Father        Died of MI age 30, possible PE  . Heart disease Paternal Uncle        MI's in his 27s  . Heart disease Paternal Uncle   . Colon cancer Neg Hx     Social History Social History   Tobacco Use  . Smoking status: Former Smoker    Types: Cigarettes    Last attempt to quit: 08/18/1979    Years since quitting: 38.9  . Smokeless tobacco: Former Systems developer    Types: Chew  . Tobacco comment: has not used since age 7  Substance Use Topics  . Alcohol use: No    Alcohol/week: 0.0 standard drinks  . Drug use: No     Allergies   Simvastatin and Atorvastatin   Review of Systems Review of Systems  Constitutional: Positive for fatigue and fever.  HENT: Negative for sore throat.     Eyes: Negative for visual disturbance.  Respiratory: Positive for cough. Negative for shortness of breath.   Cardiovascular: Negative for chest pain.  Gastrointestinal: Negative for abdominal pain, diarrhea and vomiting.  Genitourinary: Negative for difficulty urinating.  Musculoskeletal: Positive for arthralgias and myalgias. Negative for back pain and neck stiffness.  Skin: Negative for rash.  Neurological: Negative for syncope and headaches.     Physical Exam Updated Vital Signs BP 130/87 (BP Location: Right Arm)   Pulse 95   Temp 97.9 F (36.6 C) (Oral)   Resp 19   Ht _0  (1.778 m)   Wt 71.2 kg   SpO2 96%   BMI 22.53 kg/m  Physical Exam  Constitutional: He is oriented to person, place, and time. He appears well-developed and well-nourished. No distress.  HENT:  Head: Normocephalic and atraumatic.  Eyes: Conjunctivae and EOM are normal.  Neck: Normal range of motion.  Cardiovascular: Normal rate, regular rhythm, normal heart sounds and intact distal pulses. Exam reveals no gallop and no friction rub.  No murmur heard. Pulmonary/Chest: Effort normal and breath sounds normal. No respiratory distress. He has no wheezes. He has no rales.  Abdominal: Soft. He exhibits no distension. There is no tenderness. There is no guarding.  Musculoskeletal: He exhibits no edema.  Neurological: He is alert and oriented to person, place, and time.  Skin: Skin is warm and dry. He is not diaphoretic.  Nursing note and vitals reviewed.    ED Treatments / Results  Labs (all labs ordered are listed, but only abnormal results are displayed) Labs Reviewed  CBC WITH DIFFERENTIAL/PLATELET - Abnormal; Notable for the following components:      Result Value   WBC 22.7 (*)    Hemoglobin 11.9 (*)    HCT 38.0 (*)    Platelets 746 (*)    Neutro Abs 20.2 (*)    Basophils Absolute 0.2 (*)    All other components within normal limits  COMPREHENSIVE METABOLIC PANEL - Abnormal; Notable for  the following components:   Sodium 132 (*)    Chloride 96 (*)    CO2 21 (*)    Glucose, Bld 117 (*)    Calcium 8.5 (*)    Albumin 2.2 (*)    AST 224 (*)    ALT 354 (*)    Alkaline Phosphatase 237 (*)    All other components within normal limits  URINALYSIS, ROUTINE W REFLEX MICROSCOPIC - Abnormal; Notable for the following components:   Color, Urine STRAW (*)    All other components within normal limits  ACETAMINOPHEN LEVEL - Abnormal; Notable for the following components:   Acetaminophen (Tylenol), Serum <10 (*)    All other components within normal limits  CK - Abnormal; Notable for the following components:   Total CK 10 (*)    All other components within normal limits  SEDIMENTATION RATE - Abnormal; Notable for the following components:   Sed Rate 80 (*)    All other components within normal limits  C-REACTIVE PROTEIN - Abnormal; Notable for the following components:   CRP 9.4 (*)    All other components within normal limits  CULTURE, BLOOD (ROUTINE X 2)  CULTURE, BLOOD (ROUTINE X 2)  URINE CULTURE  EHRLICHIA ANTIBODY PANEL  HIV ANTIBODY (ROUTINE TESTING W REFLEX)  HEPATITIS PANEL, ACUTE  COMPREHENSIVE METABOLIC PANEL  CBC WITH DIFFERENTIAL/PLATELET  I-STAT CG4 LACTIC ACID, ED    EKG None  Radiology Ct Abdomen Pelvis W Contrast  Result Date: 07/29/2018 CLINICAL DATA:  Elevated liver function tests and low-grade fever. Recently diagnosed with rheumatoid arthritis. Known left adrenal adenoma. EXAM: CT ABDOMEN AND PELVIS WITH CONTRAST TECHNIQUE: Multidetector CT imaging of the abdomen and pelvis was performed using the standard protocol following bolus administration of intravenous contrast. CONTRAST:  120m ISOVUE-300 IOPAMIDOL (ISOVUE-300) INJECTION 61% COMPARISON:  Chest CT dated 10/16/2016. FINDINGS: Lower chest: Interval mild linear atelectasis or scarring at both lung bases. Hepatobiliary: No focal liver abnormality is seen. No gallstones, gallbladder wall  thickening, or biliary dilatation. Pancreas: Unremarkable. No pancreatic ductal dilatation or surrounding inflammatory changes. Spleen: Normal in size without focal abnormality. Adrenals/Urinary Tract: The previously demonstrated 2.4 x 2.3 cm low density left  adrenal mass currently measures 2.6 x 2.5 cm. Normal appearing right adrenal gland. 9 mm mid right renal cyst and additional tiny right renal cysts. Tiny lower pole right renal calculus. Multiple small left renal calculi, the largest measuring 7 mm in maximum diameter, in the lower pole . No bladder or ureteral calculi seen. No hydronephrosis. Stomach/Bowel: Stomach is within normal limits. Appendix appears normal. No evidence of bowel wall thickening, distention, or inflammatory changes. Vascular/Lymphatic: No significant vascular findings are present. No enlarged abdominal or pelvic lymph nodes. Reproductive: Mildly enlarged prostate gland. Other: Small proximal left inguinal hernia containing fat. Small umbilical hernia containing fat. Musculoskeletal: Mild lumbar and lower thoracic spine degenerative changes. IMPRESSION: 1. No acute abnormality. 2. Stable left adrenal adenoma. 3. Small, nonobstructing bilateral renal calculi. 4. Mild prostatic hypertrophy. Electronically Signed   By: Claudie Revering M.D.   On: 07/29/2018 17:04    Procedures Procedures (including critical care time)  Medications Ordered in ED Medications  ibuprofen (ADVIL,MOTRIN) 100 MG/5ML suspension 400-600 mg (has no administration in time range)  metoprolol tartrate (LOPRESSOR) tablet 25 mg (has no administration in time range)  omega-3 acid ethyl esters (LOVAZA) capsule 1 g (has no administration in time range)  levothyroxine (SYNTHROID, LEVOTHROID) tablet 25 mcg (has no administration in time range)  predniSONE (DELTASONE) tablet 20 mg (has no administration in time range)  B-complex with vitamin C tablet 1 tablet (has no administration in time range)  enoxaparin (LOVENOX)  injection 40 mg (has no administration in time range)  senna-docusate (Senokot-S) tablet 1 tablet (has no administration in time range)  0.9 %  sodium chloride infusion ( Intravenous New Bag/Given 07/30/18 0139)  sodium chloride 0.9 % bolus 1,000 mL (0 mLs Intravenous Stopped 07/29/18 2208)     Initial Impression / Assessment and Plan / ED Course  I have reviewed the triage vital signs and the nursing notes.  Pertinent labs & imaging results that were available during my care of the patient were reviewed by me and considered in my medical decision making (see chart for details).     52yo male presents with concern for fever of 100.4 for one month, sensation of leg pain weakness and jaw claudication/weakness. Initially tachycardic on arrival to ED with rapid improvement. No signs of bacterial infection by history and exam. Reports having recent CXR without abnormality in setting of dry cough. Leukocytosis may be secondary to steroids or other. Transaminitis ?from underlying condition versus tylenol. No abdominal pain or tenderness, and normal CT completed as outpatient.   Pt with FUO, ddx includes rheumatologic etiologies, neoplastic, infectious.Many symptoms consistent with polymyalgia rheumatica and GCA. Given tick exposures, will send erlichia ab. Has already been tested for lyme/rmsf.  Sent ESR, CRP.  Will send blood cx however not initate abx at this time given no source for bacterial infection.  Discussed with Dr. Baxter Flattery of ID. Sent additional labs, and will resend blood cx and she will evaluate pt in the AM. Will admit to hospitalist service for further care.   Final diagnoses:  Fever of unknown origin  Polymyalgia ([place])  Chronic arthralgias of knees and hips    ED Discharge Orders    None       Gareth Morgan, MD 07/30/18 [phone]

## 2018-07-29 NOTE — ED Notes (Signed)
Opyd, MD at bedside 

## 2018-07-29 NOTE — ED Triage Notes (Signed)
Pt reports beings diagnosed with Rheumatoid Arthritis about a month ago. Pt reports having low-grade fever and weakness to legs x 1 month. Pt reports highest temp was 100.4 yesterday. Pt denies nausea, diarrhea, and vomiting. Pt reports having an abdominal scan today due to fever. Pt reports having tick bite in April. Pt has been being seen by rheumatologist and having blood work done, sent here due to elevated WBCs.

## 2018-07-30 ENCOUNTER — Encounter (HOSPITAL_COMMUNITY): Payer: Self-pay | Admitting: Family Medicine

## 2018-07-30 ENCOUNTER — Observation Stay (HOSPITAL_COMMUNITY): Payer: BLUE CROSS/BLUE SHIELD

## 2018-07-30 DIAGNOSIS — R05 Cough: Secondary | ICD-10-CM | POA: Diagnosis not present

## 2018-07-30 DIAGNOSIS — D473 Essential (hemorrhagic) thrombocythemia: Secondary | ICD-10-CM | POA: Diagnosis present

## 2018-07-30 DIAGNOSIS — D75839 Thrombocytosis, unspecified: Secondary | ICD-10-CM | POA: Diagnosis present

## 2018-07-30 DIAGNOSIS — R740 Nonspecific elevation of levels of transaminase and lactic acid dehydrogenase [LDH]: Secondary | ICD-10-CM

## 2018-07-30 DIAGNOSIS — D72829 Elevated white blood cell count, unspecified: Secondary | ICD-10-CM | POA: Diagnosis not present

## 2018-07-30 DIAGNOSIS — R6510 Systemic inflammatory response syndrome (SIRS) of non-infectious origin without acute organ dysfunction: Secondary | ICD-10-CM

## 2018-07-30 DIAGNOSIS — R7401 Elevation of levels of liver transaminase levels: Secondary | ICD-10-CM | POA: Diagnosis present

## 2018-07-30 LAB — COMPREHENSIVE METABOLIC PANEL
ALT: 221 U/L — AB (ref 0–44)
AST: 96 U/L — ABNORMAL HIGH (ref 15–41)
Albumin: 1.8 g/dL — ABNORMAL LOW (ref 3.5–5.0)
Alkaline Phosphatase: 188 U/L — ABNORMAL HIGH (ref 38–126)
Anion gap: 7 (ref 5–15)
BILIRUBIN TOTAL: 0.4 mg/dL (ref 0.3–1.2)
BUN: 13 mg/dL (ref 6–20)
CALCIUM: 7.7 mg/dL — AB (ref 8.9–10.3)
CO2: 24 mmol/L (ref 22–32)
Chloride: 103 mmol/L (ref 98–111)
Creatinine, Ser: 0.77 mg/dL (ref 0.61–1.24)
GFR calc non Af Amer: >60 mL/min (ref >60)
Glucose, Bld: 108 mg/dL — ABNORMAL HIGH (ref 70–99)
Potassium: 3.3 mmol/L — ABNORMAL LOW (ref 3.5–5.1)
Sodium: 134 mmol/L — ABNORMAL LOW (ref 135–145)
TOTAL PROTEIN: 5.1 g/dL — AB (ref 6.5–8.1)

## 2018-07-30 LAB — CBC WITH DIFFERENTIAL/PLATELET
Abs Immature Granulocytes: 0.9 10*3/uL — ABNORMAL HIGH (ref 0.0–0.1)
BASOS ABS: 0.1 10*3/uL (ref 0.0–0.1)
BASOS PCT: 1 %
EOS ABS: 0.5 10*3/uL (ref 0.0–0.7)
Eosinophils Relative: 3 %
HCT: 31.3 % — ABNORMAL LOW (ref 39.0–52.0)
Hemoglobin: 9.8 g/dL — ABNORMAL LOW (ref 13.0–17.0)
Immature Granulocytes: 5 %
Lymphocytes Relative: 9 %
Lymphs Abs: 1.7 10*3/uL (ref 0.7–4.0)
MCH: 27.7 pg (ref 26.0–34.0)
MCHC: 31.3 g/dL (ref 30.0–36.0)
MCV: 88.4 fL (ref 78.0–100.0)
Monocytes Absolute: 1.2 10*3/uL — ABNORMAL HIGH (ref 0.1–1.0)
Monocytes Relative: 7 %
Neutro Abs: 14.3 10*3/uL — ABNORMAL HIGH (ref 1.7–7.7)
Neutrophils Relative %: 75 %
PLATELETS: 566 10*3/uL — AB (ref 150–400)
RBC: 3.54 MIL/uL — AB (ref 4.22–5.81)
RDW: 15 % (ref 11.5–15.5)
WBC: 18.7 10*3/uL — AB (ref 4.0–10.5)

## 2018-07-30 LAB — HIV ANTIBODY (ROUTINE TESTING W REFLEX): HIV Screen 4th Generation wRfx: NONREACTIVE

## 2018-07-30 LAB — FERRITIN: Ferritin: 1444 ng/mL — ABNORMAL HIGH (ref 24–336)

## 2018-07-30 LAB — SAVE SMEAR

## 2018-07-30 MED ORDER — PANTOPRAZOLE SODIUM 40 MG PO TBEC
40.0000 mg | DELAYED_RELEASE_TABLET | Freq: Two times a day (BID) | ORAL | Status: DC
  Administered 2018-07-30 – 2018-08-01 (×4): 40 mg via ORAL
  Filled 2018-07-30 (×4): qty 1

## 2018-07-30 MED ORDER — SENNOSIDES-DOCUSATE SODIUM 8.6-50 MG PO TABS: 1.0000 | ORAL_TABLET | Freq: Every evening | ORAL | Status: DC | PRN

## 2018-07-30 MED ORDER — PREDNISONE 20 MG PO TABS
60.0000 mg | ORAL_TABLET | Freq: Once | ORAL | Status: AC
  Administered 2018-07-30: 60 mg via ORAL
  Filled 2018-07-30: qty 3

## 2018-07-30 MED ORDER — PREDNISONE 20 MG PO TABS
20.0000 mg | ORAL_TABLET | Freq: Every day | ORAL | Status: DC
  Administered 2018-07-30: 20 mg via ORAL
  Filled 2018-07-30: qty 1

## 2018-07-30 MED ORDER — IBUPROFEN 100 MG/5ML PO SUSP
400.0000 mg | Freq: Four times a day (QID) | ORAL | Status: DC | PRN
  Administered 2018-07-30: 600 mg via ORAL
  Filled 2018-07-30 (×2): qty 30

## 2018-07-30 MED ORDER — METOPROLOL TARTRATE 25 MG PO TABS
25.0000 mg | ORAL_TABLET | Freq: Two times a day (BID) | ORAL | Status: DC
  Administered 2018-07-30 – 2018-08-01 (×5): 25 mg via ORAL
  Filled 2018-07-30 (×5): qty 1

## 2018-07-30 MED ORDER — LEVOTHYROXINE SODIUM 25 MCG PO TABS
25.0000 ug | ORAL_TABLET | Freq: Every day | ORAL | Status: DC
  Administered 2018-07-30 – 2018-08-01 (×3): 25 ug via ORAL
  Filled 2018-07-30 (×4): qty 1

## 2018-07-30 MED ORDER — PREDNISONE 20 MG PO TABS
40.0000 mg | ORAL_TABLET | Freq: Two times a day (BID) | ORAL | Status: DC
  Administered 2018-07-31 – 2018-08-01 (×3): 40 mg via ORAL
  Filled 2018-07-30 (×3): qty 2

## 2018-07-30 MED ORDER — ENOXAPARIN SODIUM 40 MG/0.4ML ~~LOC~~ SOLN
40.0000 mg | SUBCUTANEOUS | Status: DC
  Administered 2018-07-30 – 2018-07-31 (×2): 40 mg via SUBCUTANEOUS
  Filled 2018-07-30 (×3): qty 0.4

## 2018-07-30 MED ORDER — OMEGA-3-ACID ETHYL ESTERS 1 G PO CAPS
1.0000 g | ORAL_CAPSULE | Freq: Every day | ORAL | Status: DC
  Administered 2018-07-30 – 2018-08-01 (×3): 1 g via ORAL
  Filled 2018-07-30 (×3): qty 1

## 2018-07-30 MED ORDER — SODIUM CHLORIDE 0.9 % IV SOLN
INTRAVENOUS | Status: AC
  Administered 2018-07-30: 02:00:00 via INTRAVENOUS

## 2018-07-30 MED ORDER — B COMPLEX-C PO TABS
1.0000 | ORAL_TABLET | Freq: Every day | ORAL | Status: DC
  Administered 2018-07-30 – 2018-08-01 (×3): 1 via ORAL
  Filled 2018-07-30 (×3): qty 1

## 2018-07-30 NOTE — H&P (Signed)
History and Physical    Tyler Schmidt VEL:[id number] DOB: Aug 11, 1966 DOA: 07/29/2018  PCP: Mosie Lukes, MD   Patient coming from: Home   Chief Complaint: Fevers, malaise   HPI: Tyler Schmidt is a 52 y.o. male with medical history significant for hypothyroidism, coronary calcium on CT, and recently diagnosed rheumatoid arthritis, presenting to the emergency department for evaluation of recurrent fevers, chills, and malaise.  Patient reports that symptoms developed 2 to 3 months ago, had an infectious work-up that was negative, had serologies consistent with RA, diagnosis was confirmed by rheumatologist, his condition improved drastically with prednisone, but when he was changed to methotrexate and unable to tolerate the medication, symptoms returned.  He is now back on prednisone 20 mg daily, but reports continued fevers intermittently, occurring once or twice daily, treated at home with Advil, and associated with general malaise and fatigue.  He had been experiencing diffuse arthralgias, but not currently.  Denies any significant shortness of breath, cough, abdominal pain, nausea, vomiting, diarrhea, or dysuria.  ED Course: Upon arrival to the ED, patient is found to be afebrile, saturating well on room air, and with vitals otherwise stable.  EKG features a sinus rhythm with nonspecific ST abnormality.  Chest x-ray is negative for acute cardiopulmonary disease.  CT abdomen and pelvis is negative for acute findings.  Chemistry panel is notable for mild hyponatremia and mild elevation in transaminases.  CBC features a leukocytosis to 22,700 and a thrombocytosis with platelets 746,000.  Blood cultures were collected and the patient was given a liter of normal saline in the ED.  Infectious disease was consulted by ED physician and recommended admission with repeat blood cultures in the morning.  Review of Systems:  All other systems reviewed and apart from HPI, are negative.  Past Medical  History:  Diagnosis Date  . Adrenal adenoma, left    Incidentaloma noted on noncontrast chest CT done to f/u lung nodule seen on chest radiograph (10/2016).  . Allergy   . Anxiety 08/12/2012  . Atypical chest pain Fall 2013   Stress echo NORMAL 08/30/12  . Barrett esophagus 2012  . GERD (gastroesophageal reflux disease)   . Hyperlipidemia    HDL high, LDL high: simvastatin resulted in prolonged/severe muscle pain, led to long w/u and pt wants to avoid further statin unless lipids severely worsen  . Neck pain 08/12/2012  . Nephrolithiasis 10/2016   Bilat, nonobstruct, noted on CT chest done for f/u of pulm nodule seen on chest radiograph  . Palpitations Fall/winter 2017   Improved with lopressor  . Subclinical hypothyroidism    Patient give hx of hypfunctioning goiter in the distant past, says he was given pills to "kill" the goiter.  Says bx of goiter was benign.    Past Surgical History:  Procedure Laterality Date  . 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 CARDIOVASCULAR STRESS TEST  08/2012   Stress echo normal.  Pt also reports normal ETT 09/2016.  Marland Kitchen ESOPHAGOGASTRODUODENOSCOPY  09/2011   Dr. Olevia Perches  . HOLTER MONITOR  10/2016   No pathologic arrhythmias.  Occ PVCs that occ corresponded to his palpitations.       reports that he quit smoking about 38 years ago. His smoking use included cigarettes. He has quit using smokeless tobacco.  His smokeless tobacco use included chew. He reports that he does not drink alcohol or use drugs.  Allergies  Allergen Reactions  .  Simvastatin Other (See Comments)    myalgias  . Atorvastatin Other (See Comments)    Joint pain and low back pain    Family History  Problem Relation Age of Onset  . Heart disease Father        Died of MI age 82, possible PE  . Heart disease Paternal Uncle        MI's in his 45s  . Heart disease Paternal Uncle   . Colon cancer Neg Hx      Prior to  Admission medications   Medication Sig Start Date End Date Taking? Authorizing Provider  b complex vitamins capsule Take 1 capsule by mouth daily.   Yes [provider]  ibuprofen (ADVIL,MOTRIN) 200 MG tablet Take 400-600 mg by mouth every 6 (six) hours as needed for fever.   Yes [provider]  levothyroxine (SYNTHROID, LEVOTHROID) 25 MCG tablet Take 1 tablet (25 mcg total) by mouth daily before breakfast. 05/03/18  Yes Mosie Lukes, MD  metoprolol tartrate (LOPRESSOR) 25 MG tablet TAKE 1 TABLET BY MOUTH TWICE A DAY 02/15/18  Yes Mosie Lukes, MD  omega-3 acid ethyl esters (LOVAZA) 1 g capsule Take 1 g by mouth daily.   Yes [provider]  predniSONE (DELTASONE) 20 MG tablet 20 mg po bid x 7 days then 20 mg in am and 10 mg in pm x 7 days then 10 mg bid x 7 days then 10 mg qd x 7 days Patient taking differently: Take 20 mg by mouth daily.  05/10/18  Yes Mosie Lukes, MD  rosuvastatin (CRESTOR) 10 MG tablet TAKE 1 TABLET (10 MG TOTAL) BY MOUTH DAILY. 12/17/17  Yes Mosie Lukes, MD  Ubiquinol (QUNOL COQ10/UBIQUINOL/MEGA) 100 MG CAPS Take 1 capsule by mouth daily.   Yes [provider]  fluticasone (FLONASE) 50 MCG/ACT nasal spray Place 2 sprays into both nostrils daily. Patient not taking: Reported on 07/29/2018 09/17/15   Howard Pouch A, DO  meloxicam (MOBIC) 7.5 MG tablet Take 1 tablet (7.5 mg total) by mouth 2 (two) times daily. Patient not taking: Reported on 07/29/2018 05/02/18   Mosie Lukes, MD  ranitidine (ZANTAC) 300 MG tablet Take 1 tablet (300 mg total) by mouth at bedtime. Patient not taking: Reported on 07/29/2018 05/22/18   Mosie Lukes, MD  Vitamin D, Ergocalciferol, (DRISDOL) 50000 units CAPS capsule Take 1 capsule (50,000 Units total) by mouth every 7 (seven) days. Patient not taking: Reported on 07/29/2018 04/13/18   Lyndal Pulley, DO    Physical Exam: Vitals:   07/29/18 2145 07/29/18 2215 07/29/18 2245 07/29/18 2329  BP: 130/88    130/87  Pulse: 92 97 94 95  Resp:  (!) 21 17 19   Temp:    97.9 F (36.6 C)  TempSrc:    Oral  SpO2: 97% 100% 99% 96%  Weight:    71.2 kg  Height:    5\' 10"  (1.778 m)     Constitutional: NAD, calm  Eyes: PERTLA, lids and conjunctivae normal ENMT: Mucous membranes are moist. Posterior pharynx clear of any exudate or lesions.   Neck: normal, supple, no masses, no thyromegaly Respiratory: clear to auscultation bilaterally, no wheezing, no crackles. Normal respiratory effort.    Cardiovascular: S1 & S2 heard, regular rate and rhythm. No extremity edema.  Abdomen: No distension, no tenderness, soft. Bowel sounds normal.  Musculoskeletal: no clubbing / cyanosis. No joint deformity upper and lower extremities.   Skin: no significant rashes, lesions, ulcers.  Warm, dry, well-perfused. Neurologic: CN 2-12 grossly intact. Sensation intact. Strength 5/5 in all 4 limbs.  Psychiatric: Alert and oriented x 3. Calm, cooperative.    Labs on Admission: I have personally reviewed following labs and imaging studies  CBC: Recent Labs  Lab 07/28/18 1450 07/29/18 1718  WBC 17.1*  17.5* 22.7*  NEUTROABS 14,501*  15.3* 20.2*  HGB 11.6*  11.6* 11.9*  HCT 34.4*  35.8* 38.0*  MCV 81.7  85.1 87.6  PLT 684*  691.0* 417*   Basic Metabolic Panel: Recent Labs  Lab 07/28/18 1450 07/29/18 1718  NA 136 132*  K 4.8 4.3  CL 97 96*  CO2 29 21*  GLUCOSE 94 117*  BUN 21 16  CREATININE 0.78 0.70  CALCIUM 8.6 8.5*   GFR: Estimated Creatinine Clearance: 111.3 mL/min (by C-G formula based on SCr of 0.7 mg/dL). Liver Function Tests: Recent Labs  Lab 07/28/18 1450 07/29/18 1718  AST 227* 224*  ALT 231* 354*  ALKPHOS 266* 237*  BILITOT 0.3 0.7  PROT 6.0 6.8  ALBUMIN 2.7* 2.2*   No results for input(s): LIPASE, AMYLASE in the last 168 hours. No results for input(s): AMMONIA in the last 168 hours. Coagulation Profile: No results for input(s): INR, PROTIME in the last 168 hours. Cardiac  Enzymes: Recent Labs  Lab 07/29/18 2138  CKTOTAL 10*   BNP (last 3 results) No results for input(s): PROBNP in the last 8760 hours. HbA1C: No results for input(s): HGBA1C in the last 72 hours. CBG: No results for input(s): GLUCAP in the last 168 hours. Lipid Profile: No results for input(s): CHOL, HDL, LDLCALC, TRIG, CHOLHDL, LDLDIRECT in the last 72 hours. Thyroid Function Tests: No results for input(s): TSH, T4TOTAL, FREET4, T3FREE, THYROIDAB in the last 72 hours. Anemia Panel: No results for input(s): VITAMINB12, FOLATE, FERRITIN, TIBC, IRON, RETICCTPCT in the last 72 hours. Urine analysis:    Component Value Date/Time   COLORURINE STRAW (A) 07/29/2018 2157   APPEARANCEUR CLEAR 07/29/2018 2157   LABSPEC 1.017 07/29/2018 2157   PHURINE 6.0 07/29/2018 2157   GLUCOSEU NEGATIVE 07/29/2018 2157   HGBUR NEGATIVE 07/29/2018 2157   BILIRUBINUR NEGATIVE 07/29/2018 2157   [place] NEGATIVE 07/29/2018 2157   PROTEINUR NEGATIVE 07/29/2018 2157   NITRITE NEGATIVE 07/29/2018 2157   LEUKOCYTESUR NEGATIVE 07/29/2018 2157   Sepsis Labs: @LABRCNTIP (procalcitonin:4,lacticidven:4) )No results found for this or any previous visit (from the past 240 hour(s)).   Radiological Exams on Admission: Ct Abdomen Pelvis W Contrast  Result Date: 07/29/2018 CLINICAL DATA:  Elevated liver function tests and low-grade fever. Recently diagnosed with rheumatoid arthritis. Known left adrenal adenoma. EXAM: CT ABDOMEN AND PELVIS WITH CONTRAST TECHNIQUE: Multidetector CT imaging of the abdomen and pelvis was performed using the standard protocol following bolus administration of intravenous contrast. CONTRAST:  177mL ISOVUE-300 IOPAMIDOL (ISOVUE-300) INJECTION 61% COMPARISON:  Chest CT dated 10/16/2016. FINDINGS: Lower chest: Interval mild linear atelectasis or scarring at both lung bases. Hepatobiliary: No focal liver abnormality is seen. No gallstones, gallbladder wall thickening, or biliary dilatation.  Pancreas: Unremarkable. No pancreatic ductal dilatation or surrounding inflammatory changes. Spleen: Normal in size without focal abnormality. Adrenals/Urinary Tract: The previously demonstrated 2.4 x 2.3 cm low density left adrenal mass currently measures 2.6 x 2.5 cm. Normal appearing right adrenal gland. 9 mm mid right renal cyst and additional tiny right renal cysts. Tiny lower pole right renal calculus. Multiple small left renal calculi, the largest measuring 7 mm in maximum diameter, in the lower pole . No bladder or  ureteral calculi seen. No hydronephrosis. Stomach/Bowel: Stomach is within normal limits. Appendix appears normal. No evidence of bowel wall thickening, distention, or inflammatory changes. Vascular/Lymphatic: No significant vascular findings are present. No enlarged abdominal or pelvic lymph nodes. Reproductive: Mildly enlarged prostate gland. Other: Small proximal left inguinal hernia containing fat. Small umbilical hernia containing fat. Musculoskeletal: Mild lumbar and lower thoracic spine degenerative changes. IMPRESSION: 1. No acute abnormality. 2. Stable left adrenal adenoma. 3. Small, nonobstructing bilateral renal calculi. 4. Mild prostatic hypertrophy. Electronically Signed   By: Claudie Revering M.D.   On: 07/29/2018 17:04    EKG: Independently reviewed. Sinus rhythm, non-specific ST abnormality.   Assessment/Plan   1. Fevers, SIRS  - Presents with complaints of intermittent fevers and general malaise and fatigue  - This has been going on for ~3 months, he was diagnosed with RA, improved on prednisone and was switched to methotrexate by rheumatology, but couldn't tolerate and symptoms returned  - CXR, CT abd/pelvis, and UA all without evidence for acute infection  - He had negative RSV, CMV, EBV IgM, RMSF IgG and IgM, Ehrlichia IgG and IgM, and negative B burgdorferi Ab  - Viral hepatitis panel and HIV were sent from the ED and remain pending - He has reassuring lactic acid,  normal BP, no fever in ED  - ID was consulted by ED physician and recommended admission and repeat blood cultures  - Repeat cultures, continue supportive care   2. Rheumatoid arthritis  - Patient developed fevers, polyarthralgia, and fatigue in ~3 months ago, had workup suggestive of RA and experienced marked improvement on prednisone, but then relapsed off of it  - He has established with rheumatology and was started on methotrexate but was unable to tolerate, now back on prednisone  - Continue prednisone for now, continue rheum follow-up    3. Elevated transaminases  - AST is 224, ALT 354  - Had been normal in July  - No abdominal pain or N/V/D - EBV IgG positive but IgM negative recently  - CT abd/pelvis negative for acute findings  - Viral hepatitis panel sent from ED - Trend, follow-up hepatitis panel   4. Thrombocytosis  - Platelets 746k on admission  - Most likely secondary to RA, will monitor   5. Hypothyroidism  - Continue Synthroid   6. CAD  - Based on CT-findings  - No anginal complaints   - Continue beta-blocker, hold statin in light of elevated transaminases    DVT prophylaxis: Lovenox Code Status: Full  Family Communication: Discussed with patient  Consults called: ID  Admission status: Observation     Vianne Bulls, MD [facility] Pager [phone]  If 7PM-7AM, please contact night-coverage [email or web address] Password [facility]  07/30/2018, 12:47 AM

## 2018-07-30 NOTE — Progress Notes (Signed)
Upon nurses assessment pt stating he felt like he was getting a fever, RN assessed pt tempeture in which increase by two degrees, also while taking pt vital, the pt was found to be tachy at 111-114 at this time. Will make MD aware.

## 2018-07-30 NOTE — Consult Note (Addendum)
[facility]         Reason for Consult: FUO    Referring Physician: Eliseo Squires  Principal Problem:   SIRS (systemic inflammatory response syndrome) ([place]) Active Problems:   Subclinical hypothyroidism   Coronary artery calcification seen on CT scan   Fever   Rheumatoid arthritis (HCC)   Thrombocytosis (HCC)   Elevated transaminase level    HPI: Tyler Schmidt is a 52 y.o. male who has recently started to have constellation of symptoms of low grade fevers, arthralgias, recently thought to be due to rheumatoid arthritis -did a brief trial of MTX for 2 wks with some improvement of diffuse arthralgias but still taking ibuprofen and tylenol Q4hr for management of arthritis. In the last 4 wks having fevers, with lower extremity fatigue and jaw claudication; labs also showing  LFTs previously normal 20s go up to 200-300ss.   His symptoms originally started in early June when he noticed having left shoulder pain then migrate to right shoulder pain, then over the course of next few weeks all joints having discomfort- low back in particular his ankles his knees his hips and his hands most notably the second finger on his right hand and thumb on his left hand more so than the right.  He also notes wrist pain most notably on the right.  He denies any falls or trauma.  He denies any redness or warmth.  He  denies any preceding febrile illness at this time.Dr Charlett Blake started him on steroids due to +RF of 1:140 and then sent to see Dr Kathrynn Ducking - for further management. He was given MTX for roughly 2 wk and then discontinued since he had vertigo associated side effect. Decision was made to stop MTX but did not start any other agent, and was told to continue on ibuprofen and acetominophen.   His features started to change at the end of august, when he had  developed fevers, myalgias, malaise and dry cough- with fevers. But mostly noticed having fatigued due to ambulation, weakness to  quads, calf with regular ambulation about his home. He also noticed having jaw soreness/claudication, though no loss of appetite, was not able to finish his meals. He has lost roughly 10 lb.. Due to lower extremity weakness he underwent NCS by dr patel on 9/19 that did not show any evidence of a sensorimotor polyneuropathy or lumbosacral radiculopathy.  5 days prior to admission, he was seen at rheumatology office that noticed increase LFTs, instructed to stop taking tylenol and ibuprofen and given a im dose of steroids plus taking prednisone 39m daily. 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 Has 50yo boy-girl twins in good state of health. His wife is in good state of health.   Past Medical History:  Diagnosis Date  . Adrenal adenoma, left    Incidentaloma noted on noncontrast chest CT done to f/u lung nodule seen on chest radiograph (10/2016).  . Allergy   . Anxiety 08/12/2012  . Atypical chest pain Fall 2013   Stress echo NORMAL 08/30/12  . Barrett esophagus 2012  . GERD (gastroesophageal reflux disease)   . Hyperlipidemia    HDL high, LDL high: simvastatin resulted in prolonged/severe muscle pain, led to long w/u and pt wants to avoid further statin unless lipids severely worsen  . Neck pain 08/12/2012  . Nephrolithiasis 10/2016   Bilat,  nonobstruct, noted on CT chest done for f/u of pulm nodule seen on chest radiograph  . Palpitations Fall/winter 2017   Improved with lopressor  . Subclinical hypothyroidism    Patient give hx of hypfunctioning goiter in the distant past, says he was given pills to "kill" the goiter.  Says bx of goiter was benign.    Allergies:  Allergies  Allergen Reactions  . Simvastatin Other (See Comments)    myalgias  . Atorvastatin Other (See Comments)    Joint pain and low back pain    MEDICATIONS: . B-complex with vitamin C  1 tablet Oral Daily  .  enoxaparin (LOVENOX) injection  40 mg Subcutaneous Q24H  . levothyroxine  25 mcg Oral QAC breakfast  . metoprolol tartrate  25 mg Oral BID  . omega-3 acid ethyl esters  1 g Oral Daily  . predniSONE  20 mg Oral Daily    Social History   Tobacco Use  . Smoking status: Former Smoker    Types: Cigarettes    Last attempt to quit: 08/18/1979    Years since quitting: 38.9  . Smokeless tobacco: Former Systems developer    Types: Chew  . Tobacco comment: has not used since age 26  Substance Use Topics  . Alcohol use: No    Alcohol/week: 0.0 standard drinks  . Drug use: No    Family History  Problem Relation Age of Onset  . Heart disease Father        Died of MI age 55, possible PE  . Heart disease Paternal Uncle        MI's in his 21s  . Heart disease Paternal Uncle   . Colon cancer Neg Hx     Review of Systems -  Per hpi otherwise 12 poitn ros is negative. No headache, no blurry vision  OBJECTIVE: Temp:  [97.9 F (36.6 C)-99.9 F (37.7 C)] 98.8 F (37.1 C) (09/28 1100) Pulse Rate:  [91-120] 98 (09/28 1100) Resp:  [13-23] 18 (09/28 1100) BP: (121-157)/(81-96) 122/87 (09/28 1100) SpO2:  [96 %-100 %] 99 % (09/28 1100) Weight:  [70.3 kg-71.2 kg] 71.2 kg (09/27 2329) Physical Exam  Constitutional: He is oriented to person, place, and time. He appears well-developed and well-nourished. No distress.  HENT:  Mouth/Throat: Oropharynx is clear and moist. No oropharyngeal exudate.  Cardiovascular: Normal rate, regular rhythm and normal heart sounds. Exam reveals no gallop and no friction rub.  No murmur heard.  Pulmonary/Chest: Effort normal and breath sounds normal. No respiratory distress. He has no wheezes.  Abdominal: Soft. Bowel sounds are normal. He exhibits no distension. There is no tenderness.  Lymphadenopathy:  He has no cervical adenopathy.  Neurological: He is alert and oriented to person, place, and time.  Skin: Skin is warm and dry. No rash noted. No erythema.  Psychiatric: He  has a normal mood and affect. His behavior is normal.    LABS: Results for orders placed or performed during the hospital encounter of 07/29/18 (from the past 48 hour(s))  Blood culture (routine x 2)     Status: None (Preliminary result)   Collection Time: 07/29/18  5:00 PM  Result Value Ref Range   Specimen Description BLOOD RIGHT ANTECUBITAL    Special Requests      BOTTLES DRAWN AEROBIC AND ANAEROBIC Blood Culture results may not be optimal due to an excessive volume of blood received in culture bottles   Culture      NO GROWTH < 24 HOURS Performed at [facility]  Hospital Lab, [place] [address] [place], [place] [address]    Report Status PENDING   CBC with Differential     Status: Abnormal   Collection Time: 07/29/18  5:18 PM  Result Value Ref Range   WBC 22.7 (H) 4.0 - 10.5 K/uL   RBC 4.34 4.22 - 5.81 MIL/uL   Hemoglobin 11.9 (L) 13.0 - 17.0 g/dL   HCT 38.0 (L) 39.0 - 52.0 %   MCV 87.6 78.0 - 100.0 fL   MCH 27.4 26.0 - 34.0 pg   MCHC 31.3 30.0 - 36.0 g/dL   RDW 14.9 11.5 - 15.5 %   Platelets 746 (H) 150 - 400 K/uL   Neutrophils Relative % 89 %   Lymphocytes Relative 7 %   Monocytes Relative 3 %   Eosinophils Relative 0 %   Basophils Relative 1 %   Neutro Abs 20.2 (H) 1.7 - 7.7 K/uL   Lymphs Abs 1.6 0.7 - 4.0 K/uL   Monocytes Absolute 0.7 0.1 - 1.0 K/uL   Eosinophils Absolute 0.0 0.0 - 0.7 K/uL   Basophils Absolute 0.2 (H) 0.0 - 0.1 K/uL   WBC Morphology MILD LEFT SHIFT (1-5% METAS, OCC MYELO, OCC BANDS)     Comment: Performed at [facility], [address], [place], [place] [address]  Comprehensive metabolic panel     Status: Abnormal   Collection Time: 07/29/18  5:18 PM  Result Value Ref Range   Sodium 132 (L) 135 - 145 mmol/L   Potassium 4.3 3.5 - 5.1 mmol/L   Chloride 96 (L) 98 - 111 mmol/L   CO2 21 (L) 22 - 32 mmol/L   Glucose, Bld 117 (H) 70 - 99 mg/dL   BUN 16 6 - 20 mg/dL   Creatinine, Ser 0.70 0.61 - 1.24 mg/dL   Calcium 8.5 (L) 8.9 - 10.3 mg/dL   Total  Protein 6.8 6.5 - 8.1 g/dL   Albumin 2.2 (L) 3.5 - 5.0 g/dL   AST 224 (H) 15 - 41 U/L   ALT 354 (H) 0 - 44 U/L   Alkaline Phosphatase 237 (H) 38 - 126 U/L   Total Bilirubin 0.7 0.3 - 1.2 mg/dL   GFR calc non Af Amer >60 >60 mL/min   GFR calc Af Amer >60 >60 mL/min    Comment: (NOTE) The eGFR has been calculated using the CKD EPI equation. This calculation has not been validated in all clinical situations. eGFR's persistently <60 mL/min signify possible Chronic Kidney Disease.    Anion gap 15 5 - 15    Comment: Performed at [facility] [address], [place], [place] [address]  Blood culture (routine x 2)     Status: None (Preliminary result)   Collection Time: 07/29/18  8:31 PM  Result Value Ref Range   Specimen Description BLOOD RIGHT FOREARM    Special Requests      BOTTLES DRAWN AEROBIC AND ANAEROBIC Blood Culture results may not be optimal due to an inadequate volume of blood received in culture bottles   Culture      NO GROWTH < 12 HOURS Performed at [facility] [address] [place],  [address]    Report Status PENDING   Acetaminophen level     Status: Abnormal   Collection Time: 07/29/18  8:31 PM  Result Value Ref Range   Acetaminophen (Tylenol), Serum <10 (L) 10 - 30 ug/mL    Comment: (NOTE) Therapeutic concentrations vary significantly. A range of 10-30 ug/mL  may be an effective concentration for many patients. However, some  are best treated at concentrations outside of this range. Acetaminophen concentrations >150 ug/mL at 4 hours after ingestion  and >50 ug/mL at 12 hours after ingestion are often associated with  toxic reactions. Performed at [facility], [place] [address], [place], [place] [address]   I-Stat CG4 Lactic Acid, ED     Status: None   Collection Time: 07/29/18  8:38 PM  Result Value Ref Range   Lactic Acid, Venous 1.30 0.5 - 1.9 mmol/L  CK     Status: Abnormal   Collection Time: 07/29/18  9:38 PM  Result Value Ref  Range   Total CK 10 (L) 49 - 397 U/L    Comment: Performed at [facility], [place] [address] [place], [place] [address]  Urinalysis, Routine w reflex microscopic     Status: Abnormal   Collection Time: 07/29/18  9:57 PM  Result Value Ref Range   Color, Urine STRAW (A) YELLOW   APPearance CLEAR CLEAR   Specific Gravity, Urine 1.017 1.005 - 1.030   pH 6.0 5.0 - 8.0   Glucose, UA NEGATIVE NEGATIVE mg/dL   Hgb urine dipstick NEGATIVE NEGATIVE   Bilirubin Urine NEGATIVE NEGATIVE   Ketones, ur NEGATIVE NEGATIVE mg/dL   Protein, ur NEGATIVE NEGATIVE mg/dL   Nitrite NEGATIVE NEGATIVE   Leukocytes, UA NEGATIVE NEGATIVE    Comment: Performed at [facility] [address], [place], [place] [address]  Sedimentation rate     Status: Abnormal   Collection Time: 07/29/18 10:09 PM  Result Value Ref Range   Sed Rate 80 (H) 0 - 16 mm/hr    Comment: Performed at [facility] [address], [place], [place] [address]  C-reactive protein     Status: Abnormal   Collection Time: 07/29/18 10:09 PM  Result Value Ref Range   CRP 9.4 (H) <1.0 mg/dL    Comment: Performed at [facility], [place] [address] [place], [place] [address]  Comprehensive metabolic panel     Status: Abnormal   Collection Time: 07/30/18  6:29 AM  Result Value Ref Range   Sodium 134 (L) 135 - 145 mmol/L   Potassium 3.3 (L) 3.5 - 5.1 mmol/L   Chloride 103 98 - 111 mmol/L   CO2 24 22 - 32 mmol/L   Glucose, Bld 108 (H) 70 - 99 mg/dL   BUN 13 6 - 20 mg/dL   Creatinine, Ser 0.77 0.61 - 1.24 mg/dL   Calcium 7.7 (L) 8.9 - 10.3 mg/dL   Total Protein 5.1 (L) 6.5 - 8.1 g/dL   Albumin 1.8 (L) 3.5 - 5.0 g/dL   AST 96 (H) 15 - 41 U/L   ALT 221 (H) 0 - 44 U/L   Alkaline Phosphatase 188 (H) 38 - 126 U/L   Total Bilirubin 0.4 0.3 - 1.2 mg/dL   GFR calc non Af Amer >60 >60 mL/min   GFR calc Af Amer >60 >60 mL/min    Comment: (NOTE) The eGFR has been calculated using the CKD EPI equation. This calculation has not  been validated in all clinical situations. eGFR's persistently <60 mL/min signify possible Chronic Kidney Disease.    Anion gap 7 5 - 15    Comment: Performed at [facility] [address], [place], [place] [address]  CBC WITH DIFFERENTIAL     Status: Abnormal   Collection Time: 07/30/18  6:29 AM  Result  Value Ref Range   WBC 18.7 (H) 4.0 - 10.5 K/uL   RBC 3.54 (L) 4.22 - 5.81 MIL/uL   Hemoglobin 9.8 (L) 13.0 - 17.0 g/dL   HCT 31.3 (L) 39.0 - 52.0 %   MCV 88.4 78.0 - 100.0 fL   MCH 27.7 26.0 - 34.0 pg   MCHC 31.3 30.0 - 36.0 g/dL   RDW 15.0 11.5 - 15.5 %   Platelets 566 (H) 150 - 400 K/uL   Neutrophils Relative % 75 %   Neutro Abs 14.3 (H) 1.7 - 7.7 K/uL   Lymphocytes Relative 9 %   Lymphs Abs 1.7 0.7 - 4.0 K/uL   Monocytes Relative 7 %   Monocytes Absolute 1.2 (H) 0.1 - 1.0 K/uL   Eosinophils Relative 3 %   Eosinophils Absolute 0.5 0.0 - 0.7 K/uL   Basophils Relative 1 %   Basophils Absolute 0.1 0.0 - 0.1 K/uL   Immature Granulocytes 5 %   Abs Immature Granulocytes 0.9 (H) 0.0 - 0.1 K/uL    Comment: Performed at [facility], [address], [place], [place] [address]    MICRO:  IMAGING: Ct Abdomen Pelvis W Contrast  Result Date: 07/29/2018 CLINICAL DATA:  Elevated liver function tests and low-grade fever. Recently diagnosed with rheumatoid arthritis. Known left adrenal adenoma. EXAM: CT ABDOMEN AND PELVIS WITH CONTRAST TECHNIQUE: Multidetector CT imaging of the abdomen and pelvis was performed using the standard protocol following bolus administration of intravenous contrast. CONTRAST:  162m ISOVUE-300 IOPAMIDOL (ISOVUE-300) INJECTION 61% COMPARISON:  Chest CT dated 10/16/2016. FINDINGS: Lower chest: Interval mild linear atelectasis or scarring at both lung bases. Hepatobiliary: No focal liver abnormality is seen. No gallstones, gallbladder wall thickening, or biliary dilatation. Pancreas: Unremarkable. No pancreatic ductal dilatation or surrounding inflammatory  changes. Spleen: Normal in size without focal abnormality. Adrenals/Urinary Tract: The previously demonstrated 2.4 x 2.3 cm low density left adrenal mass currently measures 2.6 x 2.5 cm. Normal appearing right adrenal gland. 9 mm mid right renal cyst and additional tiny right renal cysts. Tiny lower pole right renal calculus. Multiple small left renal calculi, the largest measuring 7 mm in maximum diameter, in the lower pole . No bladder or ureteral calculi seen. No hydronephrosis. Stomach/Bowel: Stomach is within normal limits. Appendix appears normal. No evidence of bowel wall thickening, distention, or inflammatory changes. Vascular/Lymphatic: No significant vascular findings are present. No enlarged abdominal or pelvic lymph nodes. Reproductive: Mildly enlarged prostate gland. Other: Small proximal left inguinal hernia containing fat. Small umbilical hernia containing fat. Musculoskeletal: Mild lumbar and lower thoracic spine degenerative changes. IMPRESSION: 1. No acute abnormality. 2. Stable left adrenal adenoma. 3. Small, nonobstructing bilateral renal calculi. 4. Mild prostatic hypertrophy. Electronically Signed   By: SClaudie ReveringM.D.   On: 07/29/2018 17:04  NCV & EMG Findings: Electrodiagnostic testing of the right lower extremity and additional studies of the left shows: 1. Bilateral sural and superficial peroneal sensory responses are within normal limits. 2. Bilateral peroneal and tibial motor responses are within normal limits. 3. Bilateral tibial H reflex studies are within normal limits. 4. There is no evidence of active or chronic motor axonal changes affecting any of the tested muscles.  Motor unit configuration and recruitment pattern is within normal limits.   Assessment/Plan:  89yoM with FUO, lower extremity weakness, fatigue, and jaw claudication and LFT that are improving but in the 200s. DDX .  an auto-immune process. Such as vasculitis - PMR with Giant Cell Arteritis- though it  would be unlikely given his age.   - would recommend high dose steroids of pred 60 daily or pred 40 bid to see if improvement in his symptoms - would do lab work for vasculitis- such as p-anca, c-anca - agree with rechecking anti CCP, ferritin, - will discuss with radiology if there is imaging of choice to help with vasculitis work up - agree with dr vann's plans to contact dr Amil Amen on Monday for update and recs  Fevers with LFTs - would check CMV igM and IgG, and work up for auto-immune hepatitis - though his LFTs elevation could possibly due to tylenol 3gm daily x 3-4 wk.  Repeat blood cx to rule out acute bacterial process.  Chest CT to see if any LN needing biopsy for path.

## 2018-07-30 NOTE — Progress Notes (Signed)
Upon reassessment pt tempeture had gone up to 99.9 but heart rate gone down to 100 pt states he feels better at this time will continue to monitor pt, and make MD aware.

## 2018-07-30 NOTE — Progress Notes (Addendum)
Patient admitted after midnight, please see H&P.  Recent diagnosis of RA after developing shoulder and hip pain.  Started on methotrexate but did not tolerate.  He stopped that medication and then developed "fevers", muscle weakness/fatigue needing daily ibuprofen/tylenol.  He also developed a dry cough around that time.   Numerous labs have been done outpatient and patient recently started on steroids.  Elevated LFTs, CRP/SED rate Recent CT abd/pelvis- incidentaloma (slightly larger since 2017) +weight loss due to fatigue from jaws when chewing No diplopia/facial droop. -denies overseas travel, denies drugs/alochol, OTC herbal supplements +mosquito bites - no DVT symptoms -recent tick bite  Multiple labs ordered and are pending -CT chest pending  ID consult appreciated  ? BMB/hematology consult ? Autoimmune hepatitis?  Eulogio Bear DO

## 2018-07-31 ENCOUNTER — Observation Stay (HOSPITAL_COMMUNITY): Payer: BLUE CROSS/BLUE SHIELD

## 2018-07-31 DIAGNOSIS — R509 Fever, unspecified: Secondary | ICD-10-CM | POA: Diagnosis not present

## 2018-07-31 DIAGNOSIS — R740 Nonspecific elevation of levels of transaminase and lactic acid dehydrogenase [LDH]: Secondary | ICD-10-CM

## 2018-07-31 DIAGNOSIS — E039 Hypothyroidism, unspecified: Secondary | ICD-10-CM | POA: Diagnosis not present

## 2018-07-31 DIAGNOSIS — R7989 Other specified abnormal findings of blood chemistry: Secondary | ICD-10-CM | POA: Diagnosis not present

## 2018-07-31 DIAGNOSIS — M6281 Muscle weakness (generalized): Secondary | ICD-10-CM | POA: Diagnosis not present

## 2018-07-31 DIAGNOSIS — M0579 Rheumatoid arthritis with rheumatoid factor of multiple sites without organ or systems involvement: Secondary | ICD-10-CM | POA: Diagnosis not present

## 2018-07-31 DIAGNOSIS — D473 Essential (hemorrhagic) thrombocythemia: Secondary | ICD-10-CM

## 2018-07-31 LAB — URINE CULTURE: CULTURE: NO GROWTH

## 2018-07-31 LAB — CBC
HCT: 35 % — ABNORMAL LOW (ref 39.0–52.0)
Hemoglobin: 11.1 g/dL — ABNORMAL LOW (ref 13.0–17.0)
MCH: 28 pg (ref 26.0–34.0)
MCHC: 31.7 g/dL (ref 30.0–36.0)
MCV: 88.2 fL (ref 78.0–100.0)
PLATELETS: 688 10*3/uL — AB (ref 150–400)
RBC: 3.97 MIL/uL — ABNORMAL LOW (ref 4.22–5.81)
RDW: 14.9 % (ref 11.5–15.5)
WBC: 18.2 10*3/uL — ABNORMAL HIGH (ref 4.0–10.5)

## 2018-07-31 LAB — COMPREHENSIVE METABOLIC PANEL
ALK PHOS: 187 U/L — AB (ref 38–126)
ALT: 202 U/L — AB (ref 0–44)
ANION GAP: 8 (ref 5–15)
AST: 60 U/L — ABNORMAL HIGH (ref 15–41)
Albumin: 2 g/dL — ABNORMAL LOW (ref 3.5–5.0)
BILIRUBIN TOTAL: 0.5 mg/dL (ref 0.3–1.2)
BUN: 16 mg/dL (ref 6–20)
CALCIUM: 8.5 mg/dL — AB (ref 8.9–10.3)
CO2: 24 mmol/L (ref 22–32)
CREATININE: 0.63 mg/dL (ref 0.61–1.24)
Chloride: 104 mmol/L (ref 98–111)
GFR calc non Af Amer: >60 mL/min (ref >60)
GLUCOSE: 120 mg/dL — AB (ref 70–99)
Potassium: 3.9 mmol/L (ref 3.5–5.1)
SODIUM: 136 mmol/L (ref 135–145)
TOTAL PROTEIN: 6 g/dL — AB (ref 6.5–8.1)

## 2018-07-31 LAB — EHRLICHIA ANTIBODY PANEL

## 2018-07-31 LAB — HEPATITIS PANEL, ACUTE
HCV Ab: 0.3 s/co ratio (ref 0.0–0.9)
HEP A IGM: NEGATIVE
HEP B C IGM: NEGATIVE
Hepatitis B Surface Ag: NEGATIVE

## 2018-07-31 LAB — B. BURGDORFI ANTIBODIES: B burgdorferi Ab IgG+IgM: <0.9 index

## 2018-07-31 LAB — ROCKY MTN SPOTTED FVR ABS PNL(IGG+IGM)
RMSF IGM: NOT DETECTED
RMSF IgG: NOT DETECTED

## 2018-07-31 MED ORDER — IOPAMIDOL (ISOVUE-370) INJECTION 76%
INTRAVENOUS | Status: AC
  Administered 2018-07-31: 100 mL via INTRAVENOUS
  Filled 2018-07-31: qty 100

## 2018-07-31 MED ORDER — IOPAMIDOL (ISOVUE-370) INJECTION 76%
100.0000 mL | Freq: Once | INTRAVENOUS | Status: AC | PRN
  Administered 2018-07-31 (×2): 100 mL via INTRAVENOUS

## 2018-07-31 NOTE — Progress Notes (Signed)
Progress Note    Tyler Schmidt  ZSW:[id number] DOB: July 29, 1966  DOA: 07/29/2018 PCP: Mosie Lukes, MD    Brief Narrative:     Medical records reviewed and are as summarized below:  Tyler Schmidt is an 52 y.o. male with medical history significant for hypothyroidism, coronary calcium on CT, and recently diagnosed rheumatoid arthritis, presenting to the emergency department for evaluation of recurrent fevers, chills, and malaise.  Patient reports that symptoms developed 2 to 3 months ago, had an infectious work-up that was negative, had serologies consistent with RA, diagnosis was confirmed by rheumatologist, his condition improved drastically with prednisone, but when he was changed to methotrexate and unable to tolerate the medication, symptoms returned.  He is now back on prednisone 20 mg daily, but reports continued fevers intermittently, occurring once or twice daily, treated at home with Advil, and associated with general malaise and fatigue.  He had been experiencing diffuse arthralgias, but not currently.  Denies any significant shortness of breath, cough, abdominal pain, nausea, vomiting, diarrhea, or dysuria.  ED Course: Upon arrival to the ED, patient is found to be afebrile, saturating well on room air, and with vitals otherwise stable.  EKG features a sinus rhythm with nonspecific ST abnormality.  Chest x-ray is negative for acute cardiopulmonary disease.  CT abdomen and pelvis is negative for acute findings.  Chemistry panel is notable for mild hyponatremia and mild elevation in transaminases.  CBC features a leukocytosis to 22,700 and a thrombocytosis with platelets 746,000.  Blood cultures were collected and the patient was given a liter of normal saline in the ED.  Infectious disease was consulted by ED physician and recommended admission with repeat blood cultures in the morning.  Assessment/Plan:   Principal Problem:   SIRS (systemic inflammatory response  syndrome) (HCC) Active Problems:   Subclinical hypothyroidism   Coronary artery calcification seen on CT scan   Fever   Rheumatoid arthritis (HCC)   Thrombocytosis (HCC)   Elevated transaminase level  Fever and body aches due to suspected vasculitis (no rash) -able to eat better with less jaw fatigue -started on high dose prednisone -CTAs to evaluate for vessel involvement -multiple labs pending (ANCA, parvo, Anti smooth muscle, smear)- does not appear to be infectious -per patient the PA with rheumatology did not think was related to his RA (weakness/fevers)  leuckoytosis -? Reactive from prednisone  Hypothyroidism -on synthroid  RA -follows with Dr. Amil Amen, rheumatology -did not tolerate methotrexate  Elevated LFTs -trending down    Family Communication/Anticipated D/C date and plan/Code Status   DVT prophylaxis: Lovenox ordered. Code Status: Full Code.  Family Communication: wife 9/28 Disposition Plan: pending CTAs and discuss with rheumatology   Medical Consultants:    ID  Subjective:   No feelings of fever or muscle pain last PM  Objective:    Vitals:   07/30/18 1745 07/30/18 2155 07/31/18 0601 07/31/18 1155  BP: 128/87 122/81 122/80 122/76  Pulse: (!) 102 94 67 73  Resp: 16 16 18 16   Temp: 98.2 F (36.8 C) 99.1 F (37.3 C) 97.6 F (36.4 C) 97.8 F (36.6 C)  TempSrc: Oral Oral Oral Oral  SpO2: 98% 95% 96% 95%  Weight:      Height:       No intake or output data in the 24 hours ending 07/31/18 1444 Filed Weights   07/29/18 1658 07/29/18 2329  Weight: 70.3 kg 71.2 kg    Exam: In bed, NAD rrr No increased work of breathing,  no wheezing No rashes or lesions No focal weakness  Data Reviewed:   I have personally reviewed following labs and imaging studies:  Labs: Labs show the following:   Basic Metabolic Panel: Recent Labs  Lab 07/28/18 1450 07/29/18 1718 07/30/18 0629 07/31/18 0718  NA 136 132* 134* 136  K 4.8 4.3 3.3*  3.9  CL 97 96* 103 104  CO2 29 21* 24 24  GLUCOSE 94 117* 108* 120*  BUN 21 16 13 16   CREATININE 0.78 0.70 0.77 0.63  CALCIUM 8.6 8.5* 7.7* 8.5*   GFR Estimated Creatinine Clearance: 111.3 mL/min (by C-G formula based on SCr of 0.63 mg/dL). Liver Function Tests: Recent Labs  Lab 07/28/18 1450 07/29/18 1718 07/30/18 0629 07/31/18 0718  AST 227* 224* 96* 60*  ALT 231* 354* 221* 202*  ALKPHOS 266* 237* 188* 187*  BILITOT 0.3 0.7 0.4 0.5  PROT 6.0 6.8 5.1* 6.0*  ALBUMIN 2.7* 2.2* 1.8* 2.0*   No results for input(s): LIPASE, AMYLASE in the last 168 hours. No results for input(s): AMMONIA in the last 168 hours. Coagulation profile No results for input(s): INR, PROTIME in the last 168 hours.  CBC: Recent Labs  Lab 07/28/18 1450 07/29/18 1718 07/30/18 0629 07/31/18 0718  WBC 17.1*  17.5* 22.7* 18.7* 18.2*  NEUTROABS 14,501*  15.3* 20.2* 14.3*  --   HGB 11.6*  11.6* 11.9* 9.8* 11.1*  HCT 34.4*  35.8* 38.0* 31.3* 35.0*  MCV 81.7  85.1 87.6 88.4 88.2  PLT 684*  691.0* 746* 566* 688*   Cardiac Enzymes: Recent Labs  Lab 07/29/18 2138  CKTOTAL 10*   BNP (last 3 results) No results for input(s): PROBNP in the last 8760 hours. CBG: No results for input(s): GLUCAP in the last 168 hours. D-Dimer: No results for input(s): DDIMER in the last 72 hours. Hgb A1c: No results for input(s): HGBA1C in the last 72 hours. Lipid Profile: No results for input(s): CHOL, HDL, LDLCALC, TRIG, CHOLHDL, LDLDIRECT in the last 72 hours. Thyroid function studies: No results for input(s): TSH, T4TOTAL, T3FREE, THYROIDAB in the last 72 hours.  Invalid input(s): FREET3 Anemia work up: Recent Labs    07/30/18 [place]*   Sepsis Labs: Recent Labs  Lab 07/28/18 1450 07/29/18 1718 07/29/18 2038 07/30/18 0629 07/31/18 0718  WBC 17.1*  17.5* 22.7*  --  18.7* 18.2*  LATICACIDVEN  --   --  1.30  --   --     Microbiology Recent Results (from the past 240 hour(s))    Blood culture (routine x 2)     Status: None (Preliminary result)   Collection Time: 07/29/18  5:00 PM  Result Value Ref Range Status   Specimen Description BLOOD RIGHT ANTECUBITAL  Final   Special Requests   Final    BOTTLES DRAWN AEROBIC AND ANAEROBIC Blood Culture results may not be optimal due to an excessive volume of blood received in culture bottles   Culture   Final    NO GROWTH 2 DAYS Performed at [facility] [address] [place], [place] [address]    Report Status PENDING  Incomplete  Blood culture (routine x 2)     Status: None (Preliminary result)   Collection Time: 07/29/18  8:31 PM  Result Value Ref Range Status   Specimen Description BLOOD RIGHT FOREARM  Final   Special Requests   Final    BOTTLES DRAWN AEROBIC AND ANAEROBIC Blood Culture results may not be optimal due to an inadequate  volume of blood received in culture bottles   Culture   Final    NO GROWTH 2 DAYS Performed at [facility], [place] [address] [place], [place] [address]    Report Status PENDING  Incomplete  Urine culture     Status: None   Collection Time: 07/30/18  1:40 AM  Result Value Ref Range Status   Specimen Description URINE, CATHETERIZED  Final   Special Requests NONE  Final   Culture   Final    NO GROWTH Performed at [facility], [place] [address], [place], [place] [address]    Report Status 07/31/2018 FINAL  Final  Culture, blood (Routine X 2) w Reflex to ID Panel     Status: None (Preliminary result)   Collection Time: 07/30/18  3:06 PM  Result Value Ref Range Status   Specimen Description BLOOD RIGHT ANTECUBITAL  Final   Special Requests   Final    BOTTLES DRAWN AEROBIC ONLY Blood Culture adequate volume   Culture   Final    NO GROWTH < 24 HOURS Performed at [facility], [place] [address], [place], [place] [address]    Report Status PENDING  Incomplete    Procedures and diagnostic studies:  Ct Chest Wo Contrast  Result Date: 07/30/2018 CLINICAL  DATA:  Rheumatoid arthritis.  Cough. EXAM: CT CHEST WITHOUT CONTRAST TECHNIQUE: Multidetector CT imaging of the chest was performed following the standard protocol without IV contrast. COMPARISON:  Chest CT 10/16/2016 FINDINGS: Cardiovascular: Normal unenhanced appearance of the heart and major thoracic vessels. Mediastinum/Nodes: No mediastinal or axillary lymphadenopathy. Normal course of the thoracic esophagus. Normal thyroid gland. Lungs/Pleura: The lungs are clear. No focal airspace consolidation, pleural effusion or pneumothorax. No nodules or masses. Upper Abdomen: There is a 2.5 cm left adrenal nodule with attenuation value of 3.4 HU, consistent with adrenal adenoma. The other visible upper abdominal organs are normal. Musculoskeletal: Normal. IMPRESSION: 1. Normal chest CT. 2. Incidentally noted left adrenal adenoma, unchanged. Electronically Signed   By: Ulyses Jarred M.D.   On: 07/30/2018 17:40   Ct Abdomen Pelvis W Contrast  Result Date: 07/29/2018 CLINICAL DATA:  Elevated liver function tests and low-grade fever. Recently diagnosed with rheumatoid arthritis. Known left adrenal adenoma. EXAM: CT ABDOMEN AND PELVIS WITH CONTRAST TECHNIQUE: Multidetector CT imaging of the abdomen and pelvis was performed using the standard protocol following bolus administration of intravenous contrast. CONTRAST:  168mL ISOVUE-300 IOPAMIDOL (ISOVUE-300) INJECTION 61% COMPARISON:  Chest CT dated 10/16/2016. FINDINGS: Lower chest: Interval mild linear atelectasis or scarring at both lung bases. Hepatobiliary: No focal liver abnormality is seen. No gallstones, gallbladder wall thickening, or biliary dilatation. Pancreas: Unremarkable. No pancreatic ductal dilatation or surrounding inflammatory changes. Spleen: Normal in size without focal abnormality. Adrenals/Urinary Tract: The previously demonstrated 2.4 x 2.3 cm low density left adrenal mass currently measures 2.6 x 2.5 cm. Normal appearing right adrenal gland. 9 mm  mid right renal cyst and additional tiny right renal cysts. Tiny lower pole right renal calculus. Multiple small left renal calculi, the largest measuring 7 mm in maximum diameter, in the lower pole . No bladder or ureteral calculi seen. No hydronephrosis. Stomach/Bowel: Stomach is within normal limits. Appendix appears normal. No evidence of bowel wall thickening, distention, or inflammatory changes. Vascular/Lymphatic: No significant vascular findings are present. No enlarged abdominal or pelvic lymph nodes. Reproductive: Mildly enlarged prostate gland. Other: Small proximal left inguinal hernia containing fat. Small umbilical hernia containing fat. Musculoskeletal: Mild lumbar and lower  thoracic spine degenerative changes. IMPRESSION: 1. No acute abnormality. 2. Stable left adrenal adenoma. 3. Small, nonobstructing bilateral renal calculi. 4. Mild prostatic hypertrophy. Electronically Signed   By: Claudie Revering M.D.   On: 07/29/2018 17:04    Medications:   . B-complex with vitamin C  1 tablet Oral Daily  . enoxaparin (LOVENOX) injection  40 mg Subcutaneous Q24H  . levothyroxine  25 mcg Oral QAC breakfast  . metoprolol tartrate  25 mg Oral BID  . omega-3 acid ethyl esters  1 g Oral Daily  . pantoprazole  40 mg Oral BID  . predniSONE  40 mg Oral BID WC   Continuous Infusions:   LOS: 0 days   Geradine Girt  [facility]   *Please refer to [email or web address], password [email or web address] to get updated schedule on who will round on this patient, as hospitalists switch teams weekly. If 7PM-7AM, please contact night-coverage at [email or web address], password [email or web address] for any overnight needs.  07/31/2018, 2:44 PM

## 2018-07-31 NOTE — Progress Notes (Signed)
[facility] for Infectious Disease    Date of Admission:  07/29/2018      ID: Tyler Schmidt is a 52 y.o. male with lower extremity weakness, jaw claudication and FUO x 4 wk Principal Problem:   SIRS (systemic inflammatory response syndrome) (HCC) Active Problems:   Subclinical hypothyroidism   Coronary artery calcification seen on CT scan   Fever   Rheumatoid arthritis (HCC)   Thrombocytosis (HCC)   Elevated transaminase level    Subjective: Patient feels better since starting steroids, though still fatigue after standing for shower.appetite improved, feels like he has more energy  Medications:  . B-complex with vitamin C  1 tablet Oral Daily  . enoxaparin (LOVENOX) injection  40 mg Subcutaneous Q24H  . levothyroxine  25 mcg Oral QAC breakfast  . metoprolol tartrate  25 mg Oral BID  . omega-3 acid ethyl esters  1 g Oral Daily  . pantoprazole  40 mg Oral BID  . predniSONE  40 mg Oral BID WC    Objective: Vital signs in last 24 hours: Temp:  [97.6 F (36.4 C)-99.1 F (37.3 C)] 97.8 F (36.6 C) (09/29 1155) Pulse Rate:  [67-102] 73 (09/29 1155) Resp:  [16-18] 16 (09/29 1155) BP: (122-128)/(76-87) 122/76 (09/29 1155) SpO2:  [95 %-98 %] 95 % (09/29 1155) Physical Exam  Constitutional: He is oriented to person, place, and time. He appears well-developed and well-nourished. No distress.  HENT:  Mouth/Throat: Oropharynx is clear and moist. No oropharyngeal exudate.  Cardiovascular: Normal rate, regular rhythm and normal heart sounds. Exam reveals no gallop and no friction rub.  No murmur heard.  Pulmonary/Chest: Effort normal and breath sounds normal. No respiratory distress. He has no wheezes.  Abdominal: Soft. Bowel sounds are normal. He exhibits no distension. There is no tenderness.  Lymphadenopathy:  He has no cervical adenopathy.  Neurological: He is alert and oriented to person, place, and time.  Skin: Skin is warm and dry. No rash noted. No erythema.    Psychiatric: He has a normal mood and affect. His behavior is normal.    Lab Results Recent Labs    07/30/18 0629 07/31/18 0718  WBC 18.7* 18.2*  HGB 9.8* 11.1*  HCT 31.3* 35.0*  NA 134* 136  K 3.3* 3.9  CL 103 104  CO2 24 24  BUN 13 16  CREATININE 0.77 0.63   Liver Panel Recent Labs    07/30/18 0629 07/31/18 0718  PROT 5.1* 6.0*  ALBUMIN 1.8* 2.0*  AST 96* 60*  ALT 221* 202*  ALKPHOS 188* 187*  BILITOT 0.4 0.5   Sedimentation Rate Recent Labs    07/29/18 2209  ESRSEDRATE 80*   C-Reactive Protein Recent Labs    07/29/18 2209  CRP 9.4*  ferritin 1440  Microbiology: Blood cx ngtd Studies/Results: Ct Chest Wo Contrast  Result Date: 07/30/2018 CLINICAL DATA:  Rheumatoid arthritis.  Cough. EXAM: CT CHEST WITHOUT CONTRAST TECHNIQUE: Multidetector CT imaging of the chest was performed following the standard protocol without IV contrast. COMPARISON:  Chest CT 10/16/2016 FINDINGS: Cardiovascular: Normal unenhanced appearance of the heart and major thoracic vessels. Mediastinum/Nodes: No mediastinal or axillary lymphadenopathy. Normal course of the thoracic esophagus. Normal thyroid gland. Lungs/Pleura: The lungs are clear. No focal airspace consolidation, pleural effusion or pneumothorax. No nodules or masses. Upper Abdomen: There is a 2.5 cm left adrenal nodule with attenuation value of 3.4 HU, consistent with adrenal adenoma. The other visible upper abdominal organs are normal. Musculoskeletal: Normal. IMPRESSION: 1. Normal chest CT.  2. Incidentally noted left adrenal adenoma, unchanged. Electronically Signed   By: Ulyses Jarred M.D.   On: 07/30/2018 17:40     Assessment/Plan: FUO with arthritis, myalgias, with LE weakness/aches, and jaw claudication - constellation suggestive of vasculitis. Recommend getting chest abd pelvis CTA to see if any signs that could explain lower extremity complaints +/- jaw claudication.  Recommend to contact rheumatology for further  assessment and work up  continue on prednisone 40mg  bid for now, then ask rheumatology their recommendations  Follow up on labs that have been sent; thus far, this does not seem like an infectious cause of his presenation  Leukocytosis = likely from steroid boost.  Thrombocytosis = likely acute phase reactant to underlying presentation.  transaminitis = appears trending downward  [facility] for Infectious Diseases Cell: [phone] Pager: [phone]  07/31/2018, 5:23 PM

## 2018-08-01 ENCOUNTER — Ambulatory Visit: Payer: BLUE CROSS/BLUE SHIELD | Admitting: Family Medicine

## 2018-08-01 ENCOUNTER — Encounter: Payer: BLUE CROSS/BLUE SHIELD | Admitting: Gastroenterology

## 2018-08-01 DIAGNOSIS — R7989 Other specified abnormal findings of blood chemistry: Secondary | ICD-10-CM | POA: Diagnosis not present

## 2018-08-01 DIAGNOSIS — R509 Fever, unspecified: Secondary | ICD-10-CM | POA: Diagnosis not present

## 2018-08-01 DIAGNOSIS — M0579 Rheumatoid arthritis with rheumatoid factor of multiple sites without organ or systems involvement: Secondary | ICD-10-CM | POA: Diagnosis not present

## 2018-08-01 DIAGNOSIS — R740 Nonspecific elevation of levels of transaminase and lactic acid dehydrogenase [LDH]: Secondary | ICD-10-CM | POA: Diagnosis not present

## 2018-08-01 LAB — COMPREHENSIVE METABOLIC PANEL
ALK PHOS: 156 U/L — AB (ref 38–126)
ALT: 291 U/L — ABNORMAL HIGH (ref 0–44)
ANION GAP: 8 (ref 5–15)
AST: 135 U/L — ABNORMAL HIGH (ref 15–41)
Albumin: 2 g/dL — ABNORMAL LOW (ref 3.5–5.0)
BUN: 18 mg/dL (ref 6–20)
CALCIUM: 8.4 mg/dL — AB (ref 8.9–10.3)
CO2: 25 mmol/L (ref 22–32)
CREATININE: 0.69 mg/dL (ref 0.61–1.24)
Chloride: 103 mmol/L (ref 98–111)
GFR calc non Af Amer: >60 mL/min (ref >60)
Glucose, Bld: 126 mg/dL — ABNORMAL HIGH (ref 70–99)
Potassium: 4.2 mmol/L (ref 3.5–5.1)
Sodium: 136 mmol/L (ref 135–145)
TOTAL PROTEIN: 5.8 g/dL — AB (ref 6.5–8.1)
Total Bilirubin: 0.4 mg/dL (ref 0.3–1.2)

## 2018-08-01 LAB — CBC
HCT: 33.3 % — ABNORMAL LOW (ref 39.0–52.0)
HEMOGLOBIN: 10.3 g/dL — AB (ref 13.0–17.0)
MCH: 27.4 pg (ref 26.0–34.0)
MCHC: 30.9 g/dL (ref 30.0–36.0)
MCV: 88.6 fL (ref 78.0–100.0)
PLATELETS: 760 10*3/uL — AB (ref 150–400)
RBC: 3.76 MIL/uL — AB (ref 4.22–5.81)
RDW: 14.9 % (ref 11.5–15.5)
WBC: 18.4 10*3/uL — ABNORMAL HIGH (ref 4.0–10.5)

## 2018-08-01 LAB — MPO/PR-3 (ANCA) ANTIBODIES

## 2018-08-01 LAB — ANTI-SMOOTH MUSCLE ANTIBODY, IGG: F-ACTIN AB IGG: 15 U (ref 0–19)

## 2018-08-01 LAB — EHRLICHIA ANTIBODY PANEL
E CHAFFEENSIS AB, IGG: NEGATIVE
E CHAFFEENSIS AB, IGM: NEGATIVE
E. CHAFFEENSIS IGG AB: NEGATIVE
E. Chaffeensis (HME) IgM Titer: NEGATIVE

## 2018-08-01 LAB — PATHOLOGIST SMEAR REVIEW

## 2018-08-01 MED ORDER — PREDNISONE 20 MG PO TABS: 40.0000 mg | ORAL_TABLET | Freq: Two times a day (BID) | ORAL | 0 refills | Status: DC

## 2018-08-01 MED ORDER — PANTOPRAZOLE SODIUM 40 MG PO TBEC: 40.0000 mg | DELAYED_RELEASE_TABLET | Freq: Two times a day (BID) | ORAL | 0 refills | Status: DC

## 2018-08-01 NOTE — Discharge Summary (Signed)
Physician Discharge Summary  Zakary Kimura ZHY:[id number] DOB: September 08, 1966 DOA: 07/29/2018  PCP: Mosie Lukes, MD  Admit date: 07/29/2018 Discharge date: 08/01/2018  Admitted From: home Discharge disposition: home   Recommendations for Outpatient Follow-Up:   1. Multiple pending labs: repeat ANCA, anti-smooth muscle, parvovirus, erlichia 2. Referral to [facility] for 2nd opinion and to review work up thus far at family request 3. PA Pearline Cables to arrange follow up this week and is agreeable with high dose steroids-- will wean at follow up   Discharge Diagnosis:   Active Problems:   Subclinical hypothyroidism   Abnormal TSH   Fever   Rheumatoid arthritis (HCC)   Thrombocytosis (HCC)   Elevated transaminase level    Discharge Condition: Improved.  Diet recommendation: Regular.  Wound care: None.  Code status: Full.   History of Present Illness:   Tyler Schmidt is a 52 y.o. male who has recently started to have constellation of symptoms of low grade fevers, arthralgias, recently thought to be due to rheumatoid arthritis -did a brief trial of MTX for 2 wks with some improvement of diffuse arthralgias but still taking ibuprofen and tylenol Q4hr for management of arthritis. In the last 4 wks having fevers, with lower extremity fatigue and jaw claudication; labs also showing  LFTs previously normal 20s go up to 200-300ss.   His symptoms originally started in early June when he noticed having left shoulder pain then migrate to right shoulder pain, then over the course of next few weeks all joints having discomfort- low back in particular his ankles his knees his hips and his hands most notably the second finger on his right hand and thumb on his left hand more so than the right. He also notes wrist pain most notably on the right. He denies any falls or trauma. He denies any redness or warmth. He denies any preceding febrile illness at this time.Dr Charlett Blake started him  on steroids due to +RF of 1:140 and then sent to see Dr Kathrynn Ducking - for further management. He was given MTX for roughly 2 wk and then discontinued since he had vertigo associated side effect. Decision was made to stop MTX but did not start any other agent, and was told to continue on ibuprofen and acetominophen.   His features started to change at the end of august, when he had  developed fevers, myalgias, malaise and dry cough- with fevers. But mostly noticed having fatigued due to ambulation, weakness to quads, calf with regular ambulation about his home. He also noticed having jaw soreness/claudication, though no loss of appetite, was not able to finish his meals. He has lost roughly 10 lb.. Due to lower extremity weakness he underwent NCS by dr patel on 9/19 that did not show any evidence of a sensorimotor polyneuropathy or lumbosacral radiculopathy.  5 days prior to admission, he was seen at rheumatology office that noticed increase LFTs, instructed to stop taking tylenol and ibuprofen and given a im dose of steroids plus taking prednisone 20mg  daily. Due to still feeling poorly, he was instructed to come to the ED for evaluation  Hospital Course by Problem:   Fever and body aches due to suspected autoimmune disorder -started on high dose prednisone and has improved tremendously -CTAs did not show vasculitis -multiple labs pending (ANCA, parvo, Anti smooth muscle)-- does not appear to be infectious and more likely rheumatologic -was able to speak with PA from rheumatology office: thinks all symptoms were related to RA- neg CCP,  neg vascultis panel, - PR3,   leuckoytosis -? Reactive from prednisone -no further elevated temperature since prednisone  Hypothyroidism -on synthroid -? Autoimmune related per PCP note  RA -follows with Dr. Amil Amen, rheumatology -did not tolerate methotrexate  Elevated LFTs -initally trended down but now trending up  -at [facility] Rheum GGT was also  elevated    Medical Consultants:    ID  Discharge Exam:   Vitals:   08/01/18 0500 08/01/18 1102  BP: 121/81 122/88  Pulse: 64 71  Resp: 14 16  Temp: 97.8 F (36.6 C) 98 F (36.7 C)  SpO2: 97% 97%   Vitals:   07/31/18 1155 07/31/18 1843 08/01/18 0500 08/01/18 1102  BP: 122/76 118/87 121/81 122/88  Pulse: 73 85 64 71  Resp: 16 18 14 16   Temp: 97.8 F (36.6 C) 97.7 F (36.5 C) 97.8 F (36.6 C) 98 F (36.7 C)  TempSrc: Oral Oral Oral Oral  SpO2: 95% 97% 97% 97%  Weight:   65.3 kg   Height:        General exam: Appears calm and comfortable. Walking w/o fatigue  The results of significant diagnostics from this hospitalization (including imaging, microbiology, ancillary and laboratory) are listed below for reference.     Procedures and Diagnostic Studies:   Ct Chest Wo Contrast  Result Date: 07/30/2018 CLINICAL DATA:  Rheumatoid arthritis.  Cough. EXAM: CT CHEST WITHOUT CONTRAST TECHNIQUE: Multidetector CT imaging of the chest was performed following the standard protocol without IV contrast. COMPARISON:  Chest CT 10/16/2016 FINDINGS: Cardiovascular: Normal unenhanced appearance of the heart and major thoracic vessels. Mediastinum/Nodes: No mediastinal or axillary lymphadenopathy. Normal course of the thoracic esophagus. Normal thyroid gland. Lungs/Pleura: The lungs are clear. No focal airspace consolidation, pleural effusion or pneumothorax. No nodules or masses. Upper Abdomen: There is a 2.5 cm left adrenal nodule with attenuation value of 3.4 HU, consistent with adrenal adenoma. The other visible upper abdominal organs are normal. Musculoskeletal: Normal. IMPRESSION: 1. Normal chest CT. 2. Incidentally noted left adrenal adenoma, unchanged. Electronically Signed   By: Ulyses Jarred M.D.   On: 07/30/2018 17:40   Ct Abdomen Pelvis W Contrast  Result Date: 07/29/2018 CLINICAL DATA:  Elevated liver function tests and low-grade fever. Recently diagnosed with rheumatoid  arthritis. Known left adrenal adenoma. EXAM: CT ABDOMEN AND PELVIS WITH CONTRAST TECHNIQUE: Multidetector CT imaging of the abdomen and pelvis was performed using the standard protocol following bolus administration of intravenous contrast. CONTRAST:  134mL ISOVUE-300 IOPAMIDOL (ISOVUE-300) INJECTION 61% COMPARISON:  Chest CT dated 10/16/2016. FINDINGS: Lower chest: Interval mild linear atelectasis or scarring at both lung bases. Hepatobiliary: No focal liver abnormality is seen. No gallstones, gallbladder wall thickening, or biliary dilatation. Pancreas: Unremarkable. No pancreatic ductal dilatation or surrounding inflammatory changes. Spleen: Normal in size without focal abnormality. Adrenals/Urinary Tract: The previously demonstrated 2.4 x 2.3 cm low density left adrenal mass currently measures 2.6 x 2.5 cm. Normal appearing right adrenal gland. 9 mm mid right renal cyst and additional tiny right renal cysts. Tiny lower pole right renal calculus. Multiple small left renal calculi, the largest measuring 7 mm in maximum diameter, in the lower pole . No bladder or ureteral calculi seen. No hydronephrosis. Stomach/Bowel: Stomach is within normal limits. Appendix appears normal. No evidence of bowel wall thickening, distention, or inflammatory changes. Vascular/Lymphatic: No significant vascular findings are present. No enlarged abdominal or pelvic lymph nodes. Reproductive: Mildly enlarged prostate gland. Other: Small proximal left inguinal hernia containing fat. Small umbilical hernia containing fat.  Musculoskeletal: Mild lumbar and lower thoracic spine degenerative changes. IMPRESSION: 1. No acute abnormality. 2. Stable left adrenal adenoma. 3. Small, nonobstructing bilateral renal calculi. 4. Mild prostatic hypertrophy. Electronically Signed   By: Claudie Revering M.D.   On: 07/29/2018 17:04     Labs:   Basic Metabolic Panel: Recent Labs  Lab 07/28/18 1450 07/29/18 1718 07/30/18 0629 07/31/18 0718  08/01/18 0539  NA 136 132* 134* 136 136  K 4.8 4.3 3.3* 3.9 4.2  CL 97 96* 103 104 103  CO2 29 21* 24 24 25   GLUCOSE 94 117* 108* 120* 126*  BUN 21 16 13 16 18   CREATININE 0.78 0.70 0.77 0.63 0.69  CALCIUM 8.6 8.5* 7.7* 8.5* 8.4*   GFR Estimated Creatinine Clearance: 102 mL/min (by C-G formula based on SCr of 0.69 mg/dL). Liver Function Tests: Recent Labs  Lab 07/28/18 1450 07/29/18 1718 07/30/18 0629 07/31/18 0718 08/01/18 0539  AST 227* 224* 96* 60* 135*  ALT 231* 354* 221* 202* 291*  ALKPHOS 266* 237* 188* 187* 156*  BILITOT 0.3 0.7 0.4 0.5 0.4  PROT 6.0 6.8 5.1* 6.0* 5.8*  ALBUMIN 2.7* 2.2* 1.8* 2.0* 2.0*   No results for input(s): LIPASE, AMYLASE in the last 168 hours. No results for input(s): AMMONIA in the last 168 hours. Coagulation profile No results for input(s): INR, PROTIME in the last 168 hours.  CBC: Recent Labs  Lab 07/28/18 1450 07/29/18 1718 07/30/18 0629 07/31/18 0718 08/01/18 0539  WBC 17.1*  17.5* 22.7* 18.7* 18.2* 18.4*  NEUTROABS 14,501*  15.3* 20.2* 14.3*  --   --   HGB 11.6*  11.6* 11.9* 9.8* 11.1* 10.3*  HCT 34.4*  35.8* 38.0* 31.3* 35.0* 33.3*  MCV 81.7  85.1 87.6 88.4 88.2 88.6  PLT 684*  691.0* 746* 566* 688* 760*   Cardiac Enzymes: Recent Labs  Lab 07/29/18 2138  CKTOTAL 10*   BNP: Invalid input(s): POCBNP CBG: No results for input(s): GLUCAP in the last 168 hours. D-Dimer No results for input(s): DDIMER in the last 72 hours. Hgb A1c No results for input(s): HGBA1C in the last 72 hours. Lipid Profile No results for input(s): CHOL, HDL, LDLCALC, TRIG, CHOLHDL, LDLDIRECT in the last 72 hours. Thyroid function studies No results for input(s): TSH, T4TOTAL, T3FREE, THYROIDAB in the last 72 hours.  Invalid input(s): FREET3 Anemia work up Recent Labs    07/30/18 [place]*   Microbiology Recent Results (from the past 240 hour(s))  Blood culture (routine x 2)     Status: None (Preliminary result)    Collection Time: 07/29/18  5:00 PM  Result Value Ref Range Status   Specimen Description BLOOD RIGHT ANTECUBITAL  Final   Special Requests   Final    BOTTLES DRAWN AEROBIC AND ANAEROBIC Blood Culture results may not be optimal due to an excessive volume of blood received in culture bottles   Culture   Final    NO GROWTH 3 DAYS Performed at [place] [address] [place], [place] [address]    Report Status PENDING  Incomplete  Blood culture (routine x 2)     Status: None (Preliminary result)   Collection Time: 07/29/18  8:31 PM  Result Value Ref Range Status   Specimen Description BLOOD RIGHT FOREARM  Final   Special Requests   Final    BOTTLES DRAWN AEROBIC AND ANAEROBIC Blood Culture results may not be optimal due to an inadequate volume of blood received in culture bottles  Culture   Final    NO GROWTH 3 DAYS Performed at [facility], [place] [address], [place], [place] [address]    Report Status PENDING  Incomplete  Urine culture     Status: None   Collection Time: 07/30/18  1:40 AM  Result Value Ref Range Status   Specimen Description URINE, CATHETERIZED  Final   Special Requests NONE  Final   Culture   Final    NO GROWTH Performed at [facility], [place] [address] [place], [place] [address]    Report Status 07/31/2018 FINAL  Final  Culture, blood (Routine X 2) w Reflex to ID Panel     Status: None (Preliminary result)   Collection Time: 07/30/18  3:06 PM  Result Value Ref Range Status   Specimen Description BLOOD RIGHT ANTECUBITAL  Final   Special Requests   Final    BOTTLES DRAWN AEROBIC ONLY Blood Culture adequate volume   Culture   Final    NO GROWTH 2 DAYS Performed at [facility], [place] [address] [place], [place] [address]    Report Status PENDING  Incomplete  Culture, blood (routine x 2)     Status: None (Preliminary result)   Collection Time: 07/31/18  7:18 AM  Result Value Ref Range Status   Specimen Description BLOOD RIGHT  ANTECUBITAL  Final   Special Requests   Final    BOTTLES DRAWN AEROBIC ONLY Blood Culture adequate volume   Culture   Final    NO GROWTH 1 DAY Performed at [facility], [place] [address], [place], [place] [address]    Report Status PENDING  Incomplete  Culture, blood (routine x 2)     Status: None (Preliminary result)   Collection Time: 07/31/18  7:18 AM  Result Value Ref Range Status   Specimen Description BLOOD LEFT HAND  Final   Special Requests   Final    BOTTLES DRAWN AEROBIC ONLY Blood Culture adequate volume   Culture   Final    NO GROWTH 1 DAY Performed at [facility], [place] [address] [place], [place] [address]    Report Status PENDING  Incomplete     Discharge Instructions:   Discharge Instructions    Diet - low sodium heart healthy   Complete by:  As directed    Discharge instructions   Complete by:  As directed    Patient has appointment at [facility] on [facility]/1 [place]-- arrive 15 min early   Increase activity slowly   Complete by:  As directed      Allergies as of 08/01/2018      Reactions   Simvastatin Other (See Comments)   myalgias   Atorvastatin Other (See Comments)   Joint pain and low back pain      Medication List    STOP taking these medications   meloxicam 7.5 MG tablet Commonly known as:  MOBIC   ranitidine 300 MG tablet Commonly known as:  ZANTAC   rosuvastatin 10 MG tablet Commonly known as:  CRESTOR   Vitamin D (Ergocalciferol) 50000 units Caps capsule Commonly known as:  DRISDOL     TAKE these medications   b complex vitamins capsule Take 1 capsule by mouth daily.   fluticasone 50 MCG/ACT nasal spray Commonly known as:  FLONASE Place 2 sprays into both nostrils daily.   ibuprofen 200 MG tablet Commonly known as:  ADVIL,MOTRIN Take 400-600 mg by mouth every 6 (  six) hours as needed for fever.   levothyroxine 25 MCG tablet Commonly known as:  SYNTHROID, LEVOTHROID Take 1 tablet (25 mcg total) by mouth  daily before breakfast.   metoprolol tartrate 25 MG tablet Commonly known as:  LOPRESSOR TAKE 1 TABLET BY MOUTH TWICE A DAY   omega-3 acid ethyl esters 1 g capsule Commonly known as:  LOVAZA Take 1 g by mouth daily.   pantoprazole 40 MG tablet Commonly known as:  PROTONIX Take 1 tablet (40 mg total) by mouth 2 (two) times daily.   predniSONE 20 MG tablet Commonly known as:  DELTASONE Take 2 tablets (40 mg total) by mouth 2 (two) times daily with a meal. What changed:    how much to take  how to take this  when to take this  additional instructions   QUNOL COQ10/UBIQUINOL/MEGA 100 MG Caps Generic drug:  Ubiquinol Take 1 capsule by mouth daily.      Follow-up Information    Mosie Lukes, MD Follow up in 1 week(s).   Specialty:  Family Medicine Contact information: [address] [place] [address] [phone]        Norberto Sorenson at [address],[facility] at 1 PM-arrive 15 minutes prior Follow up.            Time coordinating discharge: 35 min  Signed:  Geradine Girt  [facility] 08/01/2018, 2:24 PM

## 2018-08-02 ENCOUNTER — Telehealth: Payer: Self-pay | Admitting: Family Medicine

## 2018-08-02 LAB — ANCA TITERS
Atypical P-ANCA titer: <1:20 {titer}
P-ANCA: <1:20 {titer}

## 2018-08-02 LAB — HUMAN PARVOVIRUS DNA DETECTION BY PCR: Parvovirus B19, PCR: NEGATIVE

## 2018-08-02 NOTE — Telephone Encounter (Signed)
Copied from [place] [id number]. Topic: Quick Communication - See Telephone Encounter >> Aug 02, 2018 12:38 PM Berneta Levins wrote: CRM for notification. See Telephone encounter for: 08/02/18.  Pt calling.  States that he was released from [facility] on 09/30.  Pt states that the hospital set him up for a rheumatologist [facility]).  Pt states that the person they set him up with was a PA Marella Chimes).  Pt states that he hasn't had much luck with PAs and wants to know if Dr. Charlett Blake would recommend he see the PA now since a physician [facility] - Dr. Annabelle Harman) can't see him for another month. Pt can be reached at [phone]

## 2018-08-02 NOTE — Telephone Encounter (Signed)
Pt. Seeking Dr. Frederik Pear advice.

## 2018-08-02 NOTE — Telephone Encounter (Signed)
Yes he should see the PA for now we can switch to MD later but best to get back in the door. To help manage his current state

## 2018-08-02 NOTE — Telephone Encounter (Signed)
Spoke with patient he stated he missed the appt today with [place] however he will call and make another appt. He also states he will see a rheumatologist on 10/21 within [facility]

## 2018-08-03 ENCOUNTER — Telehealth: Payer: Self-pay | Admitting: Family Medicine

## 2018-08-03 LAB — CULTURE, BLOOD (ROUTINE X 2)
Culture: NO GROWTH
Culture: NO GROWTH

## 2018-08-03 NOTE — Telephone Encounter (Signed)
Copied from Tyler Schmidt [id number]. Topic: Appointment Scheduling - Scheduling Inquiry for Clinic >> Aug 03, 2018  2:47 PM Cecelia Byars, NT wrote: Reason for CRM: Patient is requesting to change his hosp /fu to 08/1018  AT 11:30 he says it ok to leave to a message, he is unable to make his current appointment due to his schedule

## 2018-08-04 LAB — CULTURE, BLOOD (ROUTINE X 2)
CULTURE: NO GROWTH
SPECIAL REQUESTS: ADEQUATE

## 2018-08-05 LAB — CULTURE, BLOOD (ROUTINE X 2)
Culture: NO GROWTH
Culture: NO GROWTH
SPECIAL REQUESTS: ADEQUATE
SPECIAL REQUESTS: ADEQUATE

## 2018-08-07 ENCOUNTER — Other Ambulatory Visit: Payer: Self-pay | Admitting: Family Medicine

## 2018-08-09 ENCOUNTER — Encounter: Payer: Self-pay | Admitting: Family Medicine

## 2018-08-09 ENCOUNTER — Ambulatory Visit: Payer: BLUE CROSS/BLUE SHIELD | Admitting: Family Medicine

## 2018-08-09 ENCOUNTER — Inpatient Hospital Stay: Payer: BLUE CROSS/BLUE SHIELD | Admitting: Family Medicine

## 2018-08-09 ENCOUNTER — Other Ambulatory Visit: Payer: BLUE CROSS/BLUE SHIELD

## 2018-08-09 VITALS — BP 118/72 | HR 63 | Temp 98.0°F | Resp 18 | Wt 141.2 lb

## 2018-08-09 DIAGNOSIS — M0579 Rheumatoid arthritis with rheumatoid factor of multiple sites without organ or systems involvement: Secondary | ICD-10-CM | POA: Diagnosis not present

## 2018-08-09 DIAGNOSIS — R7401 Elevation of levels of liver transaminase levels: Secondary | ICD-10-CM

## 2018-08-09 DIAGNOSIS — R05 Cough: Secondary | ICD-10-CM | POA: Diagnosis not present

## 2018-08-09 DIAGNOSIS — R6510 Systemic inflammatory response syndrome (SIRS) of non-infectious origin without acute organ dysfunction: Secondary | ICD-10-CM | POA: Diagnosis not present

## 2018-08-09 DIAGNOSIS — R945 Abnormal results of liver function studies: Secondary | ICD-10-CM

## 2018-08-09 DIAGNOSIS — D72829 Elevated white blood cell count, unspecified: Secondary | ICD-10-CM | POA: Diagnosis not present

## 2018-08-09 DIAGNOSIS — R740 Nonspecific elevation of levels of transaminase and lactic acid dehydrogenase [LDH]: Secondary | ICD-10-CM

## 2018-08-09 DIAGNOSIS — R768 Other specified abnormal immunological findings in serum: Secondary | ICD-10-CM | POA: Diagnosis not present

## 2018-08-09 DIAGNOSIS — R29898 Other symptoms and signs involving the musculoskeletal system: Secondary | ICD-10-CM | POA: Diagnosis not present

## 2018-08-09 DIAGNOSIS — D729 Disorder of white blood cells, unspecified: Secondary | ICD-10-CM | POA: Diagnosis not present

## 2018-08-09 DIAGNOSIS — D473 Essential (hemorrhagic) thrombocythemia: Secondary | ICD-10-CM

## 2018-08-09 DIAGNOSIS — D75839 Thrombocytosis, unspecified: Secondary | ICD-10-CM

## 2018-08-09 DIAGNOSIS — R7989 Other specified abnormal findings of blood chemistry: Secondary | ICD-10-CM

## 2018-08-09 LAB — CBC WITH DIFFERENTIAL/PLATELET
BASOS ABS: 0 10*3/uL (ref 0.0–0.1)
Basophils Relative: 0.3 % (ref 0.0–3.0)
Eosinophils Absolute: 0 10*3/uL (ref 0.0–0.7)
Eosinophils Relative: 0 % (ref 0.0–5.0)
HCT: 36.7 % — ABNORMAL LOW (ref 39.0–52.0)
HEMOGLOBIN: 12 g/dL — AB (ref 13.0–17.0)
Lymphocytes Relative: 10.7 % — ABNORMAL LOW (ref 12.0–46.0)
Lymphs Abs: 1.5 10*3/uL (ref 0.7–4.0)
MCHC: 32.8 g/dL (ref 30.0–36.0)
MCV: 85.2 fl (ref 78.0–100.0)
MONO ABS: 0.4 10*3/uL (ref 0.1–1.0)
Monocytes Relative: 3 % (ref 3.0–12.0)
Neutro Abs: 12.1 10*3/uL — ABNORMAL HIGH (ref 1.4–7.7)
Neutrophils Relative %: 86 % — ABNORMAL HIGH (ref 43.0–77.0)
Platelets: 723 10*3/uL — ABNORMAL HIGH (ref 150.0–400.0)
RBC: 4.3 Mil/uL (ref 4.22–5.81)
RDW: 17.8 % — ABNORMAL HIGH (ref 11.5–15.5)
WBC: 14.1 10*3/uL — AB (ref 4.0–10.5)

## 2018-08-09 LAB — COMPREHENSIVE METABOLIC PANEL
ALBUMIN: 3.4 g/dL — AB (ref 3.5–5.2)
ALK PHOS: 102 U/L (ref 39–117)
ALT: 76 U/L — ABNORMAL HIGH (ref 0–53)
AST: 16 U/L (ref 0–37)
BILIRUBIN TOTAL: 0.4 mg/dL (ref 0.2–1.2)
BUN: 23 mg/dL (ref 6–23)
CO2: 31 mEq/L (ref 19–32)
CREATININE: 0.75 mg/dL (ref 0.40–1.50)
Calcium: 9.8 mg/dL (ref 8.4–10.5)
Chloride: 94 mEq/L — ABNORMAL LOW (ref 96–112)
GFR: 116.9 mL/min (ref >60.00)
Glucose, Bld: 93 mg/dL (ref 70–99)
POTASSIUM: 4.9 meq/L (ref 3.5–5.1)
SODIUM: 133 meq/L — AB (ref 135–145)
TOTAL PROTEIN: 6.5 g/dL (ref 6.0–8.3)

## 2018-08-09 MED ORDER — PANTOPRAZOLE SODIUM 20 MG PO TBEC: 20.0000 mg | DELAYED_RELEASE_TABLET | Freq: Every day | ORAL | 3 refills | Status: DC

## 2018-08-09 NOTE — Patient Instructions (Addendum)
Check with Rheumatology about the Prednisone and flu shot. If the prednisone will be done prior to December we can wait a bit to give the flu shot. If not can return to get nurse visit for flu shot.  Melatonin 2-10 mg qhs can go up to 20 mg as needed and as tolerated  Zinc 50 mg, Elderberry, vitamin C 500 to 1000mg  daily to help such as Cira Rue, Coldeeze,  Echinacea is fine NOW compnay  Insomnia Insomnia is a sleep disorder that makes it difficult to fall asleep or to stay asleep. Insomnia can cause tiredness (fatigue), low energy, difficulty concentrating, mood swings, and poor performance at work or school. There are three different ways to classify insomnia:  Difficulty falling asleep.  Difficulty staying asleep.  Waking up too early in the morning.  Any type of insomnia can be long-term (chronic) or short-term (acute). Both are common. Short-term insomnia usually lasts for three months or less. Chronic insomnia occurs at least three times a week for longer than three months. What are the causes? Insomnia may be caused by another condition, situation, or substance, such as:  Anxiety.  Certain medicines.  Gastroesophageal reflux disease (GERD) or other gastrointestinal conditions.  Asthma or other breathing conditions.  Restless legs syndrome, sleep apnea, or other sleep disorders.  Chronic pain.  Menopause. This may include hot flashes.  Stroke.  Abuse of alcohol, tobacco, or illegal drugs.  Depression.  Caffeine.  Neurological disorders, such as Alzheimer disease.  An overactive thyroid (hyperthyroidism).  The cause of insomnia may not be known. What increases the risk? Risk factors for insomnia include:  Gender. Women are more commonly affected than men.  Age. Insomnia is more common as you get older.  Stress. This may involve your professional or personal life.  Income. Insomnia is more common in people with lower income.  Lack of  exercise.  Irregular work schedule or night shifts.  Traveling between different time zones.  What are the signs or symptoms? If you have insomnia, trouble falling asleep or trouble staying asleep is the main symptom. This may lead to other symptoms, such as:  Feeling fatigued.  Feeling nervous about going to sleep.  Not feeling rested in the morning.  Having trouble concentrating.  Feeling irritable, anxious, or depressed.  How is this treated? Treatment for insomnia depends on the cause. If your insomnia is caused by an underlying condition, treatment will focus on addressing the condition. Treatment may also include:  Medicines to help you sleep.  Counseling or therapy.  Lifestyle adjustments.  Follow these instructions at home:  Take medicines only as directed by your health care provider.  Keep regular sleeping and waking hours. Avoid naps.  Keep a sleep diary to help you and your health care provider figure out what could be causing your insomnia. Include: ? When you sleep. ? When you wake up during the night. ? How well you sleep. ? How rested you feel the next day. ? Any side effects of medicines you are taking. ? What you eat and drink.  Make your bedroom a comfortable place where it is easy to fall asleep: ? Put up shades or special blackout curtains to block light from outside. ? Use a white noise machine to block noise. ? Keep the temperature cool.  Exercise regularly as directed by your health care provider. Avoid exercising right before bedtime.  Use relaxation techniques to manage stress. Ask your health care provider to suggest some techniques that may work  well for you. These may include: ? Breathing exercises. ? Routines to release muscle tension. ? Visualizing peaceful scenes.  Cut back on alcohol, caffeinated beverages, and cigarettes, especially close to bedtime. These can disrupt your sleep.  Do not overeat or eat spicy foods right before  bedtime. This can lead to digestive discomfort that can make it hard for you to sleep.  Limit screen use before bedtime. This includes: ? Watching TV. ? Using your smartphone, tablet, and computer.  Stick to a routine. This can help you fall asleep faster. Try to do a quiet activity, brush your teeth, and go to bed at the same time each night.  Get out of bed if you are still awake after 15 minutes of trying to sleep. Keep the lights down, but try reading or doing a quiet activity. When you feel sleepy, go back to bed.  Make sure that you drive carefully. Avoid driving if you feel very sleepy.  Keep all follow-up appointments as directed by your health care provider. This is important. Contact a health care provider if:  You are tired throughout the day or have trouble in your daily routine due to sleepiness.  You continue to have sleep problems or your sleep problems get worse. Get help right away if:  You have serious thoughts about hurting yourself or someone else. This information is not intended to replace advice given to you by your health care provider. Make sure you discuss any questions you have with your health care provider. Document Released: 10/16/2000 Document Revised: 03/20/2016 Document Reviewed: 07/20/2014 Elsevier Interactive Patient Education  [employer].

## 2018-08-10 LAB — PATHOLOGIST SMEAR REVIEW

## 2018-08-11 ENCOUNTER — Other Ambulatory Visit: Payer: Self-pay | Admitting: Emergency Medicine

## 2018-08-11 DIAGNOSIS — D72829 Elevated white blood cell count, unspecified: Secondary | ICD-10-CM

## 2018-08-11 NOTE — Progress Notes (Signed)
COM

## 2018-08-14 NOTE — Assessment & Plan Note (Signed)
Has recently been released from the hospital on Prednisone and he feels much better and he will now have to titrate down slowly.

## 2018-08-14 NOTE — Assessment & Plan Note (Signed)
Improving will continue to monitor

## 2018-08-14 NOTE — Assessment & Plan Note (Signed)
Is improving since stating steroids.

## 2018-08-14 NOTE — Progress Notes (Signed)
Subjective:    Patient ID: Tyler Schmidt, male    DOB: February 13, 1966, 52 y.o.   MRN: [id number]  No chief complaint on file.   HPI Patient is in today for hospital follow up. Notes he feels much better since he was hospitalized and placed on high dose Prednisone at 80 mg daily. He notes his myalgiss, athralgias and weakness are all improved. He has cleaned up his diet and is gluten and dairy free and feels that is helping also. Has some mild dyspepsia also but overall is greatly improved. Denies CP/palp/SOB/HA/congestion/fevers or GU c/o. Taking meds as prescribed  Past Medical History:  Diagnosis Date  . Adrenal adenoma, left    Incidentaloma noted on noncontrast chest CT done to f/u lung nodule seen on chest radiograph (10/2016).  . Allergy   . Anxiety 08/12/2012  . Atypical chest pain Fall 2013   Stress echo NORMAL 08/30/12  . Barrett esophagus 2012  . GERD (gastroesophageal reflux disease)   . Hyperlipidemia    HDL high, LDL high: simvastatin resulted in prolonged/severe muscle pain, led to long w/u and pt wants to avoid further statin unless lipids severely worsen  . Neck pain 08/12/2012  . Nephrolithiasis 10/2016   Bilat, nonobstruct, noted on CT chest done for f/u of pulm nodule seen on chest radiograph  . Palpitations Fall/winter 2017   Improved with lopressor  . Subclinical hypothyroidism    Patient give hx of hypfunctioning goiter in the distant past, says he was given pills to "kill" the goiter.  Says bx of goiter was benign.    Past Surgical History:  Procedure Laterality Date  . 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 Doreatha Lew)  . CARDIOVASCULAR STRESS TEST  08/2012   Stress echo normal.  Pt also reports normal ETT 09/2016.  Marland Kitchen ESOPHAGOGASTRODUODENOSCOPY  09/2011   Dr. Olevia Perches  . HOLTER MONITOR  10/2016   No pathologic arrhythmias.  Occ PVCs that occ corresponded to his palpitations.      Family History  Problem  Relation Age of Onset  . Heart disease Father        Died of MI age 67, possible PE  . Heart disease Paternal Uncle        MI's in his 69s  . Heart disease Paternal Uncle   . Colon cancer Neg Hx     Social History   Socioeconomic History  . Marital status: Married    Spouse name: Not on file  . Number of children: 2  . Years of education: Not on file  . Highest education level: Not on file  Occupational History    Employer: [employer]    Comment: Pilot  Social Needs  . Financial resource strain: Not on file  . Food insecurity:    Worry: Not on file    Inability: Not on file  . Transportation needs:    Medical: Not on file    Non-medical: Not on file  Tobacco Use  . Smoking status: Former Smoker    Types: Cigarettes    Last attempt to quit: 08/18/1979    Years since quitting: 39.0  . Smokeless tobacco: Former Systems developer    Types: Chew  . Tobacco comment: has not used since age 35  Substance and Sexual Activity  . Alcohol use: No    Alcohol/week: 0.0 standard drinks  . Drug use: No  . Sexual activity: Yes  Lifestyle  . Physical activity:  Days per week: Not on file    Minutes per session: Not on file  . Stress: Not on file  Relationships  . Social connections:    Talks on phone: Not on file    Gets together: Not on file    Attends religious service: Not on file    Active member of club or organization: Not on file    Attends meetings of clubs or organizations: Not on file    Relationship status: Not on file  . Intimate partner violence:    Fear of current or ex partner: Not on file    Emotionally abused: Not on file    Physically abused: Not on file    Forced sexual activity: Not on file  Other Topics Concern  . Not on file  Social History Narrative   Married, boy/girl twins age 57yrs.   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   Originally from [place].   No T/A/Ds.   Cardiovasc exercise regularly.  Prudent diet.          Outpatient Medications Prior to Visit    Medication Sig Dispense Refill  . b complex vitamins capsule Take 1 capsule by mouth daily.    . folic acid (FOLVITE) 1 MG tablet   4  . levothyroxine (SYNTHROID, LEVOTHROID) 25 MCG tablet Take 1 tablet (25 mcg total) by mouth daily before breakfast. 30 tablet 3  . metoprolol tartrate (LOPRESSOR) 25 MG tablet TAKE 1 TABLET BY MOUTH TWICE A DAY 180 tablet 1  . omega-3 acid ethyl esters (LOVAZA) 1 g capsule Take 1 g by mouth daily.    . predniSONE (DELTASONE) 20 MG tablet Take 2 tablets (40 mg total) by mouth 2 (two) times daily with a meal. 60 tablet 0  . TURMERIC PO Take 2,000 mg by mouth daily.    Marland Kitchen Ubiquinol (QUNOL COQ10/UBIQUINOL/MEGA) 100 MG CAPS Take 1 capsule by mouth daily.    Marland Kitchen ibuprofen (ADVIL,MOTRIN) 200 MG tablet Take 400-600 mg by mouth every 6 (six) hours as needed for fever.    . pantoprazole (PROTONIX) 40 MG tablet Take 1 tablet (40 mg total) by mouth 2 (two) times daily. (Patient taking differently: Take 80 mg by mouth 2 (two) times daily. ) 60 tablet 0  . Vitamin D, Ergocalciferol, (DRISDOL) 50000 units CAPS capsule TAKE 1 CAPSULE (50,000 UNITS TOTAL) BY MOUTH EVERY 7 (SEVEN) DAYS. 12 capsule 0  . fluticasone (FLONASE) 50 MCG/ACT nasal spray Place 2 sprays into both nostrils daily. (Patient not taking: Reported on 07/29/2018) 16 g 6  . predniSONE (DELTASONE) 10 MG tablet   0   No facility-administered medications prior to visit.     Allergies  Allergen Reactions  . Simvastatin Other (See Comments)    myalgias  . Atorvastatin Other (See Comments)    Joint pain and low back pain    Review of Systems  Constitutional: Positive for malaise/fatigue. Negative for fever.  HENT: Negative for congestion.   Eyes: Negative for blurred vision.  Respiratory: Negative for cough.   Cardiovascular: Negative for chest pain and palpitations.  Gastrointestinal: Positive for heartburn. Negative for vomiting.  Musculoskeletal: Negative for back pain.  Skin: Negative for rash.   Neurological: Negative for loss of consciousness and headaches.       Objective:    Physical Exam  Constitutional: He is oriented to person, place, and time. He appears well-developed and well-nourished. No distress.  HENT:  Head: Normocephalic and atraumatic.  Nose: Nose normal.  Eyes: Right eye exhibits  no discharge. Left eye exhibits no discharge.  Neck: Normal range of motion. Neck supple.  Cardiovascular: Normal rate and regular rhythm.  No murmur heard. Pulmonary/Chest: Effort normal and breath sounds normal.  Abdominal: Soft. Bowel sounds are normal. There is no tenderness.  Musculoskeletal: He exhibits no edema.  Neurological: He is alert and oriented to person, place, and time.  Skin: Skin is warm and dry.  Psychiatric: He has a normal mood and affect.  Nursing note and vitals reviewed.   BP 118/72 (BP Location: Left Arm, Patient Position: Sitting, Cuff Size: Normal)   Pulse 63   Temp 98 F (36.7 C) (Oral)   Resp 18   Wt 141 lb 3.2 oz (64 kg)   SpO2 98%   BMI 20.26 kg/m  Wt Readings from Last 3 Encounters:  08/09/18 141 lb 3.2 oz (64 kg)  08/01/18 143 lb 15.4 oz (65.3 kg)  07/01/18 158 lb (71.7 kg)     Lab Results  Component Value Date   WBC 14.1 (H) 08/09/2018   HGB 12.0 (L) 08/09/2018   HCT 36.7 (L) 08/09/2018   PLT 723.0 (H) 08/09/2018   GLUCOSE 93 08/09/2018   CHOL 243 (H) 12/17/2016   TRIG 280.0 (H) 12/17/2016   HDL 39.50 12/17/2016   LDLDIRECT 172.0 12/17/2016   ALT 76 (H) 08/09/2018   AST 16 08/09/2018   NA 133 (L) 08/09/2018   K 4.9 08/09/2018   CL 94 (L) 08/09/2018   CREATININE 0.75 08/09/2018   BUN 23 08/09/2018   CO2 31 08/09/2018   TSH 3.01 07/01/2018    Lab Results  Component Value Date   TSH 3.01 07/01/2018   Lab Results  Component Value Date   WBC 14.1 (H) 08/09/2018   HGB 12.0 (L) 08/09/2018   HCT 36.7 (L) 08/09/2018   MCV 85.2 08/09/2018   PLT 723.0 (H) 08/09/2018   Lab Results  Component Value Date   NA 133 (L)  08/09/2018   K 4.9 08/09/2018   CO2 31 08/09/2018   GLUCOSE 93 08/09/2018   BUN 23 08/09/2018   CREATININE 0.75 08/09/2018   BILITOT 0.4 08/09/2018   ALKPHOS 102 08/09/2018   AST 16 08/09/2018   ALT 76 (H) 08/09/2018   PROT 6.5 08/09/2018   ALBUMIN 3.4 (L) 08/09/2018   CALCIUM 9.8 08/09/2018   ANIONGAP 8 08/01/2018   GFR 116.90 08/09/2018   Lab Results  Component Value Date   CHOL 243 (H) 12/17/2016   Lab Results  Component Value Date   HDL 39.50 12/17/2016   No results found for: [facility] Lab Results  Component Value Date   TRIG 280.0 (H) 12/17/2016   Lab Results  Component Value Date   CHOLHDL 6 12/17/2016   No results found for: HGBA1C     Assessment & Plan:   Problem List Items Addressed This Visit    Abnormal TSH    Is now on low dose levothyroxine and tolerating it well.      SIRS (systemic inflammatory response syndrome) (HCC)    Has recently been released from the hospital on Prednisone and he feels much better and he will now have to titrate down slowly.       Rheumatoid arthritis ([place])    Has had a severe onset. Has seen [place] rheumatology and awaits a second opinion at [facility] soon.       Elevated transaminase level    Is improving since stating steroids.        Other Visit Diagnoses  Leukocytosis, unspecified type    -  Primary   Relevant Orders   CBC w/Diff (Completed)   Elevated liver function tests       Relevant Orders   Comprehensive metabolic panel (Completed)      I have discontinued [place] "Jon"'s fluticasone, ibuprofen, pantoprazole, and Vitamin D (Ergocalciferol). I am also having him start on pantoprazole. Additionally, I am having him maintain his Ubiquinol, metoprolol tartrate, levothyroxine, omega-3 acid ethyl esters, b complex vitamins, predniSONE, folic acid, and TURMERIC PO.  Meds ordered this encounter  Medications  . pantoprazole (PROTONIX) 20 MG tablet    Sig: Take 1-2 tablets (20-40 mg total) by mouth  daily.    Dispense:  60 tablet    Refill:  3     Penni Homans, MD

## 2018-08-14 NOTE — Assessment & Plan Note (Signed)
Has had a severe onset. Has seen [place] rheumatology and awaits a second opinion at [facility] soon.

## 2018-08-14 NOTE — Assessment & Plan Note (Signed)
Is now on low dose levothyroxine and tolerating it well.

## 2018-08-15 ENCOUNTER — Encounter: Payer: Self-pay | Admitting: Gastroenterology

## 2018-08-17 ENCOUNTER — Other Ambulatory Visit: Payer: Self-pay | Admitting: Family Medicine

## 2018-08-19 ENCOUNTER — Ambulatory Visit: Payer: BLUE CROSS/BLUE SHIELD | Admitting: Family Medicine

## 2018-08-22 ENCOUNTER — Encounter: Payer: BLUE CROSS/BLUE SHIELD | Admitting: Gastroenterology

## 2018-08-22 DIAGNOSIS — M0579 Rheumatoid arthritis with rheumatoid factor of multiple sites without organ or systems involvement: Secondary | ICD-10-CM | POA: Diagnosis not present

## 2018-08-26 ENCOUNTER — Telehealth: Payer: Self-pay | Admitting: Family Medicine

## 2018-08-26 NOTE — Telephone Encounter (Signed)
Author phoned pt. Re: flu shot. Pt. expressed concern over getting flu shot while on prednisone, and best time to receive it as he is going to be travelling for 4 days next week. Author informed pt. that being on prednisone was not a contraindication to getting flu shot, but that even if he received it now, his antibodies would not be fully developed until about 2 weeks after injection, well past his return home from travelling. Pt. verbalized understanding, and decided to get flu shot at his PCP visit on 11/11, when he will be on a smaller dose of prednisone at that time. No other concerns.

## 2018-08-26 NOTE — Telephone Encounter (Signed)
Copied from [place] [id number]. Topic: Quick Communication - See Telephone Encounter >> Aug 26, 2018  3:50 PM Vernona Rieger wrote: CRM for notification. See Telephone encounter for: 08/26/18.  Patient said he has been on predniSONE (DELTASONE) 80 MG tablet taking one a day, he is down to 50 mg and next week he will be on 40 mg. He said he is decreasing it and wants to know does he need to get a flu shot, he would need to get it this weekend because he is going to leave for [facility] on Tuesday. He states that he would like to get it if that is okay with her and wants to make sure he has no reaction to it before getting on a plane. He would like a nurse to call him back today before office closes.

## 2018-09-08 ENCOUNTER — Telehealth: Payer: Self-pay

## 2018-09-08 DIAGNOSIS — E038 Other specified hypothyroidism: Secondary | ICD-10-CM

## 2018-09-08 DIAGNOSIS — E039 Hypothyroidism, unspecified: Secondary | ICD-10-CM

## 2018-09-08 NOTE — Telephone Encounter (Signed)
Copied from [place] [id number]. Topic: General - Other >> Sep 08, 2018  4:25 PM Yvette Rack wrote: Reason for CRM: Pt states he is on Prednisone and he was advised that he should take the flu shot when he done or on a low dosage. Pt states he will be on 20 mg of Prednisone next week and 10 mg the following week. Pt states he will remain on 10 mg of Prednisone until February. Pt would like to know when Dr. Charlett Blake would like him to have flu vaccine. Pt also would like to know when she would like for him to have the labs drawn. Pt did not want to schedule appt because he was unsure of when she preferred him to have the labs drawn. Pt requests a call back.

## 2018-09-08 NOTE — Telephone Encounter (Signed)
Have him come in on the 10 mg dose any time day 2 or later for the flu shot

## 2018-09-08 NOTE — Telephone Encounter (Signed)
Please advise 

## 2018-09-09 NOTE — Telephone Encounter (Signed)
Notified pt of below. States he will call back to schedule repeat lab appt (see future orders) when he gets his schedule next week. He is also asking to have his TSH level checked as he states he has been on the thyroid medication for 3 months now and though we were going to recheck it at 3 month intervals?  Please advise if ok to add to future lab order?

## 2018-09-11 NOTE — Telephone Encounter (Signed)
Please schedule labs for end of November. With TSH

## 2018-09-12 ENCOUNTER — Encounter: Payer: BLUE CROSS/BLUE SHIELD | Admitting: Family Medicine

## 2018-09-12 NOTE — Telephone Encounter (Signed)
TSH order added and will await pt to return call to schedule lab appt as stated below.

## 2018-09-13 ENCOUNTER — Other Ambulatory Visit: Payer: Self-pay | Admitting: Family Medicine

## 2018-09-19 ENCOUNTER — Other Ambulatory Visit: Payer: Self-pay | Admitting: Family Medicine

## 2018-09-19 ENCOUNTER — Telehealth: Payer: Self-pay | Admitting: Family Medicine

## 2018-09-19 DIAGNOSIS — E559 Vitamin D deficiency, unspecified: Secondary | ICD-10-CM | POA: Diagnosis not present

## 2018-09-19 DIAGNOSIS — R202 Paresthesia of skin: Secondary | ICD-10-CM | POA: Diagnosis not present

## 2018-09-19 DIAGNOSIS — M069 Rheumatoid arthritis, unspecified: Secondary | ICD-10-CM | POA: Diagnosis not present

## 2018-09-19 DIAGNOSIS — E538 Deficiency of other specified B group vitamins: Secondary | ICD-10-CM | POA: Diagnosis not present

## 2018-09-19 DIAGNOSIS — Z79899 Other long term (current) drug therapy: Secondary | ICD-10-CM | POA: Diagnosis not present

## 2018-09-19 NOTE — Telephone Encounter (Signed)
Copied from [place] [id number]. Topic: General - Other >> Sep 19, 2018  5:12 PM Cecelia Byars, NT wrote: Reason for CRM: Patient called and would like to know if he can get a tetanus shot with the ,  flu shot while being on prednisone .He has an appointment at 330  09/20/18, he will fax a request for blood work for his rheumatologist .Dr Lurena Joiner at [facility] . He would have it done at the same as his other labs due to cost ., please call him at [phone]

## 2018-09-20 ENCOUNTER — Other Ambulatory Visit (INDEPENDENT_AMBULATORY_CARE_PROVIDER_SITE_OTHER): Payer: BLUE CROSS/BLUE SHIELD

## 2018-09-20 ENCOUNTER — Ambulatory Visit (INDEPENDENT_AMBULATORY_CARE_PROVIDER_SITE_OTHER): Payer: BLUE CROSS/BLUE SHIELD

## 2018-09-20 DIAGNOSIS — E039 Hypothyroidism, unspecified: Secondary | ICD-10-CM | POA: Diagnosis not present

## 2018-09-20 DIAGNOSIS — E038 Other specified hypothyroidism: Secondary | ICD-10-CM

## 2018-09-20 DIAGNOSIS — D72829 Elevated white blood cell count, unspecified: Secondary | ICD-10-CM

## 2018-09-20 DIAGNOSIS — Z23 Encounter for immunization: Secondary | ICD-10-CM

## 2018-09-20 NOTE — Telephone Encounter (Signed)
Please advise 

## 2018-09-20 NOTE — Telephone Encounter (Signed)
He can have the Tdap at same time as flu shot and OK to add labs if we can figure out what they need quickly have not seen anything from [facility] yet.

## 2018-09-20 NOTE — Telephone Encounter (Signed)
Patient came into the office with paper lab orders from Dr. Rennis Chris office. Requesting labs drawn today at PCP lab for cost. Awaiting Dr. Frederik Pear approval.

## 2018-09-20 NOTE — Telephone Encounter (Signed)
Patient was seen in the lab  Tdap was not given    [place] Can you call patient and see if he needs his tdap and flu shot? And if so schedule him for them thanks  pc

## 2018-09-21 ENCOUNTER — Other Ambulatory Visit: Payer: Self-pay | Admitting: Family Medicine

## 2018-09-21 LAB — COMPREHENSIVE METABOLIC PANEL
ALBUMIN: 3.7 g/dL (ref 3.5–5.2)
ALK PHOS: 55 U/L (ref 39–117)
ALT: 22 U/L (ref 0–53)
AST: 12 U/L (ref 0–37)
BILIRUBIN TOTAL: 0.8 mg/dL (ref 0.2–1.2)
BUN: 16 mg/dL (ref 6–23)
CALCIUM: 9.6 mg/dL (ref 8.4–10.5)
CO2: 29 mEq/L (ref 19–32)
CREATININE: 0.94 mg/dL (ref 0.40–1.50)
Chloride: 99 mEq/L (ref 96–112)
GFR: 90.04 mL/min (ref >60.00)
Glucose, Bld: 95 mg/dL (ref 70–99)
Potassium: 4.9 mEq/L (ref 3.5–5.1)
Sodium: 136 mEq/L (ref 135–145)
TOTAL PROTEIN: 6.5 g/dL (ref 6.0–8.3)

## 2018-09-21 LAB — CBC WITH DIFFERENTIAL/PLATELET
BASOS PCT: 0.4 % (ref 0.0–3.0)
Basophils Absolute: 0.1 10*3/uL (ref 0.0–0.1)
Eosinophils Absolute: 0.2 10*3/uL (ref 0.0–0.7)
Eosinophils Relative: 1.4 % (ref 0.0–5.0)
HEMATOCRIT: 40.9 % (ref 39.0–52.0)
Hemoglobin: 13.6 g/dL (ref 13.0–17.0)
Lymphocytes Relative: 8 % — ABNORMAL LOW (ref 12.0–46.0)
Lymphs Abs: 1.1 10*3/uL (ref 0.7–4.0)
MCHC: 33.3 g/dL (ref 30.0–36.0)
MCV: 83.7 fl (ref 78.0–100.0)
MONOS PCT: 5 % (ref 3.0–12.0)
Monocytes Absolute: 0.7 10*3/uL (ref 0.1–1.0)
Neutro Abs: 11.3 10*3/uL — ABNORMAL HIGH (ref 1.4–7.7)
Neutrophils Relative %: 85.2 % — ABNORMAL HIGH (ref 43.0–77.0)
Platelets: 324 10*3/uL (ref 150.0–400.0)
RBC: 4.88 Mil/uL (ref 4.22–5.81)
RDW: 19 % — AB (ref 11.5–15.5)
WBC: 13.2 10*3/uL — ABNORMAL HIGH (ref 4.0–10.5)

## 2018-09-21 LAB — TSH: TSH: 2.87 u[IU]/mL (ref 0.35–4.50)

## 2018-09-21 NOTE — Telephone Encounter (Signed)
Called patient to potentially schedule tdap and flu shot. No answer and no option to leave voicemail.

## 2018-09-22 ENCOUNTER — Other Ambulatory Visit: Payer: Self-pay

## 2018-09-22 DIAGNOSIS — D72829 Elevated white blood cell count, unspecified: Secondary | ICD-10-CM

## 2018-09-22 LAB — PROTEIN ELECTROPHORESIS, SERUM
Albumin ELP: 3.3 g/dL — ABNORMAL LOW (ref 3.8–4.8)
Alpha 1: 0.5 g/dL — ABNORMAL HIGH (ref 0.2–0.3)
Alpha 2: 0.8 g/dL (ref 0.5–0.9)
BETA 2: 0.4 g/dL (ref 0.2–0.5)
Beta Globulin: 0.5 g/dL (ref 0.4–0.6)
GAMMA GLOBULIN: 0.8 g/dL (ref 0.8–1.7)
Total Protein: 6.3 g/dL (ref 6.1–8.1)

## 2018-10-12 ENCOUNTER — Ambulatory Visit: Payer: BLUE CROSS/BLUE SHIELD | Admitting: Physician Assistant

## 2018-10-12 ENCOUNTER — Encounter: Payer: Self-pay | Admitting: Physician Assistant

## 2018-10-12 VITALS — BP 128/72 | HR 94 | Ht 71.0 in | Wt 162.2 lb

## 2018-10-12 DIAGNOSIS — R002 Palpitations: Secondary | ICD-10-CM | POA: Diagnosis not present

## 2018-10-12 DIAGNOSIS — M0520 Rheumatoid vasculitis with rheumatoid arthritis of unspecified site: Secondary | ICD-10-CM

## 2018-10-12 DIAGNOSIS — R0789 Other chest pain: Secondary | ICD-10-CM

## 2018-10-12 DIAGNOSIS — E785 Hyperlipidemia, unspecified: Secondary | ICD-10-CM

## 2018-10-12 MED ORDER — METOPROLOL TARTRATE 25 MG PO TABS: ORAL_TABLET | ORAL | 1 refills | Status: DC

## 2018-10-12 NOTE — Patient Instructions (Signed)
Medication Instructions:  Increase Metoprolol to 37.5 mg twice daily. If you need a refill on your cardiac medications before your next appointment, please call your pharmacy.   Lab work: None Ordered. If you have labs (blood work) drawn today and your tests are completely normal, you will receive your results only by: Marland Kitchen MyChart Message (if you have MyChart) OR . A paper copy in the mail If you have any lab test that is abnormal or we need to change your treatment, we will call you to review the results.  Testing/Procedures: None Ordered.  Follow-Up: At [facility], you and your health needs are our priority.  As part of our continuing mission to provide you with exceptional heart care, we have created designated Provider Care Teams.  These Care Teams include your primary Cardiologist (physician) and Advanced Practice Providers (APPs -  Physician Assistants and Nurse Practitioners) who all work together to provide you with the care you need, when you need it. You will need a follow up appointment in 4-6 months.  Please call our office 2 months in advance to schedule this appointment.  You may see Dr.Crenshaw or one of the following Advanced Practice Providers on your designated Care Team:   Kerin Ransom, PA-C Roby Lofts, [place] . Sande Rives, PA-C  Any Other Special Instructions Will Be Listed Below (If Applicable). Increase activity Call us with the dose of medication that you take at home per [facility].

## 2018-10-12 NOTE — Progress Notes (Signed)
Cardiology Office Note    Date:  10/14/2018   ID:  Tyler Schmidt, DOB [birth year], MRN [id number]  PCP:  Tyler Lukes, MD  Cardiologist:  Dr. Stanford Schmidt  Chief Complaint  Patient presents with  . Follow-up    found out he has autoimmune RA, medication he was placed on can increase heart disease, pt mentions racing heart and feels 'skipping' at times.     History of Present Illness:  Tyler Schmidt is a 52 y.o. male with past medical history of RA, hyperlipidemia, subclinical hypothyroidism and history of palpitation.  Patient had a stress echocardiogram in 2009 that was normal.  Stress echo in October 2013 was also normal.  Holter monitor in December 2017 shows sinus rhythm with PVCs.  He underwent another stress echo at [facility] in November 2017 that was normal.  Chest CT obtained in December 2017 showed coronary artery calcification.  Patient was last evaluated by Dr. Stanford Schmidt on 01/08/2017 for palpitation.  He described the sensation as a skipped beat.  His symptom have improved with metoprolol.  Patient presents today for cardiology office visit.  He was diagnosed with rheumatoid arthritis earlier this year.  X-rays did not show any erosions or ankylosis, there was mild degenerative changes in the radiocarpal, first MCP and third MCP joints.  He is currently taking Plaquenil Monday through Friday only.  He was also recently placed on Crestor by his primary care provider.  CT angiogram of chest and abdomen did not show any evidence of vasculitis.  A noncontrasted CT of the chest continue to show coronary calcification in the proximal LAD territory however no significant pulmonary disease.  He has been concerned of the systemic effect of rheumatoid arthritis on his heart as well.  Given the fact he is on the Plaquenil, I will check a EKG for QTC prolongation.  Recent lab work did not show any evidence of aplastic anemia on the Plaquenil.  He has been having some intermittent chest  tightness with emotional stress recently.  They do not occur with exertion and only last a few seconds at a time.  They are accompanied by occasional feeling of skipped heartbeat.  The symptom seems to be quite rare at this time.  I recommend observation at this time.  I will increase his metoprolol to 37.5 mg twice daily for better rate control.  I recommend him to increase activity level and reassess in 4 to 6 months.  He is aware that if his symptoms become more frequent or lasting longer, he will need to let us know.    Past Medical History:  Diagnosis Date  . Adrenal adenoma, left    Incidentaloma noted on noncontrast chest CT done to f/u lung nodule seen on chest radiograph (10/2016).  . Allergy   . Anxiety 08/12/2012  . Atypical chest pain Fall 2013   Stress echo NORMAL 08/30/12  . Barrett esophagus 2012  . GERD (gastroesophageal reflux disease)   . Hyperlipidemia    HDL high, LDL high: simvastatin resulted in prolonged/severe muscle pain, led to long w/u and pt wants to avoid further statin unless lipids severely worsen  . Neck pain 08/12/2012  . Nephrolithiasis 10/2016   Bilat, nonobstruct, noted on CT chest done for f/u of pulm nodule seen on chest radiograph  . Palpitations Fall/winter 2017   Improved with lopressor  . Subclinical hypothyroidism    Patient give hx of hypfunctioning goiter in the distant past, says he was given pills to "kill" the goiter.  Says bx of goiter was benign.    Past Surgical History:  Procedure Laterality Date  . 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 CARDIOVASCULAR STRESS TEST  08/2012   Stress echo normal.  Pt also reports normal ETT 09/2016.  Marland Kitchen ESOPHAGOGASTRODUODENOSCOPY  09/2011   Dr. Olevia Perches  . HOLTER MONITOR  10/2016   No pathologic arrhythmias.  Occ PVCs that occ corresponded to his palpitations.      Current Medications: Outpatient Medications Prior to Visit  Medication Sig  Dispense Refill  . b complex vitamins capsule Take 1 capsule by mouth daily.    . hydroxychloroquine (PLAQUENIL) 200 MG tablet Take by mouth.     . levothyroxine (SYNTHROID, LEVOTHROID) 25 MCG tablet TAKE 1 TABLET (25 MCG TOTAL) BY MOUTH DAILY BEFORE BREAKFAST. 30 tablet 3  . omega-3 acid ethyl esters (LOVAZA) 1 g capsule Take 1 g by mouth daily.    Marland Kitchen omeprazole (PRILOSEC) 20 MG capsule TAKE ONE CAPSULE BY MOUTH TWICE A DAY 180 capsule 1  . predniSONE (DELTASONE) 20 MG tablet Take 2 tablets (40 mg total) by mouth 2 (two) times daily with a meal. (Patient taking differently: Take 40 mg by mouth 2 (two) times daily with a meal. ) 60 tablet 0  . TURMERIC PO Take 2,000 mg by mouth daily.    . folic acid (FOLVITE) 1 MG tablet   4  . metoprolol tartrate (LOPRESSOR) 25 MG tablet TAKE 1 TABLET BY MOUTH TWICE A DAY 180 tablet 1  . pantoprazole (PROTONIX) 20 MG tablet Take 1-2 tablets (20-40 mg total) by mouth daily. 60 tablet 3  . Ubiquinol (QUNOL COQ10/UBIQUINOL/MEGA) 100 MG CAPS Take 1 capsule by mouth daily.     No facility-administered medications prior to visit.      Allergies:   Simvastatin and Atorvastatin   Social History   Socioeconomic History  . Marital status: Married    Spouse name: Not on file  . Number of children: 2  . Years of education: Not on file  . Highest education level: Not on file  Occupational History    Employer: [employer]    Comment: [employer]  Social Needs  . Financial resource strain: Not on file  . Food insecurity:    Worry: Not on file    Inability: Not on file  . Transportation needs:    Medical: Not on file    Non-medical: Not on file  Tobacco Use  . Smoking status: Former Smoker    Types: Cigarettes    Last attempt to quit: 08/18/1979    Years since quitting: 39.1  . Smokeless tobacco: Former Systems developer    Types: Chew  . Tobacco comment: has not used since age 87  Substance and Sexual Activity  . Alcohol use: No    Alcohol/week: 0.0 standard drinks  . Drug  use: No  . Sexual activity: Yes  Lifestyle  . Physical activity:    Days per week: Not on file    Minutes per session: Not on file  . Stress: Not on file  Relationships  . Social connections:    Talks on phone: Not on file    Gets together: Not on file    Attends religious service: Not on file    Active member of club or organization: Not on file    Attends meetings of clubs or organizations: Not on file    Relationship status: Not on file  Other Topics Concern  . Not on file  Social History Narrative   Married, boy/girl twins age 39yrs.   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   No T/A/Ds.   Cardiovasc exercise regularly.  Prudent diet.           Family History:  The patient's family history includes Heart disease in his father, paternal uncle, and paternal uncle.   ROS:   Please see the history of present illness.    ROS All other systems reviewed and are negative.   PHYSICAL EXAM:   VS:  BP 128/72 (BP Location: Left Arm, Patient Position: Sitting)   Pulse 94   Ht 5\' 11"  (1.803 m)   Wt 162 lb 3.2 oz (73.6 kg)   SpO2 95%   BMI 22.62 kg/m    GEN: Well nourished, well developed, in no acute distress  HEENT: normal  Neck: no JVD, carotid bruits, or masses Cardiac: RRR; no murmurs, rubs, or gallops,no edema  Respiratory:  clear to auscultation bilaterally, normal work of breathing GI: soft, nontender, nondistended, + BS MS: no deformity or atrophy  Skin: warm and dry, no rash Neuro:  Alert and Oriented x 3, Strength and sensation are intact Psych: euthymic mood, full affect  Wt Readings from Last 3 Encounters:  10/12/18 162 lb 3.2 oz (73.6 kg)  08/09/18 141 lb 3.2 oz (64 kg)  08/01/18 143 lb 15.4 oz (65.3 kg)      Studies/Labs Reviewed:   EKG:  EKG is ordered today.  The ekg ordered today demonstrates normal sinus rhythm without significant ST-T wave changes.  Recent Labs: 09/20/2018: ALT 22; BUN 16; Creatinine, Ser 0.94; Hemoglobin  13.6; Platelets 324.0; Potassium 4.9; Sodium 136; TSH 2.87   Lipid Panel    Component Value Date/Time   CHOL 243 (H) 12/17/2016 1546   TRIG 280.0 (H) 12/17/2016 1546   HDL 39.50 12/17/2016 1546   CHOLHDL 6 12/17/2016 1546   VLDL 56.0 (H) 12/17/2016 1546   LDLDIRECT 172.0 12/17/2016 1546    Additional studies/ records that were reviewed today include:   Stress echo 08/30/2012 Study Conclusions  - Stress ECG conclusions: The stress ECG was normal. - Staged echo: There was no echocardiographic evidence for stress-induced ischemia. Impressions:  - Stress echocardiogram with no chest pain, no ST changes and no stress-induced wall motion abnormalities.   Holter monitor 10/20/2016 Study Highlights    Sinus rhythm. Occasional sinus tachycardia.  PVCs which occasionally correlate to palpitations.  No sustained pathologic arrhtyhmias.      ASSESSMENT:    1. Palpitation   2. Rheumatoid arteritis ([place])   3. Hyperlipidemia, unspecified hyperlipidemia type   4. Atypical chest pain      PLAN:  In order of problems listed above:  1. Palpitation: Increase metoprolol to 37.5 mg twice daily.  Symptoms consistent with PVC.  2. Rheumatoid arthritis: Diagnosed earlier this year.  Patient has been started on Plaquenil.  QTC level 411  3. Atypical chest pain: Only last a few seconds at a time, does not associated with exertion, typically occur was very emotional stress.  EKG does not show significant ST-T wave changes  4. Hyperlipidemia: We will defer to primary care provider regarding annual lipid panel.  On Lovaza, he is not on any statin.   Medication Adjustments/Labs and Tests Ordered: Current medicines are reviewed at length with the patient today.  Concerns regarding medicines are outlined above.  Medication changes, Labs and Tests ordered today are listed in  the Patient Instructions below. Patient Instructions  Medication Instructions:  Increase Metoprolol to 37.5  mg twice daily. If you need a refill on your cardiac medications before your next appointment, please call your pharmacy.   Lab work: None Ordered. If you have labs (blood work) drawn today and your tests are completely normal, you will receive your results only by: Marland Kitchen MyChart Message (if you have MyChart) OR . A paper copy in the mail If you have any lab test that is abnormal or we need to change your treatment, we will call you to review the results.  Testing/Procedures: None Ordered.  Follow-Up: At [facility], you and your health needs are our priority.  As part of our continuing mission to provide you with exceptional heart care, we have created designated Provider Care Teams.  These Care Teams include your primary Cardiologist (physician) and Advanced Practice Providers (APPs -  Physician Assistants and Nurse Practitioners) who all work together to provide you with the care you need, when you need it. You will need a follow up appointment in 4-6 months.  Please call our office 2 months in advance to schedule this appointment.  You may see Dr.Crenshaw or one of the following Advanced Practice Providers on your designated Care Team:   Kerin Ransom, PA-C Roby Lofts, [place] . Sande Rives, PA-C  Any Other Special Instructions Will Be Listed Below (If Applicable). Increase activity Call us with the dose of medication that you take at home per [facility].       Hilbert Corrigan, [place]  10/14/2018 11:35 PM    [facility] [address], [place], [place]  [address] Phone: [phone]; Fax: [phone]

## 2018-10-13 ENCOUNTER — Other Ambulatory Visit (INDEPENDENT_AMBULATORY_CARE_PROVIDER_SITE_OTHER): Payer: BLUE CROSS/BLUE SHIELD

## 2018-10-13 DIAGNOSIS — R002 Palpitations: Secondary | ICD-10-CM

## 2018-10-14 ENCOUNTER — Encounter: Payer: Self-pay | Admitting: Physician Assistant

## 2018-11-15 ENCOUNTER — Encounter: Payer: BLUE CROSS/BLUE SHIELD | Admitting: Family Medicine

## 2018-11-26 NOTE — Progress Notes (Deleted)
HPI: FU palpitations. Stress echocardiogram 2009 normal. Stress echocardiogram October 2013 normal. Holter monitor December 2017 showed sinus rhythm with PVCs. Stress echocardiogram at [facility] November 2017 normal. Chest CT December 2017 showed coronary artery calcification. Seen 12/19 with palpitations and metoprolol increased. Since last seen,   Current Outpatient Medications  Medication Sig Dispense Refill  . b complex vitamins capsule Take 1 capsule by mouth daily.    . hydroxychloroquine (PLAQUENIL) 200 MG tablet Take by mouth.     . levothyroxine (SYNTHROID, LEVOTHROID) 25 MCG tablet TAKE 1 TABLET (25 MCG TOTAL) BY MOUTH DAILY BEFORE BREAKFAST. 30 tablet 3  . metoprolol tartrate (LOPRESSOR) 25 MG tablet Take 1.5 tablets (37.5) by mouth twice daily. 180 tablet 1  . omega-3 acid ethyl esters (LOVAZA) 1 g capsule Take 1 g by mouth daily.    Marland Kitchen omeprazole (PRILOSEC) 20 MG capsule TAKE ONE CAPSULE BY MOUTH TWICE A DAY 180 capsule 1  . predniSONE (DELTASONE) 20 MG tablet Take 2 tablets (40 mg total) by mouth 2 (two) times daily with a meal. (Patient taking differently: Take 40 mg by mouth 2 (two) times daily with a meal. ) 60 tablet 0  . TURMERIC PO Take 2,000 mg by mouth daily.    Marland Kitchen Ubiquinol (QUNOL COQ10/UBIQUINOL/MEGA) 100 MG CAPS Take 1 capsule by mouth daily.     No current facility-administered medications for this visit.      Past Medical History:  Diagnosis Date  . Adrenal adenoma, left    Incidentaloma noted on noncontrast chest CT done to f/u lung nodule seen on chest radiograph (10/2016).  . Allergy   . Anxiety 08/12/2012  . Atypical chest pain Fall 2013   Stress echo NORMAL 08/30/12  . Barrett esophagus 2012  . GERD (gastroesophageal reflux disease)   . Hyperlipidemia    HDL high, LDL high: simvastatin resulted in prolonged/severe muscle pain, led to long w/u and pt wants to avoid further statin unless lipids severely worsen  . Neck pain 08/12/2012  .  Nephrolithiasis 10/2016   Bilat, nonobstruct, noted on CT chest done for f/u of pulm nodule seen on chest radiograph  . Palpitations Fall/winter 2017   Improved with lopressor  . Subclinical hypothyroidism    Patient give hx of hypfunctioning goiter in the distant past, says he was given pills to "kill" the goiter.  Says bx of goiter was benign.    Past Surgical History:  Procedure Laterality Date  . 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 CARDIOVASCULAR STRESS TEST  08/2012   Stress echo normal.  Pt also reports normal ETT 09/2016.  Marland Kitchen ESOPHAGOGASTRODUODENOSCOPY  09/2011   Dr. Olevia Perches  . HOLTER MONITOR  10/2016   No pathologic arrhythmias.  Occ PVCs that occ corresponded to his palpitations.      Social History   Socioeconomic History  . Marital status: Married    Spouse name: Not on file  . Number of children: 2  . Years of education: Not on file  . Highest education level: Not on file  Occupational History    Employer: [employer]    Comment: [employer]  Social Needs  . Financial resource strain: Not on file  . Food insecurity:    Worry: Not on file    Inability: Not on file  . Transportation needs:    Medical: Not on file    Non-medical: Not on file  Tobacco Use  .  Smoking status: Former Smoker    Types: Cigarettes    Last attempt to quit: 08/18/1979    Years since quitting: 39.3  . Smokeless tobacco: Former Systems developer    Types: Chew  . Tobacco comment: has not used since age 31  Substance and Sexual Activity  . Alcohol use: No    Alcohol/week: 0.0 standard drinks  . Drug use: No  . Sexual activity: Yes  Lifestyle  . Physical activity:    Days per week: Not on file    Minutes per session: Not on file  . Stress: Not on file  Relationships  . Social connections:    Talks on phone: Not on file    Gets together: Not on file    Attends religious service: Not on file    Active member of club or organization: Not on file      Attends meetings of clubs or organizations: Not on file    Relationship status: Not on file  . Intimate partner violence:    Fear of current or ex partner: Not on file    Emotionally abused: Not on file    Physically abused: Not on file    Forced sexual activity: Not on file  Other Topics Concern  . Not on file  Social History Narrative   Married, boy/girl twins age 17yrs.   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   No T/A/Ds.   Cardiovasc exercise regularly.  Prudent diet.          Family History  Problem Relation Age of Onset  . Heart disease Father        Died of MI age 27, possible PE  . Heart disease Paternal Uncle        MI's in his 47s  . Heart disease Paternal Uncle   . Colon cancer Neg Hx     ROS: no fevers or chills, productive cough, hemoptysis, dysphasia, odynophagia, melena, hematochezia, dysuria, hematuria, rash, seizure activity, orthopnea, PND, pedal edema, claudication. Remaining systems are negative.  Physical Exam: Well-developed well-nourished in no acute distress.  Skin is warm and dry.  HEENT is normal.  Neck is supple.  Chest is clear to auscultation with normal expansion.  Cardiovascular exam is regular rate and rhythm.  Abdominal exam nontender or distended. No masses palpated. Extremities show no edema. neuro grossly intact  ECG- personally reviewed  A/P  1  Kirk Ruths, MD

## 2018-12-07 ENCOUNTER — Ambulatory Visit: Payer: BLUE CROSS/BLUE SHIELD | Admitting: Cardiology

## 2018-12-22 DIAGNOSIS — Z7952 Long term (current) use of systemic steroids: Secondary | ICD-10-CM | POA: Diagnosis not present

## 2018-12-22 DIAGNOSIS — M0579 Rheumatoid arthritis with rheumatoid factor of multiple sites without organ or systems involvement: Secondary | ICD-10-CM | POA: Diagnosis not present

## 2018-12-22 DIAGNOSIS — R202 Paresthesia of skin: Secondary | ICD-10-CM | POA: Diagnosis not present

## 2018-12-22 DIAGNOSIS — E559 Vitamin D deficiency, unspecified: Secondary | ICD-10-CM | POA: Diagnosis not present

## 2018-12-22 DIAGNOSIS — E538 Deficiency of other specified B group vitamins: Secondary | ICD-10-CM | POA: Diagnosis not present

## 2018-12-22 DIAGNOSIS — M79672 Pain in left foot: Secondary | ICD-10-CM | POA: Diagnosis not present

## 2018-12-22 DIAGNOSIS — M778 Other enthesopathies, not elsewhere classified: Secondary | ICD-10-CM | POA: Diagnosis not present

## 2018-12-23 ENCOUNTER — Telehealth: Payer: Self-pay | Admitting: Family Medicine

## 2018-12-23 NOTE — Telephone Encounter (Signed)
Please advise 

## 2018-12-23 NOTE — Telephone Encounter (Signed)
Copied from [place] [id number]. Topic: General - Inquiry >> Dec 23, 2018  2:36 PM Margot Ables wrote: Reason for CRM: pt called to note that Rheumatologist Dr. Annabelle Harman at [facility] advised him to notify Dr. Charlett Blake of increased TSH of 6.1 on 12/22/2018. Please call to advise pt.  [id number] - [place], [place] (Phone) [phone] (Fax)

## 2018-12-24 ENCOUNTER — Other Ambulatory Visit: Payer: Self-pay | Admitting: Family Medicine

## 2018-12-25 NOTE — Telephone Encounter (Signed)
Increase his Levothyroxine to 50 mcg tabs, 1 tab po daily and recheck TSH in 3 months

## 2018-12-26 MED ORDER — LEVOTHYROXINE SODIUM 50 MCG PO TABS: 50.0000 ug | ORAL_TABLET | Freq: Every day | ORAL | 3 refills | Status: DC

## 2018-12-26 NOTE — Telephone Encounter (Signed)
Medication sent in  Patient notified left detailed message for patient

## 2019-02-10 ENCOUNTER — Other Ambulatory Visit: Payer: Self-pay | Admitting: Family Medicine

## 2019-02-16 ENCOUNTER — Other Ambulatory Visit: Payer: Self-pay | Admitting: Family Medicine

## 2019-02-21 NOTE — Telephone Encounter (Signed)
New Message             Patient is returning someone call, he states the phone call was very static and would like a call back.             [phone]

## 2019-02-23 NOTE — Progress Notes (Signed)
Virtual Visit via Video Note   This visit type was conducted due to national recommendations for restrictions regarding the COVID-19 Pandemic (e.g. social distancing) in an effort to limit this patient's exposure and mitigate transmission in our community.  Due to his co-morbid illnesses, this patient is at least at moderate risk for complications without adequate follow up.  This format is felt to be most appropriate for this patient at this time.  All issues noted in this document were discussed and addressed.  A limited physical exam was performed with this format.  Please refer to the patient's chart for his consent to telehealth for [facility].   Evaluation Performed:  Follow-up visit  Date:  03/03/2019   ID:  Tyler Schmidt, DOB 1965/12/09, MRN [id number]  Patient Location: Home Provider Location: Home  PCP:  Mosie Lukes, MD  Cardiologist:  Dr Stanford Breed  Chief Complaint:  FU Palpitations  History of Present Illness:    FU palpitations. Stress echocardiogram 2009 normal. Stress echocardiogram October 2013 normal. Holter monitor December 2017 showed sinus rhythm with PVCs. Stress echocardiogram at [facility] November 2017 normal. Chest CT December 2017 showed coronary artery calcification.  Since last seen no CP or dyspnea; mild palpitations. No syncope  The patient does not have symptoms concerning for COVID-19 infection (fever, chills, cough, or new shortness of breath).    Past Medical History:  Diagnosis Date  . Adrenal adenoma, left    Incidentaloma noted on noncontrast chest CT done to f/u lung nodule seen on chest radiograph (10/2016).  . Allergy   . Anxiety 08/12/2012  . Atypical chest pain Fall 2013   Stress echo NORMAL 08/30/12  . Barrett esophagus 2012  . GERD (gastroesophageal reflux disease)   . Hyperlipidemia    HDL high, LDL high: simvastatin resulted in prolonged/severe muscle pain, led to long w/u and pt wants to avoid further statin unless  lipids severely worsen  . Neck pain 08/12/2012  . Nephrolithiasis 10/2016   Bilat, nonobstruct, noted on CT chest done for f/u of pulm nodule seen on chest radiograph  . Palpitations Fall/winter 2017   Improved with lopressor  . Subclinical hypothyroidism    Patient give hx of hypfunctioning goiter in the distant past, says he was given pills to "kill" the goiter.  Says bx of goiter was benign.   Past Surgical History:  Procedure Laterality Date  . 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 CARDIOVASCULAR STRESS TEST  08/2012   Stress echo normal.  Pt also reports normal ETT 09/2016.  Marland Kitchen ESOPHAGOGASTRODUODENOSCOPY  09/2011   Dr. Olevia Perches  . HOLTER MONITOR  10/2016   No pathologic arrhythmias.  Occ PVCs that occ corresponded to his palpitations.       Current Meds  Medication Sig  . b complex vitamins capsule Take 1 capsule by mouth daily.  . hydroxychloroquine (PLAQUENIL) 200 MG tablet Take by mouth.   . levothyroxine (SYNTHROID, LEVOTHROID) 50 MCG tablet Take 1 tablet (50 mcg total) by mouth daily.  . metoprolol tartrate (LOPRESSOR) 25 MG tablet TAKE 1 TABLET BY MOUTH TWICE A DAY (Patient taking differently: 37 mg. TAKE 1 TABLET BY MOUTH TWICE A DAY)  . omega-3 acid ethyl esters (LOVAZA) 1 g capsule Take 1 g by mouth daily.  Marland Kitchen omeprazole (PRILOSEC) 20 MG capsule TAKE ONE CAPSULE BY MOUTH TWICE A DAY (Patient taking differently: TAKE ONE CAPSULE BY MOUTH  DAILY)  .  predniSONE (DELTASONE) 20 MG tablet Take 2 tablets (40 mg total) by mouth 2 (two) times daily with a meal. (Patient taking differently: Take 2.5 mg by mouth 2 (two) times daily with a meal. )  . TURMERIC PO Take 2,000 mg by mouth daily.  Marland Kitchen Ubiquinol (QUNOL COQ10/UBIQUINOL/MEGA) 100 MG CAPS Take 1 capsule by mouth daily.     Allergies:   Simvastatin and Atorvastatin   Social History   Tobacco Use  . Smoking status: Former Smoker    Types: Cigarettes    Last attempt  to quit: 08/18/1979    Years since quitting: 39.5  . Smokeless tobacco: Former Systems developer    Types: Chew  . Tobacco comment: has not used since age 55  Substance Use Topics  . Alcohol use: No    Alcohol/week: 0.0 standard drinks  . Drug use: No     Family Hx: The patient's family history includes Heart disease in his father, paternal uncle, and paternal uncle. There is no history of Colon cancer.  ROS:   Please see the history of present illness.    Patient denies fevers, chills, productive cough. Arthralgias from RA. All other systems reviewed and are negative.   Recent Labs: 09/20/2018: ALT 22; BUN 16; Creatinine, Ser 0.94; Hemoglobin 13.6; Platelets 324.0; Potassium 4.9; Sodium 136; TSH 2.87   Recent Lipid Panel Lab Results  Component Value Date/Time   CHOL 243 (H) 12/17/2016 03:46 PM   TRIG 280.0 (H) 12/17/2016 03:46 PM   HDL 39.50 12/17/2016 03:46 PM   CHOLHDL 6 12/17/2016 03:46 PM   LDLDIRECT 172.0 12/17/2016 03:46 PM    Wt Readings from Last 3 Encounters:  03/03/19 159 lb 8 oz (72.3 kg)  10/12/18 162 lb 3.2 oz (73.6 kg)  08/09/18 141 lb 3.2 oz (64 kg)     Objective:    Vital Signs:  Ht 6\' 1"  (1.854 m)   Wt 159 lb 8 oz (72.3 kg)   BMI 21.04 kg/m    VITAL SIGNS:  reviewed  No acute distress Answers questions appropriately Normal affect Remainder of physical examination not performed; telehealth visit; coronavirus pandemic  ASSESSMENT & PLAN:    1. Chest pain-No recent symptoms; previous functional study negative; no further wu at this time. 2. Coronary calcification-add aspirin 81 mg daily.  Continue statin. 3. Hyperlipidemia-continue statin.  Lipids and liver monitored by primary care. 4. Palpitations-patient has had improved symptoms after metoprolol was increased.  We will continue with present dose.  Can consider monitoring the future if symptoms worsen.  COVID-19 Education: The importance of social distancing was discussed today.  Time:   Today, I  have spent 15 minutes with the patient with telehealth technology discussing the above problems.     Medication Adjustments/Labs and Tests Ordered: Current medicines are reviewed at length with the patient today.  Concerns regarding medicines are outlined above.   Tests Ordered: No orders of the defined types were placed in this encounter.   Medication Changes: No orders of the defined types were placed in this encounter.   Disposition:  Follow up in 6 month(s)  Signed, Kirk Ruths, MD  03/03/2019 7:58 AM    [facility]

## 2019-03-02 ENCOUNTER — Telehealth: Payer: Self-pay | Admitting: Cardiology

## 2019-03-02 NOTE — Telephone Encounter (Signed)
Smartphone/ my chart/ consent/ pre reg completed °

## 2019-03-03 ENCOUNTER — Encounter: Payer: Self-pay | Admitting: Cardiology

## 2019-03-03 ENCOUNTER — Telehealth (INDEPENDENT_AMBULATORY_CARE_PROVIDER_SITE_OTHER): Payer: BLUE CROSS/BLUE SHIELD | Admitting: Cardiology

## 2019-03-03 VITALS — Ht 73.0 in | Wt 159.5 lb

## 2019-03-03 DIAGNOSIS — I2510 Atherosclerotic heart disease of native coronary artery without angina pectoris: Secondary | ICD-10-CM

## 2019-03-03 DIAGNOSIS — R002 Palpitations: Secondary | ICD-10-CM

## 2019-03-03 DIAGNOSIS — E7800 Pure hypercholesterolemia, unspecified: Secondary | ICD-10-CM | POA: Diagnosis not present

## 2019-03-03 MED ORDER — ASPIRIN EC 81 MG PO TBEC: 81.0000 mg | DELAYED_RELEASE_TABLET | Freq: Every day | ORAL | 3 refills | Status: AC

## 2019-03-03 NOTE — Patient Instructions (Signed)
Medication Instructions:  START ASPIRIN 81 MG ONCE DAILY If you need a refill on your cardiac medications before your next appointment, please call your pharmacy.   Lab work: If you have labs (blood work) drawn today and your tests are completely normal, you will receive your results only by: Marland Kitchen MyChart Message (if you have MyChart) OR . A paper copy in the mail If you have any lab test that is abnormal or we need to change your treatment, we will call you to review the results.  Follow-Up: At [facility], you and your health needs are our priority.  As part of our continuing mission to provide you with exceptional heart care, we have created designated Provider Care Teams.  These Care Teams include your primary Cardiologist (physician) and Advanced Practice Providers (APPs -  Physician Assistants and Nurse Practitioners) who all work together to provide you with the care you need, when you need it. You will need a follow up appointment in 6 months.  Please call our office 2 months in advance to schedule this appointment.  You may see Kirk Ruths MD or one of the following Advanced Practice Providers on your designated Care Team:   Kerin Ransom, PA-C Roby Lofts, [place] . Sande Rives, PA-C

## 2019-03-07 ENCOUNTER — Telehealth: Payer: Self-pay

## 2019-03-07 MED ORDER — ROSUVASTATIN CALCIUM 10 MG PO TABS: 10.0000 mg | ORAL_TABLET | Freq: Every day | ORAL | 3 refills | Status: DC

## 2019-03-07 NOTE — Telephone Encounter (Signed)
Copied from [place] [id number]. Topic: General - Inquiry >> Mar 07, 2019 12:31 PM Rainey Pines A wrote: Patient stated that he would liek acallback from [facility] nurse in regards to his rosuvastatin (CRESTOR) 10 MG tablet [Pharmacy Med Name: ROSUVASTATIN CALCIUM 10 MG TAB] that was denied. Patient would like to know if he needs to be seen or have blood work done in order to get this prescription refilled.

## 2019-03-07 NOTE — Addendum Note (Signed)
Addended by: Magdalene Molly A on: 03/07/2019 01:58 PM   Modules accepted: Orders

## 2019-03-07 NOTE — Telephone Encounter (Signed)
Medications updated and sent in to patients pharmacy  Called left message for patient

## 2019-05-10 ENCOUNTER — Other Ambulatory Visit: Payer: Self-pay

## 2019-05-10 MED ORDER — METOPROLOL TARTRATE 25 MG PO TABS: 37.5000 mg | ORAL_TABLET | Freq: Two times a day (BID) | ORAL | 3 refills | Status: DC

## 2019-05-10 NOTE — Telephone Encounter (Signed)
Rx(s) sent to pharmacy electronically.  

## 2019-05-11 ENCOUNTER — Other Ambulatory Visit: Payer: Self-pay

## 2019-05-11 MED ORDER — METOPROLOL TARTRATE 25 MG PO TABS: 37.5000 mg | ORAL_TABLET | Freq: Two times a day (BID) | ORAL | 3 refills | Status: DC

## 2019-06-05 ENCOUNTER — Other Ambulatory Visit: Payer: Self-pay | Admitting: Family Medicine

## 2019-06-12 DIAGNOSIS — Z79899 Other long term (current) drug therapy: Secondary | ICD-10-CM | POA: Diagnosis not present

## 2019-06-12 DIAGNOSIS — M0609 Rheumatoid arthritis without rheumatoid factor, multiple sites: Secondary | ICD-10-CM | POA: Diagnosis not present

## 2019-06-13 DIAGNOSIS — M0609 Rheumatoid arthritis without rheumatoid factor, multiple sites: Secondary | ICD-10-CM | POA: Diagnosis not present

## 2019-06-13 DIAGNOSIS — Z79899 Other long term (current) drug therapy: Secondary | ICD-10-CM | POA: Diagnosis not present

## 2019-06-22 DIAGNOSIS — G5600 Carpal tunnel syndrome, unspecified upper limb: Secondary | ICD-10-CM | POA: Diagnosis not present

## 2019-06-22 DIAGNOSIS — M0579 Rheumatoid arthritis with rheumatoid factor of multiple sites without organ or systems involvement: Secondary | ICD-10-CM | POA: Diagnosis not present

## 2019-08-28 DIAGNOSIS — N202 Calculus of kidney with calculus of ureter: Secondary | ICD-10-CM | POA: Diagnosis not present

## 2019-08-28 DIAGNOSIS — R109 Unspecified abdominal pain: Secondary | ICD-10-CM | POA: Diagnosis not present

## 2019-08-28 DIAGNOSIS — N200 Calculus of kidney: Secondary | ICD-10-CM | POA: Diagnosis not present

## 2019-08-28 DIAGNOSIS — D3502 Benign neoplasm of left adrenal gland: Secondary | ICD-10-CM | POA: Diagnosis not present

## 2019-08-28 DIAGNOSIS — N201 Calculus of ureter: Secondary | ICD-10-CM | POA: Diagnosis not present

## 2019-08-28 DIAGNOSIS — K429 Umbilical hernia without obstruction or gangrene: Secondary | ICD-10-CM | POA: Diagnosis not present

## 2019-08-28 DIAGNOSIS — I10 Essential (primary) hypertension: Secondary | ICD-10-CM | POA: Diagnosis not present

## 2019-08-28 DIAGNOSIS — Z79899 Other long term (current) drug therapy: Secondary | ICD-10-CM | POA: Diagnosis not present

## 2019-08-28 DIAGNOSIS — Z87442 Personal history of urinary calculi: Secondary | ICD-10-CM | POA: Diagnosis not present

## 2019-08-28 DIAGNOSIS — R1032 Left lower quadrant pain: Secondary | ICD-10-CM | POA: Diagnosis not present

## 2019-09-14 ENCOUNTER — Other Ambulatory Visit: Payer: Self-pay | Admitting: Family Medicine

## 2019-09-20 DIAGNOSIS — N200 Calculus of kidney: Secondary | ICD-10-CM | POA: Diagnosis not present

## 2019-09-26 DIAGNOSIS — N200 Calculus of kidney: Secondary | ICD-10-CM | POA: Diagnosis not present

## 2019-09-26 DIAGNOSIS — R195 Other fecal abnormalities: Secondary | ICD-10-CM | POA: Diagnosis not present

## 2019-10-23 ENCOUNTER — Other Ambulatory Visit: Payer: Self-pay

## 2019-10-23 ENCOUNTER — Ambulatory Visit (INDEPENDENT_AMBULATORY_CARE_PROVIDER_SITE_OTHER): Payer: BC Managed Care – PPO | Admitting: Family Medicine

## 2019-10-23 DIAGNOSIS — M0579 Rheumatoid arthritis with rheumatoid factor of multiple sites without organ or systems involvement: Secondary | ICD-10-CM

## 2019-10-23 DIAGNOSIS — N200 Calculus of kidney: Secondary | ICD-10-CM | POA: Insufficient documentation

## 2019-10-23 DIAGNOSIS — R7401 Elevation of levels of liver transaminase levels: Secondary | ICD-10-CM | POA: Diagnosis not present

## 2019-10-23 DIAGNOSIS — E785 Hyperlipidemia, unspecified: Secondary | ICD-10-CM | POA: Diagnosis not present

## 2019-10-23 DIAGNOSIS — E039 Hypothyroidism, unspecified: Secondary | ICD-10-CM | POA: Diagnosis not present

## 2019-10-23 DIAGNOSIS — I2510 Atherosclerotic heart disease of native coronary artery without angina pectoris: Secondary | ICD-10-CM

## 2019-10-23 DIAGNOSIS — E559 Vitamin D deficiency, unspecified: Secondary | ICD-10-CM

## 2019-10-23 MED ORDER — METOPROLOL TARTRATE 25 MG PO TABS: 25.0000 mg | ORAL_TABLET | Freq: Two times a day (BID) | ORAL | 3 refills | Status: DC

## 2019-10-23 NOTE — Assessment & Plan Note (Signed)
Is following with urology at [facility] now and is hydrating well

## 2019-10-23 NOTE — Assessment & Plan Note (Addendum)
Following with rheumatology at [facility] and doing well won the hydroxychloroquine

## 2019-10-23 NOTE — Assessment & Plan Note (Signed)
Check labs, minimize processed carbs and fatty foods

## 2019-10-23 NOTE — Assessment & Plan Note (Signed)
Follows with Dr Stanford Breed. Doing well

## 2019-10-23 NOTE — Progress Notes (Signed)
Virtual Visit via Video Note  I connected with Tyler Schmidt on 10/23/19 at  9:00 AM EST by a video enabled telemedicine application and verified that I am speaking with the correct person using two identifiers.  Location: Patient: home Provider: home   I discussed the limitations of evaluation and management by telemedicine and the availability of in person appointments. The patient expressed understanding and agreed to proceed. Tyler Schmidt, CMA was able to get the patient set up on a visit, video   Subjective:    Patient ID: Tyler Schmidt, male    DOB: 03-Mar-1966, 53 y.o.   MRN: [id number]  No chief complaint on file.   HPI Patient is in today for follow up on chronic medical concerns including hypothyroidism, rheumatoid arthritis and more. He reports he is feeling better than he has felt in years. Less myalgias, fatigue and more. No recent febrile illness or hospitalizations. No polyuria or polydipsia. Denies CP/palp/SOB/HA/congestion/fevers/GI or GU c/o. Taking meds as prescribed  Past Medical History:  Diagnosis Date  . Adrenal adenoma, left    Incidentaloma noted on noncontrast chest CT done to f/u lung nodule seen on chest radiograph (10/2016).  . Allergy   . Anxiety 08/12/2012  . Atypical chest pain Fall 2013   Stress echo NORMAL 08/30/12  . Barrett esophagus 2012  . GERD (gastroesophageal reflux disease)   . Hyperlipidemia    HDL high, LDL high: simvastatin resulted in prolonged/severe muscle pain, led to long w/u and pt wants to avoid further statin unless lipids severely worsen  . Neck pain 08/12/2012  . Nephrolithiasis 10/2016   Bilat, nonobstruct, noted on CT chest done for f/u of pulm nodule seen on chest radiograph  . Palpitations Fall/winter 2017   Improved with lopressor  . Subclinical hypothyroidism    Patient give hx of hypfunctioning goiter in the distant past, says he was given pills to "kill" the goiter.  Says bx of goiter was benign.     Past Surgical History:  Procedure Laterality Date  . 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 Doreatha Lew)  . CARDIOVASCULAR STRESS TEST  08/2012   Stress echo normal.  Pt also reports normal ETT 09/2016.  Marland Kitchen ESOPHAGOGASTRODUODENOSCOPY  09/2011   Dr. Olevia Perches  . HOLTER MONITOR  10/2016   No pathologic arrhythmias.  Occ PVCs that occ corresponded to his palpitations.      Family History  Problem Relation Age of Onset  . Heart disease Father        Died of MI age 60, possible PE  . Heart disease Paternal Uncle        MI's in his 61s  . Heart disease Paternal Uncle   . Colon cancer Neg Hx     Social History   Socioeconomic History  . Marital status: Married    Spouse name: Not on file  . Number of children: 2  . Years of education: Not on file  . Highest education level: Not on file  Occupational History    Employer: [employer]    Comment: Pilot  Tobacco Use  . Smoking status: Former Smoker    Types: Cigarettes    Quit date: 08/18/1979    Years since quitting: 40.2  . Smokeless tobacco: Former Systems developer    Types: Chew  . Tobacco comment: has not used since age 50  Substance and Sexual Activity  . Alcohol use: No    Alcohol/week: 0.0 standard drinks  .  Drug use: No  . Sexual activity: Yes  Other Topics Concern  . Not on file  Social History Narrative   Married, boy/girl twins age 60yrs.   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   Originally from [place].   No T/A/Ds.   Cardiovasc exercise regularly.  Prudent diet.         Social Determinants of Health   Financial Resource Strain:   . Difficulty of Paying Living Expenses: Not on file  Food Insecurity:   . Worried About Charity fundraiser in the Last Year: Not on file  . Ran Out of Food in the Last Year: Not on file  Transportation Needs:   . Lack of Transportation (Medical): Not on file  . Lack of Transportation (Non-Medical): Not on file  Physical Activity:   . Days of Exercise  per Week: Not on file  . Minutes of Exercise per Session: Not on file  Stress:   . Feeling of Stress : Not on file  Social Connections:   . Frequency of Communication with Friends and Family: Not on file  . Frequency of Social Gatherings with Friends and Family: Not on file  . Attends Religious Services: Not on file  . Active Member of Clubs or Organizations: Not on file  . Attends Archivist Meetings: Not on file  . Marital Status: Not on file  Intimate Partner Violence:   . Fear of Current or Ex-Partner: Not on file  . Emotionally Abused: Not on file  . Physically Abused: Not on file  . Sexually Abused: Not on file    Outpatient Medications Prior to Visit  Medication Sig Dispense Refill  . aspirin EC 81 MG tablet Take 1 tablet (81 mg total) by mouth daily. 90 tablet 3  . hydroxychloroquine (PLAQUENIL) 200 MG tablet Take by mouth.     . levothyroxine (SYNTHROID, LEVOTHROID) 50 MCG tablet Take 1 tablet (50 mcg total) by mouth daily. 90 tablet 3  . omega-3 acid ethyl esters (LOVAZA) 1 g capsule Take 1 g by mouth daily.    Marland Kitchen omeprazole (PRILOSEC) 20 MG capsule TAKE ONE CAPSULE BY MOUTH TWICE A DAY 180 capsule 0  . rosuvastatin (CRESTOR) 10 MG tablet TAKE 1 TABLET BY MOUTH EVERY DAY 90 tablet 1  . TURMERIC PO Take 2,000 mg by mouth daily.    Marland Kitchen Ubiquinol (QUNOL COQ10/UBIQUINOL/MEGA) 100 MG CAPS Take 1 capsule by mouth daily.    Marland Kitchen b complex vitamins capsule Take 1 capsule by mouth daily.    . metoprolol tartrate (LOPRESSOR) 25 MG tablet Take 1.5 tablets (37.5 mg total) by mouth 2 (two) times daily. 180 tablet 3  . predniSONE (DELTASONE) 20 MG tablet Take 2 tablets (40 mg total) by mouth 2 (two) times daily with a meal. (Patient taking differently: Take 2.5 mg by mouth 2 (two) times daily with a meal. ) 60 tablet 0   No facility-administered medications prior to visit.    Allergies  Allergen Reactions  . Simvastatin Other (See Comments)    myalgias  . Atorvastatin Other  (See Comments)    Joint pain and low back pain    Review of Systems  Constitutional: Negative for fever and malaise/fatigue.  HENT: Negative for congestion.   Eyes: Negative for blurred vision.  Respiratory: Negative for shortness of breath.   Cardiovascular: Negative for chest pain, palpitations and leg swelling.  Gastrointestinal: Negative for abdominal pain, blood in stool and nausea.  Genitourinary: Negative for dysuria and frequency.  Musculoskeletal:  Negative for falls.  Skin: Negative for rash.  Neurological: Negative for dizziness, loss of consciousness and headaches.  Endo/Heme/Allergies: Negative for environmental allergies.  Psychiatric/Behavioral: Negative for depression. The patient is not nervous/anxious.        Objective:    Physical Exam Constitutional:      Appearance: Normal appearance. He is obese. He is not ill-appearing.  HENT:     Head: Normocephalic and atraumatic.     Nose: Nose normal.  Eyes:     General:        Right eye: No discharge.        Left eye: No discharge.  Pulmonary:     Effort: Pulmonary effort is normal.  Neurological:     Mental Status: He is alert and oriented to person, place, and time.  Psychiatric:        Mood and Affect: Mood normal.        Behavior: Behavior normal.     BP 120/80 (BP Location: Left Arm, Patient Position: Sitting, Cuff Size: Normal)   Pulse 70   Wt 165 lb (74.8 kg)   BMI 21.77 kg/m  Wt Readings from Last 3 Encounters:  10/23/19 165 lb (74.8 kg)  03/03/19 159 lb 8 oz (72.3 kg)  10/12/18 162 lb 3.2 oz (73.6 kg)    Diabetic Foot Exam - Simple   No data filed     Lab Results  Component Value Date   WBC 13.2 (H) 09/20/2018   HGB 13.6 09/20/2018   HCT 40.9 09/20/2018   PLT 324.0 09/20/2018   GLUCOSE 95 09/20/2018   CHOL 243 (H) 12/17/2016   TRIG 280.0 (H) 12/17/2016   HDL 39.50 12/17/2016   LDLDIRECT 172.0 12/17/2016   ALT 22 09/20/2018   AST 12 09/20/2018   NA 136 09/20/2018   K 4.9  09/20/2018   CL 99 09/20/2018   CREATININE 0.94 09/20/2018   BUN 16 09/20/2018   CO2 29 09/20/2018   TSH 2.87 09/20/2018    Lab Results  Component Value Date   TSH 2.87 09/20/2018   Lab Results  Component Value Date   WBC 13.2 (H) 09/20/2018   HGB 13.6 09/20/2018   HCT 40.9 09/20/2018   MCV 83.7 09/20/2018   PLT 324.0 09/20/2018   Lab Results  Component Value Date   NA 136 09/20/2018   K 4.9 09/20/2018   CO2 29 09/20/2018   GLUCOSE 95 09/20/2018   BUN 16 09/20/2018   CREATININE 0.94 09/20/2018   BILITOT 0.8 09/20/2018   ALKPHOS 55 09/20/2018   AST 12 09/20/2018   ALT 22 09/20/2018   PROT 6.3 09/20/2018   PROT 6.5 09/20/2018   ALBUMIN 3.7 09/20/2018   CALCIUM 9.6 09/20/2018   ANIONGAP 8 08/01/2018   GFR 90.04 09/20/2018   Lab Results  Component Value Date   CHOL 243 (H) 12/17/2016   Lab Results  Component Value Date   HDL 39.50 12/17/2016   No results found for: [facility] Lab Results  Component Value Date   TRIG 280.0 (H) 12/17/2016   Lab Results  Component Value Date   CHOLHDL 6 12/17/2016   No results found for: HGBA1C     Assessment & Plan:   Problem List Items Addressed This Visit    Hyperlipidemia, mild    Encouraged heart healthy diet, increase exercise, avoid trans fats, consider a krill oil cap daily      Relevant Medications   metoprolol tartrate (LOPRESSOR) 25 MG tablet   Other Relevant Orders  Lipid panel   Coronary artery calcification seen on CT scan    Follows with Dr Stanford Breed. Doing well      Relevant Medications   metoprolol tartrate (LOPRESSOR) 25 MG tablet   Hypothyroid    Feels much better on the higher dose of the Levothyroxine. His thingking, myalgias and energy are much better.      Relevant Medications   metoprolol tartrate (LOPRESSOR) 25 MG tablet   Other Relevant Orders   CBC   CMP   TSH   Rheumatoid arthritis ([place])    Following with rheumatology at [facility] and doing well won the hydroxychloroquine       Elevated transaminase level    Check labs, minimize processed carbs and fatty foods      Kidney stones    Is following with urology at [facility] now and is hydrating well      Vitamin D deficiency    Supplement and monitor      Relevant Orders   VITAMIN D      I have discontinued [place] "Jon"'s b complex vitamins and predniSONE. I have also changed his metoprolol tartrate. Additionally, I am having him maintain his Ubiquinol, omega-3 acid ethyl esters, TURMERIC PO, hydroxychloroquine, levothyroxine, aspirin EC, rosuvastatin, and omeprazole.  Meds ordered this encounter  Medications  . metoprolol tartrate (LOPRESSOR) 25 MG tablet    Sig: Take 1 tablet (25 mg total) by mouth 2 (two) times daily.    Dispense:  180 tablet    Refill:  3    I discussed the assessment and treatment plan with the patient. The patient was provided an opportunity to ask questions and all were answered. The patient agreed with the plan and demonstrated an understanding of the instructions.   The patient was advised to call back or seek an in-person evaluation if the symptoms worsen or if the condition fails to improve as anticipated.  I provided 25 minutes of non-face-to-face time during this encounter.   Penni Homans, MD

## 2019-10-23 NOTE — Assessment & Plan Note (Signed)
Supplement and monitor 

## 2019-10-23 NOTE — Assessment & Plan Note (Signed)
Encouraged heart healthy diet, increase exercise, avoid trans fats, consider a krill oil cap daily 

## 2019-10-23 NOTE — Assessment & Plan Note (Signed)
Feels much better on the higher dose of the Levothyroxine. His thingking, myalgias and energy are much better.

## 2019-10-31 ENCOUNTER — Ambulatory Visit: Payer: BLUE CROSS/BLUE SHIELD | Admitting: Family Medicine

## 2019-11-05 ENCOUNTER — Other Ambulatory Visit: Payer: Self-pay | Admitting: Physician Assistant

## 2019-11-12 ENCOUNTER — Other Ambulatory Visit: Payer: Self-pay | Admitting: Family Medicine

## 2019-11-12 ENCOUNTER — Other Ambulatory Visit: Payer: Self-pay | Admitting: Physician Assistant

## 2019-11-20 ENCOUNTER — Other Ambulatory Visit (INDEPENDENT_AMBULATORY_CARE_PROVIDER_SITE_OTHER): Payer: BLUE CROSS/BLUE SHIELD

## 2019-11-20 ENCOUNTER — Other Ambulatory Visit: Payer: Self-pay

## 2019-11-20 DIAGNOSIS — E785 Hyperlipidemia, unspecified: Secondary | ICD-10-CM | POA: Diagnosis not present

## 2019-11-20 DIAGNOSIS — E039 Hypothyroidism, unspecified: Secondary | ICD-10-CM

## 2019-11-20 DIAGNOSIS — E559 Vitamin D deficiency, unspecified: Secondary | ICD-10-CM

## 2019-11-20 LAB — CBC
HCT: 43.9 % (ref 39.0–52.0)
Hemoglobin: 14.9 g/dL (ref 13.0–17.0)
MCHC: 33.9 g/dL (ref 30.0–36.0)
MCV: 85.7 fl (ref 78.0–100.0)
Platelets: 218 10*3/uL (ref 150.0–400.0)
RBC: 5.13 Mil/uL (ref 4.22–5.81)
RDW: 13.4 % (ref 11.5–15.5)
WBC: 5.4 10*3/uL (ref 4.0–10.5)

## 2019-11-20 LAB — TSH: TSH: 3.64 u[IU]/mL (ref 0.35–4.50)

## 2019-11-20 LAB — COMPREHENSIVE METABOLIC PANEL
ALT: 17 U/L (ref 0–53)
AST: 15 U/L (ref 0–37)
Albumin: 4.4 g/dL (ref 3.5–5.2)
Alkaline Phosphatase: 46 U/L (ref 39–117)
BUN: 18 mg/dL (ref 6–23)
CO2: 30 mEq/L (ref 19–32)
Calcium: 9.3 mg/dL (ref 8.4–10.5)
Chloride: 103 mEq/L (ref 96–112)
Creatinine, Ser: 1.08 mg/dL (ref 0.40–1.50)
GFR: 71.84 mL/min (ref >60.00)
Glucose, Bld: 97 mg/dL (ref 70–99)
Potassium: 4.1 mEq/L (ref 3.5–5.1)
Sodium: 139 mEq/L (ref 135–145)
Total Bilirubin: 0.7 mg/dL (ref 0.2–1.2)
Total Protein: 6.9 g/dL (ref 6.0–8.3)

## 2019-11-20 LAB — LIPID PANEL
Cholesterol: 168 mg/dL (ref 0–200)
HDL: 46.2 mg/dL (ref >39.00)
LDL Cholesterol: 108 mg/dL — ABNORMAL HIGH (ref 0–99)
NonHDL: 122.05
Total CHOL/HDL Ratio: 4
Triglycerides: 70 mg/dL (ref 0.0–149.0)
VLDL: 14 mg/dL (ref 0.0–40.0)

## 2019-11-20 LAB — VITAMIN D 25 HYDROXY (VIT D DEFICIENCY, FRACTURES): VITD: 31.66 ng/mL (ref 30.00–100.00)

## 2019-12-13 ENCOUNTER — Telehealth: Payer: Self-pay

## 2019-12-13 ENCOUNTER — Encounter: Payer: Self-pay | Admitting: Cardiology

## 2019-12-13 ENCOUNTER — Telehealth (INDEPENDENT_AMBULATORY_CARE_PROVIDER_SITE_OTHER): Payer: BC Managed Care – PPO | Admitting: Cardiology

## 2019-12-13 VITALS — HR 80 | Ht 73.0 in | Wt 159.0 lb

## 2019-12-13 DIAGNOSIS — I2510 Atherosclerotic heart disease of native coronary artery without angina pectoris: Secondary | ICD-10-CM

## 2019-12-13 DIAGNOSIS — M069 Rheumatoid arthritis, unspecified: Secondary | ICD-10-CM

## 2019-12-13 DIAGNOSIS — E785 Hyperlipidemia, unspecified: Secondary | ICD-10-CM | POA: Diagnosis not present

## 2019-12-13 DIAGNOSIS — E039 Hypothyroidism, unspecified: Secondary | ICD-10-CM

## 2019-12-13 DIAGNOSIS — R002 Palpitations: Secondary | ICD-10-CM | POA: Diagnosis not present

## 2019-12-13 DIAGNOSIS — E038 Other specified hypothyroidism: Secondary | ICD-10-CM

## 2019-12-13 DIAGNOSIS — R0789 Other chest pain: Secondary | ICD-10-CM

## 2019-12-13 NOTE — Patient Instructions (Signed)
Medication Instructions:  Your physician recommends that you continue on your current medications as directed. Please refer to the Current Medication list given to you today. *If you need a refill on your cardiac medications before your next appointment, please call your pharmacy*  Lab Work: None  If you have labs (blood work) drawn today and your tests are completely normal, you will receive your results only by: . MyChart Message (if you have MyChart) OR . A paper copy in the mail If you have any lab test that is abnormal or we need to change your treatment, we will call you to review the results.  Testing/Procedures: None   Follow-Up: At [facility], you and your health needs are our priority.  As part of our continuing mission to provide you with exceptional heart care, we have created designated Provider Care Teams.  These Care Teams include your primary Cardiologist (physician) and Advanced Practice Providers (APPs -  Physician Assistants and Nurse Practitioners) who all work together to provide you with the care you need, when you need it.  Your next appointment:   12 month(s)  The format for your next appointment:   In Person  Provider:   Brian Crenshaw, MD  Other Instructions  

## 2019-12-13 NOTE — Telephone Encounter (Signed)
Contacted patient to discuss AVS Instructions. Gave patient Luke's recommendations from today's virtual office visit. Informed patient that someone from the scheduling dept will be in contact with them to schedule their follow up appt. Patient voiced understanding; AVS printed and mailed to patient.    

## 2019-12-13 NOTE — Telephone Encounter (Signed)
Left message for patient to contact office to get virtual visit started.  

## 2019-12-13 NOTE — Telephone Encounter (Signed)

## 2019-12-13 NOTE — Progress Notes (Signed)
Virtual Visit via Telephone Note   This visit type was conducted due to national recommendations for restrictions regarding the COVID-19 Pandemic (e.g. social distancing) in an effort to limit this patient's exposure and mitigate transmission in our community.  Due to his co-morbid illnesses, this patient is at least at moderate risk for complications without adequate follow up.  This format is felt to be most appropriate for this patient at this time.  The patient did not have access to video technology/had technical difficulties with video requiring transitioning to audio format only (telephone).  All issues noted in this document were discussed and addressed.  No physical exam could be performed with this format.  Please refer to the patient's chart for his  consent to telehealth for [facility].   Date:  12/13/2019   ID:  Tyler Schmidt, DOB Oct 11, 1966, MRN [id number]  Patient Location: Home Provider Location: Home  PCP:  Mosie Lukes, MD  Cardiologist:  Dr Stanford Breed Electrophysiologist:  None   Evaluation Performed:  Follow-Up Visit  Chief Complaint:  none  History of Present Illness:    Tyler Schmidt is a 54 y.o. male with history of coronary calcification seen on a CT scan in 2017.  He does not have angina.  Other problems include dyslipidemia, palpitations, rheumatoid arthritis, and subclinical hypothyroidism.  In the past the patient's had atypical chest pain and palpitations.  He had been on beta-blocker for his palpitations.  He has had several stress studies that did not suggest significant coronary disease.  Patient was contacted today for routine follow-up.  He tells me that since we saw him last his family doctor has adjusted his Synthroid and since then he has "felt the best I have felt in years".  He denies any palpitations or chest pain.  He is actually taper down his metoprolol dose to 25 mg daily.  Recent lipid panel showed an LDL of 108.  He has had problems  with myalgias on higher doses of statins and is currently on rosuvastatin 10 mg 3 x week.   The patient does not have symptoms concerning for COVID-19 infection (fever, chills, cough, or new shortness of breath).    Past Medical History:  Diagnosis Date  . Adrenal adenoma, left    Incidentaloma noted on noncontrast chest CT done to f/u lung nodule seen on chest radiograph (10/2016).  . Allergy   . Anxiety 08/12/2012  . Atypical chest pain Fall 2013   Stress echo NORMAL 08/30/12  . Barrett esophagus 2012  . GERD (gastroesophageal reflux disease)   . Hyperlipidemia    HDL high, LDL high: simvastatin resulted in prolonged/severe muscle pain, led to long w/u and pt wants to avoid further statin unless lipids severely worsen  . Neck pain 08/12/2012  . Nephrolithiasis 10/2016   Bilat, nonobstruct, noted on CT chest done for f/u of pulm nodule seen on chest radiograph  . Palpitations Fall/winter 2017   Improved with lopressor  . Subclinical hypothyroidism    Patient give hx of hypfunctioning goiter in the distant past, says he was given pills to "kill" the goiter.  Says bx of goiter was benign.   Past Surgical History:  Procedure Laterality Date  . 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 CARDIOVASCULAR STRESS TEST  08/2012   Stress echo normal.  Pt also reports normal ETT 09/2016.  Marland Kitchen ESOPHAGOGASTRODUODENOSCOPY  09/2011   Dr. Olevia Perches  . HOLTER MONITOR  10/2016   No pathologic arrhythmias.  Occ PVCs that occ corresponded to his palpitations.       Current Meds  Medication Sig  . Calcium Citrate-Vitamin D 200-250 MG-UNIT TABS Take 1 tablet by mouth daily.  . hydroxychloroquine (PLAQUENIL) 200 MG tablet Take by mouth. patient take 1 tablet bid x 5 days a week  . levothyroxine (SYNTHROID) 50 MCG tablet TAKE 1 TABLET BY MOUTH EVERY DAY  . metoprolol tartrate (LOPRESSOR) 25 MG tablet Take 1 tablet (25 mg total) by mouth 2 (two) times  daily. (Patient taking differently: Take 25 mg by mouth daily. )  . Omega-3 Fatty Acids (FISH OIL) 1000 MG CAPS Take 1 capsule by mouth daily.  Marland Kitchen omeprazole (PRILOSEC) 20 MG capsule TAKE ONE CAPSULE BY MOUTH TWICE A DAY (Patient taking differently: Take 20 mg by mouth daily. TAKE ONE CAPSULE BY MOUTH TWICE A DAY)  . rosuvastatin (CRESTOR) 10 MG tablet TAKE 1 TABLET BY MOUTH EVERY DAY (Patient taking differently: Pt takes 3 days a week m/w/f)  . TURMERIC PO Take 2,000 mg by mouth daily.  Marland Kitchen Ubiquinol (QUNOL COQ10/UBIQUINOL/MEGA) 100 MG CAPS Take 1 capsule by mouth daily.     Allergies:   Simvastatin and Atorvastatin   Social History   Tobacco Use  . Smoking status: Former Smoker    Types: Cigarettes    Quit date: 08/18/1979    Years since quitting: 40.3  . Smokeless tobacco: Former Systems developer    Types: Chew  . Tobacco comment: has not used since age 69  Substance Use Topics  . Alcohol use: No    Alcohol/week: 0.0 standard drinks  . Drug use: No     Family Hx: The patient's family history includes Heart disease in his father, paternal uncle, and paternal uncle. There is no history of Colon cancer.  ROS:   Please see the history of present illness.    All other systems reviewed and are negative.   Prior CV studies:   The following studies were reviewed today: CT of abdomin and pelvis Sept 2019  Labs/Other Tests and Data Reviewed:    EKG:  An ECG dated 10/12/2018 was personally reviewed today and demonstrated:  NSR-HR 87  Recent Labs: 11/20/2019: ALT 17; BUN 18; Creatinine, Ser 1.08; Hemoglobin 14.9; Platelets 218.0; Potassium 4.1; Sodium 139; TSH 3.64   Recent Lipid Panel Lab Results  Component Value Date/Time   CHOL 168 11/20/2019 09:24 AM   TRIG 70.0 11/20/2019 09:24 AM   HDL 46.20 11/20/2019 09:24 AM   CHOLHDL 4 11/20/2019 09:24 AM   LDLCALC 108 (H) 11/20/2019 09:24 AM   LDLDIRECT 172.0 12/17/2016 03:46 PM    Wt Readings from Last 3 Encounters:  12/13/19 159 lb (72.1  kg)  10/23/19 165 lb (74.8 kg)  03/03/19 159 lb 8 oz (72.3 kg)     Objective:    Vital Signs:  Pulse 80   Ht 6\' 1"  (1.854 m)   Wt 159 lb (72.1 kg)   BMI 20.98 kg/m    VITAL SIGNS:  reviewed  ASSESSMENT & PLAN:    CAD- Incidental finding of coronary Ca++ on a CT in 2017 (I did not see this mentioned of CT 2019).  He has no current history of angina.  Palpitations- Now on low dose metoprolol.  He attributes resolution of his palpitations to adjustment in his Synthroid.  HLD- Myalgias with simvastatin and atorvastatin. LDL 108 on Crestor 10 mg 3 times a week.   RA- Stable on plaquenil.  Hypothyroid- On Synthroid- PCP follows.   Plan: OV one year.   COVID-19 Education: The signs and symptoms of COVID-19 were discussed with the patient and how to seek care for testing (follow up with PCP or arrange E-visit).  The importance of social distancing was discussed today.  Time:   Today, I have spent 10 minutes with the patient with telehealth technology discussing the above problems.     Medication Adjustments/Labs and Tests Ordered: Current medicines are reviewed at length with the patient today.  Concerns regarding medicines are outlined above.   Tests Ordered: No orders of the defined types were placed in this encounter.   Medication Changes: No orders of the defined types were placed in this encounter.   Follow Up:  In Person Dr Stanford Breed one year  Signed, Kerin Ransom, Hershal Coria  12/13/2019 9:06 AM    [place]

## 2019-12-13 NOTE — Telephone Encounter (Signed)
Follow Up ° °Patient is returning call. Please give patient a call back.  °

## 2020-01-25 DIAGNOSIS — E559 Vitamin D deficiency, unspecified: Secondary | ICD-10-CM | POA: Diagnosis not present

## 2020-01-25 DIAGNOSIS — M0579 Rheumatoid arthritis with rheumatoid factor of multiple sites without organ or systems involvement: Secondary | ICD-10-CM | POA: Diagnosis not present

## 2020-02-03 ENCOUNTER — Other Ambulatory Visit: Payer: Self-pay | Admitting: Family Medicine

## 2020-02-29 DIAGNOSIS — E559 Vitamin D deficiency, unspecified: Secondary | ICD-10-CM | POA: Diagnosis not present

## 2020-02-29 DIAGNOSIS — M0579 Rheumatoid arthritis with rheumatoid factor of multiple sites without organ or systems involvement: Secondary | ICD-10-CM | POA: Diagnosis not present

## 2020-03-03 ENCOUNTER — Other Ambulatory Visit: Payer: Self-pay | Admitting: Physician Assistant

## 2020-03-03 ENCOUNTER — Other Ambulatory Visit: Payer: Self-pay | Admitting: Family Medicine

## 2020-03-04 NOTE — Telephone Encounter (Signed)
Rx has been sent to the pharmacy electronically. ° °

## 2020-04-16 ENCOUNTER — Ambulatory Visit: Payer: BC Managed Care – PPO | Admitting: Family Medicine

## 2020-04-22 ENCOUNTER — Ambulatory Visit: Payer: BC Managed Care – PPO | Admitting: Family Medicine

## 2020-05-10 ENCOUNTER — Other Ambulatory Visit: Payer: Self-pay

## 2020-05-10 ENCOUNTER — Telehealth (INDEPENDENT_AMBULATORY_CARE_PROVIDER_SITE_OTHER): Payer: BC Managed Care – PPO | Admitting: Family Medicine

## 2020-05-10 DIAGNOSIS — E785 Hyperlipidemia, unspecified: Secondary | ICD-10-CM

## 2020-05-10 DIAGNOSIS — R7689 Other specified abnormal immunological findings in serum: Secondary | ICD-10-CM

## 2020-05-10 DIAGNOSIS — R7989 Other specified abnormal findings of blood chemistry: Secondary | ICD-10-CM

## 2020-05-10 DIAGNOSIS — R7401 Elevation of levels of liver transaminase levels: Secondary | ICD-10-CM

## 2020-05-10 DIAGNOSIS — E039 Hypothyroidism, unspecified: Secondary | ICD-10-CM | POA: Diagnosis not present

## 2020-05-10 DIAGNOSIS — E038 Other specified hypothyroidism: Secondary | ICD-10-CM

## 2020-05-10 DIAGNOSIS — R768 Other specified abnormal immunological findings in serum: Secondary | ICD-10-CM | POA: Diagnosis not present

## 2020-05-10 DIAGNOSIS — K219 Gastro-esophageal reflux disease without esophagitis: Secondary | ICD-10-CM

## 2020-05-10 DIAGNOSIS — E559 Vitamin D deficiency, unspecified: Secondary | ICD-10-CM

## 2020-05-10 DIAGNOSIS — R351 Nocturia: Secondary | ICD-10-CM

## 2020-05-10 DIAGNOSIS — M069 Rheumatoid arthritis, unspecified: Secondary | ICD-10-CM

## 2020-05-13 ENCOUNTER — Other Ambulatory Visit (INDEPENDENT_AMBULATORY_CARE_PROVIDER_SITE_OTHER): Payer: BC Managed Care – PPO

## 2020-05-13 ENCOUNTER — Other Ambulatory Visit: Payer: Self-pay

## 2020-05-13 DIAGNOSIS — R7989 Other specified abnormal findings of blood chemistry: Secondary | ICD-10-CM | POA: Insufficient documentation

## 2020-05-13 DIAGNOSIS — E785 Hyperlipidemia, unspecified: Secondary | ICD-10-CM

## 2020-05-13 DIAGNOSIS — K219 Gastro-esophageal reflux disease without esophagitis: Secondary | ICD-10-CM | POA: Diagnosis not present

## 2020-05-13 DIAGNOSIS — R351 Nocturia: Secondary | ICD-10-CM

## 2020-05-13 DIAGNOSIS — E039 Hypothyroidism, unspecified: Secondary | ICD-10-CM

## 2020-05-13 DIAGNOSIS — R739 Hyperglycemia, unspecified: Secondary | ICD-10-CM

## 2020-05-13 LAB — COMPREHENSIVE METABOLIC PANEL
ALT: 16 U/L (ref 0–53)
AST: 13 U/L (ref 0–37)
Albumin: 4.4 g/dL (ref 3.5–5.2)
Alkaline Phosphatase: 44 U/L (ref 39–117)
BUN: 12 mg/dL (ref 6–23)
CO2: 30 mEq/L (ref 19–32)
Calcium: 9.1 mg/dL (ref 8.4–10.5)
Chloride: 104 mEq/L (ref 96–112)
Creatinine, Ser: 1.06 mg/dL (ref 0.40–1.50)
GFR: 73.27 mL/min (ref >60.00)
Glucose, Bld: 103 mg/dL — ABNORMAL HIGH (ref 70–99)
Potassium: 3.6 mEq/L (ref 3.5–5.1)
Sodium: 140 mEq/L (ref 135–145)
Total Bilirubin: 0.9 mg/dL (ref 0.2–1.2)
Total Protein: 6.4 g/dL (ref 6.0–8.3)

## 2020-05-13 LAB — CBC
HCT: 40 % (ref 39.0–52.0)
Hemoglobin: 13.9 g/dL (ref 13.0–17.0)
MCHC: 34.7 g/dL (ref 30.0–36.0)
MCV: 83.7 fl (ref 78.0–100.0)
Platelets: 174 10*3/uL (ref 150.0–400.0)
RBC: 4.78 Mil/uL (ref 4.22–5.81)
RDW: 13.5 % (ref 11.5–15.5)
WBC: 5 10*3/uL (ref 4.0–10.5)

## 2020-05-13 LAB — LIPID PANEL
Cholesterol: 163 mg/dL (ref 0–200)
HDL: 43.4 mg/dL (ref >39.00)
LDL Cholesterol: 102 mg/dL — ABNORMAL HIGH (ref 0–99)
NonHDL: 119.61
Total CHOL/HDL Ratio: 4
Triglycerides: 88 mg/dL (ref 0.0–149.0)
VLDL: 17.6 mg/dL (ref 0.0–40.0)

## 2020-05-13 LAB — TSH: TSH: 3.34 u[IU]/mL (ref 0.35–4.50)

## 2020-05-13 LAB — VITAMIN D 25 HYDROXY (VIT D DEFICIENCY, FRACTURES): VITD: 32.89 ng/mL (ref 30.00–100.00)

## 2020-05-13 LAB — HEMOGLOBIN A1C: Hgb A1c MFr Bld: 5.3 % (ref 4.6–6.5)

## 2020-05-13 LAB — PSA: PSA: 0.68 ng/mL (ref 0.10–4.00)

## 2020-05-13 NOTE — Progress Notes (Signed)
Virtual Visit via Video Note  I connected with Cambell Stanek on 05/13/20 at  8:40 AM EDT by a video enabled telemedicine application and verified that I am speaking with the correct person using two identifiers.  Location: Patient: home, patient and provider in visit Provider: home   I discussed the limitations of evaluation and management by telemedicine and the availability of in person appointments. The patient expressed understanding and agreed to proceed. Kem Boroughs, CMA was able to get the patient set up on a video visit    Subjective:    Patient ID: Tyler Schmidt, male    DOB: [birth year], 54 y.o.   MRN: [id number]  No chief complaint on file.   HPI Patient is in today for follow up on chronic medical concerns. No recent febrile illness or hospitalizations. He reports he feels better now than he has in many years. He is staying very active and eating well. Denies CP/palp/SOB/HA/congestion/fevers/GI or GU c/o. Taking meds as prescribed  Past Medical History:  Diagnosis Date  . Adrenal adenoma, left    Incidentaloma noted on noncontrast chest CT done to f/u lung nodule seen on chest radiograph (10/2016).  . Allergy   . Anxiety 08/12/2012  . Atypical chest pain Fall 2013   Stress echo NORMAL 08/30/12  . Barrett esophagus 2012  . GERD (gastroesophageal reflux disease)   . Hyperlipidemia    HDL high, LDL high: simvastatin resulted in prolonged/severe muscle pain, led to long w/u and pt wants to avoid further statin unless lipids severely worsen  . Neck pain 08/12/2012  . Nephrolithiasis 10/2016   Bilat, nonobstruct, noted on CT chest done for f/u of pulm nodule seen on chest radiograph  . Palpitations Fall/winter 2017   Improved with lopressor  . Subclinical hypothyroidism    Patient give hx of hypfunctioning goiter in the distant past, says he was given pills to "kill" the goiter.  Says bx of goiter was benign.    Past Surgical History:  Procedure  Laterality Date  . 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 CARDIOVASCULAR STRESS TEST  08/2012   Stress echo normal.  Pt also reports normal ETT 09/2016.  Marland Kitchen ESOPHAGOGASTRODUODENOSCOPY  09/2011   Dr. Olevia Perches  . HOLTER MONITOR  10/2016   No pathologic arrhythmias.  Occ PVCs that occ corresponded to his palpitations.      Family History  Problem Relation Age of Onset  . Heart disease Father        Died of MI age 10, possible PE  . Heart disease Paternal Uncle        MI's in his 29s  . Heart disease Paternal Uncle   . Colon cancer Neg Hx     Social History   Socioeconomic History  . Marital status: Married    Spouse name: Not on file  . Number of children: 2  . Years of education: Not on file  . Highest education level: Not on file  Occupational History    Employer: [employer]    Comment: Pilot  Tobacco Use  . Smoking status: Former Smoker    Types: Cigarettes    Quit date: 08/18/1979    Years since quitting: 40.7  . Smokeless tobacco: Former Systems developer    Types: Chew  . Tobacco comment: has not used since age 60  Substance and Sexual Activity  . Alcohol use: No    Alcohol/week: 0.0 standard drinks  . Drug  use: No  . Sexual activity: Yes  Other Topics Concern  . Not on file  Social History Narrative   Married, boy/girl twins age 49yrs.   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   Originally from [place].   No T/A/Ds.   Cardiovasc exercise regularly.  Prudent diet.         Social Determinants of Health   Financial Resource Strain:   . Difficulty of Paying Living Expenses:   Food Insecurity:   . Worried About Charity fundraiser in the Last Year:   . Arboriculturist in the Last Year:   Transportation Needs:   . Film/video editor (Medical):   Marland Kitchen Lack of Transportation (Non-Medical):   Physical Activity:   . Days of Exercise per Week:   . Minutes of Exercise per Session:   Stress:   . Feeling of Stress :   Social  Connections:   . Frequency of Communication with Friends and Family:   . Frequency of Social Gatherings with Friends and Family:   . Attends Religious Services:   . Active Member of Clubs or Organizations:   . Attends Archivist Meetings:   Marland Kitchen Marital Status:   Intimate Partner Violence:   . Fear of Current or Ex-Partner:   . Emotionally Abused:   Marland Kitchen Physically Abused:   . Sexually Abused:     Outpatient Medications Prior to Visit  Medication Sig Dispense Refill  . aspirin EC 81 MG tablet Take 1 tablet (81 mg total) by mouth daily. (Patient not taking: Reported on 12/13/2019) 90 tablet 3  . Calcium Citrate-Vitamin D 200-250 MG-UNIT TABS Take 1 tablet by mouth daily.    . hydroxychloroquine (PLAQUENIL) 200 MG tablet Take by mouth. patient take 1 tablet bid x 5 days a week    . levothyroxine (SYNTHROID) 50 MCG tablet TAKE 1 TABLET BY MOUTH EVERY DAY 90 tablet 3  . metoprolol tartrate (LOPRESSOR) 25 MG tablet Take 1 tablet (25 mg total) by mouth 2 (two) times daily. 180 tablet 1  . Omega-3 Fatty Acids (FISH OIL) 1000 MG CAPS Take 1 capsule by mouth daily.    Marland Kitchen omeprazole (PRILOSEC) 20 MG capsule TAKE 1 CAPSULE BY MOUTH TWICE A DAY 180 capsule 1  . rosuvastatin (CRESTOR) 10 MG tablet TAKE 1 TABLET BY MOUTH EVERY DAY 90 tablet 1  . TURMERIC PO Take 2,000 mg by mouth daily.    Marland Kitchen Ubiquinol (QUNOL COQ10/UBIQUINOL/MEGA) 100 MG CAPS Take 1 capsule by mouth daily.     No facility-administered medications prior to visit.    Allergies  Allergen Reactions  . Simvastatin Other (See Comments)    myalgias  . Atorvastatin Other (See Comments)    Joint pain and low back pain    Review of Systems  Constitutional: Negative for fever and malaise/fatigue.  HENT: Negative for congestion.   Eyes: Negative for blurred vision.  Respiratory: Negative for shortness of breath.   Cardiovascular: Negative for chest pain, palpitations and leg swelling.  Gastrointestinal: Negative for abdominal  pain, blood in stool and nausea.  Genitourinary: Negative for dysuria and frequency.  Musculoskeletal: Negative for falls.  Skin: Negative for rash.  Neurological: Negative for dizziness, loss of consciousness and headaches.  Endo/Heme/Allergies: Negative for environmental allergies.  Psychiatric/Behavioral: Negative for depression. The patient is not nervous/anxious.        Objective:    Physical Exam Constitutional:      Appearance: Normal appearance. He is not ill-appearing.  HENT:  Head: Normocephalic and atraumatic.     Right Ear: External ear normal.     Left Ear: External ear normal.     Nose: Nose normal.  Eyes:     General:        Right eye: No discharge.        Left eye: No discharge.  Pulmonary:     Effort: Pulmonary effort is normal.  Neurological:     Mental Status: He is alert and oriented to person, place, and time.  Psychiatric:        Behavior: Behavior normal.     There were no vitals taken for this visit. Wt Readings from Last 3 Encounters:  12/13/19 159 lb (72.1 kg)  10/23/19 165 lb (74.8 kg)  03/03/19 159 lb 8 oz (72.3 kg)    Diabetic Foot Exam - Simple   No data filed     Lab Results  Component Value Date   WBC 5.0 05/13/2020   HGB 13.9 05/13/2020   HCT 40.0 05/13/2020   PLT 174.0 05/13/2020   GLUCOSE 103 (H) 05/13/2020   CHOL 163 05/13/2020   TRIG 88.0 05/13/2020   HDL 43.40 05/13/2020   LDLDIRECT 172.0 12/17/2016   LDLCALC 102 (H) 05/13/2020   ALT 16 05/13/2020   AST 13 05/13/2020   NA 140 05/13/2020   K 3.6 05/13/2020   CL 104 05/13/2020   CREATININE 1.06 05/13/2020   BUN 12 05/13/2020   CO2 30 05/13/2020   TSH 3.34 05/13/2020   PSA 0.68 05/13/2020   HGBA1C 5.3 05/13/2020    Lab Results  Component Value Date   TSH 3.34 05/13/2020   Lab Results  Component Value Date   WBC 5.0 05/13/2020   HGB 13.9 05/13/2020   HCT 40.0 05/13/2020   MCV 83.7 05/13/2020   PLT 174.0 05/13/2020   Lab Results  Component Value Date    NA 140 05/13/2020   K 3.6 05/13/2020   CO2 30 05/13/2020   GLUCOSE 103 (H) 05/13/2020   BUN 12 05/13/2020   CREATININE 1.06 05/13/2020   BILITOT 0.9 05/13/2020   ALKPHOS 44 05/13/2020   AST 13 05/13/2020   ALT 16 05/13/2020   PROT 6.4 05/13/2020   ALBUMIN 4.4 05/13/2020   CALCIUM 9.1 05/13/2020   ANIONGAP 8 08/01/2018   GFR 73.27 05/13/2020   Lab Results  Component Value Date   CHOL 163 05/13/2020   Lab Results  Component Value Date   HDL 43.40 05/13/2020   Lab Results  Component Value Date   LDLCALC 102 (H) 05/13/2020   Lab Results  Component Value Date   TRIG 88.0 05/13/2020   Lab Results  Component Value Date   CHOLHDL 4 05/13/2020   Lab Results  Component Value Date   HGBA1C 5.3 05/13/2020       Assessment & Plan:   Problem List Items Addressed This Visit    RESOLVED: Subclinical hypothyroidism - Primary   Dyslipidemia    Encouraged heart healthy diet, increase exercise, avoid trans fats, consider a krill oil cap daily      Relevant Orders   Comprehensive metabolic panel (Completed)   Lipid panel (Completed)   GERD (gastroesophageal reflux disease)    Avoid offending foods, start probiotics. Do not eat large meals in late evening and consider raising head of bed.       Relevant Orders   CBC (Completed)   Hypothyroid    On Levothyroxine, continue to monitor. He reports feeling much better since starting  Levothyroxine.       Relevant Orders   TSH (Completed)   ANA positive   Rheumatoid arthritis ([place])    Following with rheumatology, doing well on Hydroxychloroquine       Elevated transaminase level    resolved      Vitamin D deficiency    Supplement and monitor      Low vitamin D level   Relevant Orders   Vitamin D (25 hydroxy) (Completed)   Nocturia    Check PSA      Relevant Orders   PSA (Completed)      I am having Clide Cliff "Wille Glaser" maintain his Ubiquinol, TURMERIC PO, hydroxychloroquine, aspirin EC,  levothyroxine, Fish Oil, Calcium Citrate-Vitamin D, omeprazole, rosuvastatin, and metoprolol tartrate.  No orders of the defined types were placed in this encounter.   I discussed the assessment and treatment plan with the patient. The patient was provided an opportunity to ask questions and all were answered. The patient agreed with the plan and demonstrated an understanding of the instructions.   The patient was advised to call back or seek an in-person evaluation if the symptoms worsen or if the condition fails to improve as anticipated.  I provided 25 minutes of non-face-to-face time during this encounter.   Penni Homans, MD

## 2020-05-13 NOTE — Assessment & Plan Note (Signed)
Supplement and monitor 

## 2020-05-13 NOTE — Assessment & Plan Note (Signed)
Following with rheumatology, doing well on Hydroxychloroquine

## 2020-05-13 NOTE — Assessment & Plan Note (Signed)
Check PSA. ?

## 2020-05-13 NOTE — Assessment & Plan Note (Signed)
resolved 

## 2020-05-13 NOTE — Assessment & Plan Note (Signed)
On Levothyroxine, continue to monitor. He reports feeling much better since starting Levothyroxine.

## 2020-05-13 NOTE — Assessment & Plan Note (Signed)
Encouraged heart healthy diet, increase exercise, avoid trans fats, consider a krill oil cap daily 

## 2020-05-13 NOTE — Assessment & Plan Note (Signed)
Avoid offending foods, start probiotics. Do not eat large meals in late evening and consider raising head of bed.  

## 2020-06-04 DIAGNOSIS — Z79899 Other long term (current) drug therapy: Secondary | ICD-10-CM | POA: Diagnosis not present

## 2020-06-04 DIAGNOSIS — M0609 Rheumatoid arthritis without rheumatoid factor, multiple sites: Secondary | ICD-10-CM | POA: Diagnosis not present

## 2020-08-28 DIAGNOSIS — M0579 Rheumatoid arthritis with rheumatoid factor of multiple sites without organ or systems involvement: Secondary | ICD-10-CM | POA: Diagnosis not present

## 2020-08-28 DIAGNOSIS — E559 Vitamin D deficiency, unspecified: Secondary | ICD-10-CM | POA: Diagnosis not present

## 2020-08-28 DIAGNOSIS — M069 Rheumatoid arthritis, unspecified: Secondary | ICD-10-CM | POA: Diagnosis not present

## 2020-08-28 DIAGNOSIS — Z79899 Other long term (current) drug therapy: Secondary | ICD-10-CM | POA: Diagnosis not present

## 2020-08-28 DIAGNOSIS — M19042 Primary osteoarthritis, left hand: Secondary | ICD-10-CM | POA: Diagnosis not present

## 2020-08-28 DIAGNOSIS — M19041 Primary osteoarthritis, right hand: Secondary | ICD-10-CM | POA: Diagnosis not present

## 2020-09-29 ENCOUNTER — Other Ambulatory Visit: Payer: Self-pay | Admitting: Family Medicine

## 2020-11-12 ENCOUNTER — Ambulatory Visit (INDEPENDENT_AMBULATORY_CARE_PROVIDER_SITE_OTHER): Payer: BC Managed Care – PPO | Admitting: Family Medicine

## 2020-11-12 ENCOUNTER — Other Ambulatory Visit: Payer: Self-pay

## 2020-11-12 ENCOUNTER — Encounter: Payer: Self-pay | Admitting: Family Medicine

## 2020-11-12 VITALS — BP 108/66 | HR 68 | Temp 98.3°F | Resp 16 | Ht 72.0 in | Wt 160.8 lb

## 2020-11-12 DIAGNOSIS — M069 Rheumatoid arthritis, unspecified: Secondary | ICD-10-CM

## 2020-11-12 DIAGNOSIS — E559 Vitamin D deficiency, unspecified: Secondary | ICD-10-CM | POA: Diagnosis not present

## 2020-11-12 DIAGNOSIS — Z0000 Encounter for general adult medical examination without abnormal findings: Secondary | ICD-10-CM

## 2020-11-12 DIAGNOSIS — Z23 Encounter for immunization: Secondary | ICD-10-CM | POA: Diagnosis not present

## 2020-11-12 DIAGNOSIS — E039 Hypothyroidism, unspecified: Secondary | ICD-10-CM

## 2020-11-12 DIAGNOSIS — R5383 Other fatigue: Secondary | ICD-10-CM

## 2020-11-12 DIAGNOSIS — R002 Palpitations: Secondary | ICD-10-CM

## 2020-11-12 DIAGNOSIS — R7401 Elevation of levels of liver transaminase levels: Secondary | ICD-10-CM

## 2020-11-12 DIAGNOSIS — K219 Gastro-esophageal reflux disease without esophagitis: Secondary | ICD-10-CM | POA: Diagnosis not present

## 2020-11-12 DIAGNOSIS — E785 Hyperlipidemia, unspecified: Secondary | ICD-10-CM | POA: Diagnosis not present

## 2020-11-12 DIAGNOSIS — Z1211 Encounter for screening for malignant neoplasm of colon: Secondary | ICD-10-CM

## 2020-11-12 MED ORDER — FAMOTIDINE 40 MG PO TABS
40.0000 mg | ORAL_TABLET | Freq: Every evening | ORAL | 5 refills | Status: DC | PRN
Start: 2020-11-12 — End: 2021-07-02

## 2020-11-12 MED ORDER — METOPROLOL TARTRATE 25 MG PO TABS: 25.0000 mg | ORAL_TABLET | Freq: Every day | ORAL | 1 refills | Status: DC

## 2020-11-12 NOTE — Assessment & Plan Note (Signed)
Avoid offending foods, start probiotics. Do not eat large meals in late evening and consider raising head of bed. Continues to take the Omeprazole daily. Will try going to every other day and will try Famotidine 40 mg qhs

## 2020-11-12 NOTE — Assessment & Plan Note (Signed)
Asymptomatic with metoprolol

## 2020-11-12 NOTE — Patient Instructions (Addendum)
Shingrix is the new shingles 2 shots over 2-6 months at the pharmacy or check with insurance regarding coverage and make a nurse appt here for shot if you would like Preventive Care 53-53 Years Old, Male Preventive care refers to lifestyle choices and visits with your health care provider that can promote health and wellness. This includes:  A yearly physical exam. This is also called an annual wellness visit.  Regular dental and eye exams.  Immunizations.  Screening for certain conditions.  Healthy lifestyle choices, such as: ? Eating a healthy diet. ? Getting regular exercise. ? Not using drugs or products that contain nicotine and tobacco. ? Limiting alcohol use. What can I expect for my preventive care visit? Physical exam Your health care provider will check your:  Height and weight. These may be used to calculate your BMI (body mass index). BMI is a measurement that tells if you are at a healthy weight.  Heart rate and blood pressure.  Body temperature.  Skin for abnormal spots. Counseling Your health care provider may ask you questions about your:  Past medical problems.  Family's medical history.  Alcohol, tobacco, and drug use.  Emotional well-being.  Home life and relationship well-being.  Sexual activity.  Diet, exercise, and sleep habits.  Work and work Statistician.  Access to firearms. What immunizations do I need? Vaccines are usually given at various ages, according to a schedule. Your health care provider will recommend vaccines for you based on your age, medical history, and lifestyle or other factors, such as travel or where you work.   What tests do I need? Blood tests  Lipid and cholesterol levels. These may be checked every 5 years, or more often if you are over 50 years old.  Hepatitis C test.  Hepatitis B test. Screening  Lung cancer screening. You may have this screening every year starting at age 6 if you have a 30-pack-year  history of smoking and currently smoke or have quit within the past 15 years.  Prostate cancer screening. Recommendations will vary depending on your family history and other risks.  Genital exam to check for testicular cancer or hernias.  Colorectal cancer screening. ? All adults should have this screening starting at age 46 and continuing until age 76. ? Your health care provider may recommend screening at age 46 if you are at increased risk. ? You will have tests every 1-10 years, depending on your results and the type of screening test.  Diabetes screening. ? This is done by checking your blood sugar (glucose) after you have not eaten for a while (fasting). ? You may have this done every 1-3 years.  STD (sexually transmitted disease) testing, if you are at risk. Follow these instructions at home: Eating and drinking  Eat a diet that includes fresh fruits and vegetables, whole grains, lean protein, and low-fat dairy products.  Take vitamin and mineral supplements as recommended by your health care provider.  Do not drink alcohol if your health care provider tells you not to drink.  If you drink alcohol: ? Limit how much you have to 0-2 drinks a day. ? Be aware of how much alcohol is in your drink. In the [place] one drink equals one 12 oz bottle of beer (355 mL), one 5 oz glass of wine (148 mL), or one 1 oz glass of hard liquor (44 mL).   Lifestyle  Take daily care of your teeth and gums. Brush your teeth every morning and night with fluoride  toothpaste. Floss one time each day.  Stay active. Exercise for at least 30 minutes 5 or more days each week.  Do not use any products that contain nicotine or tobacco, such as cigarettes, e-cigarettes, and chewing tobacco. If you need help quitting, ask your health care provider.  Do not use drugs.  If you are sexually active, practice safe sex. Use a condom or other form of protection to prevent STIs (sexually transmitted  infections).  If told by your health care provider, take low-dose aspirin daily starting at age 56.  Find healthy ways to cope with stress, such as: ? Meditation, yoga, or listening to music. ? Journaling. ? Talking to a trusted person. ? Spending time with friends and family. Safety  Always wear your seat belt while driving or riding in a vehicle.  Do not drive: ? If you have been drinking alcohol. Do not ride with someone who has been drinking. ? When you are tired or distracted. ? While texting.  Wear a helmet and other protective equipment during sports activities.  If you have firearms in your house, make sure you follow all gun safety procedures. What's next?  Go to your health care provider once a year for an annual wellness visit.  Ask your health care provider how often you should have your eyes and teeth checked.  Stay up to date on all vaccines. This information is not intended to replace advice given to you by your health care provider. Make sure you discuss any questions you have with your health care provider. Document Revised: 07/18/2019 Document Reviewed: 10/13/2018 Elsevier Patient Education  2021 [employer].

## 2020-11-12 NOTE — Assessment & Plan Note (Signed)
Supplement and monitor 

## 2020-11-12 NOTE — Assessment & Plan Note (Signed)
Is now being managed at [facility], was seeing Dr Lurena Joiner but he has left now, he will follow up with them. Hydroxychloroquine is helping him feel better

## 2020-11-12 NOTE — Assessment & Plan Note (Signed)
Patient encouraged to maintain heart healthy diet, regular exercise, adequate sleep. Consider daily probiotics. Take medications as prescribed. Labs ordered and reviewed 

## 2020-11-12 NOTE — Progress Notes (Deleted)
imm

## 2020-11-13 NOTE — Assessment & Plan Note (Signed)
On Levothyroxine, continue to monitor 

## 2020-11-13 NOTE — Progress Notes (Signed)
Subjective:    Patient ID: Tyler Schmidt, male    DOB: 1966/06/01, 55 y.o.   MRN: [id number]  Chief Complaint  Patient presents with  . Annual Exam    HPI Patient is in today for annual preventative exam and follow up on chronic medical concerns. No acute concerns. His rheumatoid arthritis is much better on current treatment. His reflux is well controlled but he is interested in altering medications. He stays active and eats a heart healthy diet. Denies CP/palp/SOB/HA/congestion/fevers/GI or GU c/o. Taking meds as prescribed  Past Medical History:  Diagnosis Date  . Adrenal adenoma, left    Incidentaloma noted on noncontrast chest CT done to f/u lung nodule seen on chest radiograph (10/2016).  . Allergy   . Anxiety 08/12/2012  . Atypical chest pain Fall 2013   Stress echo NORMAL 08/30/12  . Barrett esophagus 2012  . GERD (gastroesophageal reflux disease)   . Hyperlipidemia    HDL high, LDL high: simvastatin resulted in prolonged/severe muscle pain, led to long w/u and pt wants to avoid further statin unless lipids severely worsen  . Neck pain 08/12/2012  . Nephrolithiasis 10/2016   Bilat, nonobstruct, noted on CT chest done for f/u of pulm nodule seen on chest radiograph  . Palpitations Fall/winter 2017   Improved with lopressor  . Subclinical hypothyroidism    Patient give hx of hypfunctioning goiter in the distant past, says he was given pills to "kill" the goiter.  Says bx of goiter was benign.    Past Surgical History:  Procedure Laterality Date  . 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 Doreatha Lew)  . CARDIOVASCULAR STRESS TEST  08/2012   Stress echo normal.  Pt also reports normal ETT 09/2016.  Marland Kitchen ESOPHAGOGASTRODUODENOSCOPY  09/2011   Dr. Olevia Perches  . HOLTER MONITOR  10/2016   No pathologic arrhythmias.  Occ PVCs that occ corresponded to his palpitations.      Family History  Problem Relation Age of Onset  . Heart  disease Father        Died of MI age 3, possible PE  . Heart disease Paternal Uncle        MI's in his 75s  . Heart disease Paternal Uncle   . Colon cancer Neg Hx     Social History   Socioeconomic History  . Marital status: Married    Spouse name: Not on file  . Number of children: 2  . Years of education: Not on file  . Highest education level: Not on file  Occupational History    Employer: [employer]    Comment: Pilot  Tobacco Use  . Smoking status: Former Smoker    Types: Cigarettes    Quit date: 08/18/1979    Years since quitting: 41.2  . Smokeless tobacco: Former Systems developer    Types: Chew  . Tobacco comment: has not used since age 40  Substance and Sexual Activity  . Alcohol use: No    Alcohol/week: 0.0 standard drinks  . Drug use: No  . Sexual activity: Yes  Other Topics Concern  . Not on file  Social History Narrative   Married, boy/girl twins age 40yrs.   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   Originally from [place].   No T/A/Ds.   Cardiovasc exercise regularly.  Prudent diet.         Social Determinants of Health   Financial Resource Strain: Not on file  Food Insecurity: Not  on file  Transportation Needs: Not on file  Physical Activity: Not on file  Stress: Not on file  Social Connections: Not on file  Intimate Partner Violence: Not on file    Outpatient Medications Prior to Visit  Medication Sig Dispense Refill  . Calcium Citrate-Vitamin D 200-250 MG-UNIT TABS Take 1 tablet by mouth daily.    . hydroxychloroquine (PLAQUENIL) 200 MG tablet Take by mouth. patient take 1 tablet bid x 5 days a week    . levothyroxine (SYNTHROID) 50 MCG tablet TAKE 1 TABLET BY MOUTH EVERY DAY 90 tablet 1  . Omega-3 Fatty Acids (FISH OIL) 1000 MG CAPS Take 1 capsule by mouth daily.    Marland Kitchen omeprazole (PRILOSEC) 20 MG capsule TAKE 1 CAPSULE BY MOUTH TWICE A DAY 180 capsule 1  . rosuvastatin (CRESTOR) 10 MG tablet TAKE 1 TABLET BY MOUTH EVERY DAY 90 tablet 1  . TURMERIC PO Take 2,000 mg  by mouth daily.    Marland Kitchen Ubiquinol 100 MG CAPS Take 1 capsule by mouth daily.    . metoprolol tartrate (LOPRESSOR) 25 MG tablet Take 1 tablet (25 mg total) by mouth 2 (two) times daily. 180 tablet 1  . aspirin EC 81 MG tablet Take 1 tablet (81 mg total) by mouth daily. (Patient not taking: No sig reported) 90 tablet 3   No facility-administered medications prior to visit.    Allergies  Allergen Reactions  . Simvastatin Other (See Comments)    myalgias  . Atorvastatin Other (See Comments)    Joint pain and low back pain    Review of Systems  Constitutional: Negative for chills, fever and malaise/fatigue.  HENT: Negative for congestion and hearing loss.   Eyes: Negative for discharge.  Respiratory: Negative for cough, sputum production and shortness of breath.   Cardiovascular: Negative for chest pain, palpitations and leg swelling.  Gastrointestinal: Positive for heartburn. Negative for abdominal pain, blood in stool, constipation, diarrhea, nausea and vomiting.  Genitourinary: Negative for dysuria, frequency, hematuria and urgency.  Musculoskeletal: Negative for back pain, falls and myalgias.  Skin: Negative for rash.  Neurological: Negative for dizziness, sensory change, loss of consciousness, weakness and headaches.  Endo/Heme/Allergies: Negative for environmental allergies. Does not bruise/bleed easily.  Psychiatric/Behavioral: Negative for depression and suicidal ideas. The patient is not nervous/anxious and does not have insomnia.        Objective:    Physical Exam Vitals and nursing note reviewed.  Constitutional:      General: He is not in acute distress.    Appearance: Normal appearance. He is well-developed and well-nourished. He is not ill-appearing.  HENT:     Head: Normocephalic and atraumatic.     Right Ear: Tympanic membrane and external ear normal. There is no impacted cerumen.     Left Ear: Tympanic membrane and external ear normal. There is no impacted cerumen.      Nose: Nose normal. No congestion or rhinorrhea.  Eyes:     General:        Right eye: No discharge.        Left eye: No discharge.  Cardiovascular:     Rate and Rhythm: Normal rate and regular rhythm.     Heart sounds: No murmur heard.   Pulmonary:     Effort: Pulmonary effort is normal.     Breath sounds: Normal breath sounds.  Abdominal:     General: Bowel sounds are normal.     Palpations: Abdomen is soft. There is no mass.  Tenderness: There is no abdominal tenderness. There is no guarding.  Musculoskeletal:        General: No edema. Normal range of motion.     Cervical back: Normal range of motion and neck supple.  Skin:    General: Skin is warm and dry.  Neurological:     Mental Status: He is alert and oriented to person, place, and time.  Psychiatric:        Mood and Affect: Mood and affect normal.        Behavior: Behavior normal.     BP 108/66   Pulse 68   Temp 98.3 F (36.8 C) (Oral)   Resp 16   Ht 6' (1.829 m)   Wt 160 lb 12.8 oz (72.9 kg)   SpO2 97%   BMI 21.81 kg/m  Wt Readings from Last 3 Encounters:  11/12/20 160 lb 12.8 oz (72.9 kg)  12/13/19 159 lb (72.1 kg)  10/23/19 165 lb (74.8 kg)    Diabetic Foot Exam - Simple   No data filed    Lab Results  Component Value Date   WBC 5.0 05/13/2020   HGB 13.9 05/13/2020   HCT 40.0 05/13/2020   PLT 174.0 05/13/2020   GLUCOSE 103 (H) 05/13/2020   CHOL 163 05/13/2020   TRIG 88.0 05/13/2020   HDL 43.40 05/13/2020   LDLDIRECT 172.0 12/17/2016   LDLCALC 102 (H) 05/13/2020   ALT 16 05/13/2020   AST 13 05/13/2020   NA 140 05/13/2020   K 3.6 05/13/2020   CL 104 05/13/2020   CREATININE 1.06 05/13/2020   BUN 12 05/13/2020   CO2 30 05/13/2020   TSH 3.34 05/13/2020   PSA 0.68 05/13/2020   HGBA1C 5.3 05/13/2020    Lab Results  Component Value Date   TSH 3.34 05/13/2020   Lab Results  Component Value Date   WBC 5.0 05/13/2020   HGB 13.9 05/13/2020   HCT 40.0 05/13/2020   MCV 83.7  05/13/2020   PLT 174.0 05/13/2020   Lab Results  Component Value Date   NA 140 05/13/2020   K 3.6 05/13/2020   CO2 30 05/13/2020   GLUCOSE 103 (H) 05/13/2020   BUN 12 05/13/2020   CREATININE 1.06 05/13/2020   BILITOT 0.9 05/13/2020   ALKPHOS 44 05/13/2020   AST 13 05/13/2020   ALT 16 05/13/2020   PROT 6.4 05/13/2020   ALBUMIN 4.4 05/13/2020   CALCIUM 9.1 05/13/2020   ANIONGAP 8 08/01/2018   GFR 73.27 05/13/2020   Lab Results  Component Value Date   CHOL 163 05/13/2020   Lab Results  Component Value Date   HDL 43.40 05/13/2020   Lab Results  Component Value Date   LDLCALC 102 (H) 05/13/2020   Lab Results  Component Value Date   TRIG 88.0 05/13/2020   Lab Results  Component Value Date   CHOLHDL 4 05/13/2020   Lab Results  Component Value Date   HGBA1C 5.3 05/13/2020       Assessment & Plan:   Problem List Items Addressed This Visit    Hyperlipidemia    Encouraged heart healthy diet, increase exercise, avoid trans fats, consider a krill oil cap daily      Relevant Medications   metoprolol tartrate (LOPRESSOR) 25 MG tablet   GERD (gastroesophageal reflux disease)    Avoid offending foods, start probiotics. Do not eat large meals in late evening and consider raising head of bed. Continues to take the Omeprazole daily. Will try going to every  other day and will try Famotidine 40 mg qhs      Relevant Medications   famotidine (PEPCID) 40 MG tablet   Other Relevant Orders   CBC with Differential/Platelet   Other fatigue   Hypothyroid    On Levothyroxine, continue to monitor      Relevant Medications   metoprolol tartrate (LOPRESSOR) 25 MG tablet   Rheumatoid arthritis ([place])    Is now being managed at [facility], was seeing Dr Lurena Joiner but he has left now, he will follow up with them. Hydroxychloroquine is helping him feel better      Elevated transaminase level   Relevant Orders   Comprehensive metabolic panel   Vitamin D deficiency    Supplement and  monitor      Palpitations - Primary    Asymptomatic with metoprolol      Relevant Orders   TSH   Preventative health care    Patient encouraged to maintain heart healthy diet, regular exercise, adequate sleep. Consider daily probiotics. Take medications as prescribed. Labs ordered and reviewed.       Relevant Orders   PSA    Other Visit Diagnoses    Colon cancer screening       Relevant Orders   Ambulatory referral to Gastroenterology   Need for influenza vaccination       Need for Tdap vaccination       Relevant Orders   Tdap vaccine greater than or equal to 7yo IM (Completed)      I have changed [place] "Jon"'s metoprolol tartrate. I am also having him start on famotidine. Additionally, I am having him maintain his Ubiquinol, TURMERIC PO, hydroxychloroquine, aspirin EC, Fish Oil, Calcium Citrate-Vitamin D, rosuvastatin, levothyroxine, and omeprazole.  Meds ordered this encounter  Medications  . metoprolol tartrate (LOPRESSOR) 25 MG tablet    Sig: Take 1 tablet (25 mg total) by mouth daily.    Dispense:  180 tablet    Refill:  1  . famotidine (PEPCID) 40 MG tablet    Sig: Take 1 tablet (40 mg total) by mouth at bedtime as needed for heartburn or indigestion.    Dispense:  30 tablet    Refill:  5     Penni Homans, MD

## 2020-11-13 NOTE — Assessment & Plan Note (Signed)
Encouraged heart healthy diet, increase exercise, avoid trans fats, consider a krill oil cap daily 

## 2020-12-31 ENCOUNTER — Other Ambulatory Visit: Payer: Self-pay | Admitting: Physician Assistant

## 2021-03-11 NOTE — Progress Notes (Deleted)
Virtual Visit via Video Note   This visit type was conducted due to national recommendations for restrictions regarding the COVID-19 Pandemic (e.g. social distancing) in an effort to limit this patient's exposure and mitigate transmission in our community.  Due to his co-morbid illnesses, this patient is at least at moderate risk for complications without adequate follow up.  This format is felt to be most appropriate for this patient at this time.  All issues noted in this document were discussed and addressed.  A limited physical exam was performed with this format.  Please refer to the patient's chart for his consent to telehealth for [facility].      Date:  03/11/2021   ID:  Tyler Schmidt, DOB [birth year], MRN [id number]  Patient Location:Home Provider Location: Home  PCP:  Mosie Lukes, MD  Cardiologist:  Dr Stanford Breed  Evaluation Performed:  Follow-Up Visit  Chief Complaint:  FU palpitations  History of Present Illness:    FU palpitations. Stress echocardiogram 2009 normal. Stress echocardiogram October 2013 normal. Holter monitor December 2017 showed sinus rhythm with PVCs.Stress echocardiogram at [facility] November 2017 normal.Chest CT December 2017 showed coronary artery calcification.  Since last seen   The patient does not have symptoms concerning for COVID-19 infection (fever, chills, cough, or new shortness of breath).    Past Medical History:  Diagnosis Date  . Adrenal adenoma, left    Incidentaloma noted on noncontrast chest CT done to f/u lung nodule seen on chest radiograph (10/2016).  . Allergy   . Anxiety 08/12/2012  . Atypical chest pain Fall 2013   Stress echo NORMAL 08/30/12  . Barrett esophagus 2012  . GERD (gastroesophageal reflux disease)   . Hyperlipidemia    HDL high, LDL high: simvastatin resulted in prolonged/severe muscle pain, led to long w/u and pt wants to avoid further statin unless lipids severely worsen  . Neck pain  08/12/2012  . Nephrolithiasis 10/2016   Bilat, nonobstruct, noted on CT chest done for f/u of pulm nodule seen on chest radiograph  . Palpitations Fall/winter 2017   Improved with lopressor  . Subclinical hypothyroidism    Patient give hx of hypfunctioning goiter in the distant past, says he was given pills to "kill" the goiter.  Says bx of goiter was benign.   Past Surgical History:  Procedure Laterality Date  . 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 CARDIOVASCULAR STRESS TEST  08/2012   Stress echo normal.  Pt also reports normal ETT 09/2016.  Marland Kitchen ESOPHAGOGASTRODUODENOSCOPY  09/2011   Dr. Olevia Perches  . HOLTER MONITOR  10/2016   No pathologic arrhythmias.  Occ PVCs that occ corresponded to his palpitations.       No outpatient medications have been marked as taking for the 03/20/21 encounter (Appointment) with Lelon Perla, MD.     Allergies:   Simvastatin and Atorvastatin   Social History   Tobacco Use  . Smoking status: Former Smoker    Types: Cigarettes    Quit date: 08/18/1979    Years since quitting: 41.5  . Smokeless tobacco: Former Systems developer    Types: Chew  . Tobacco comment: has not used since age 4  Substance Use Topics  . Alcohol use: No    Alcohol/week: 0.0 standard drinks  . Drug use: No     Family Hx: The patient's family history includes Heart disease in his father, paternal uncle, and paternal uncle. There  is no history of Colon cancer.  ROS:   Please see the history of present illness.    No Fever, chills  or productive cough All other systems reviewed and are negative.  Recent Labs: 05/13/2020: ALT 16; BUN 12; Creatinine, Ser 1.06; Hemoglobin 13.9; Platelets 174.0; Potassium 3.6; Sodium 140; TSH 3.34   Recent Lipid Panel Lab Results  Component Value Date/Time   CHOL 163 05/13/2020 08:17 AM   TRIG 88.0 05/13/2020 08:17 AM   HDL 43.40 05/13/2020 08:17 AM   CHOLHDL 4 05/13/2020 08:17 AM    LDLCALC 102 (H) 05/13/2020 08:17 AM   LDLDIRECT 172.0 12/17/2016 03:46 PM    Wt Readings from Last 3 Encounters:  11/12/20 160 lb 12.8 oz (72.9 kg)  12/13/19 159 lb (72.1 kg)  10/23/19 165 lb (74.8 kg)     Objective:    Vital Signs:  There were no vitals taken for this visit.   VITAL SIGNS:  reviewed NAD Answers questions appropriately Normal affect Remainder of physical examination not performed (telehealth visit; coronavirus pandemic)  ASSESSMENT & PLAN:    1. Palpitations-symptoms are controlled.  Continue metoprolol at present dose. 2. Coronary calcification-continue aspirin and statin.  Patient denies chest pain.  Previous functional study negative. 3. Hyperlipidemia-continue statin.  COVID-19 Education: The importance of social distancing was discussed today.  Time:   Today, I have spent 16 minutes with the patient with telehealth technology discussing the above problems.     Medication Adjustments/Labs and Tests Ordered: Current medicines are reviewed at length with the patient today.  Concerns regarding medicines are outlined above.   Tests Ordered: No orders of the defined types were placed in this encounter.   Medication Changes: No orders of the defined types were placed in this encounter.   Follow Up:  In Person in 1 year(s)  Signed, Kirk Ruths, MD  03/11/2021 2:23 PM    [facility]

## 2021-03-20 ENCOUNTER — Telehealth: Payer: BC Managed Care – PPO | Admitting: Cardiology

## 2021-04-11 ENCOUNTER — Other Ambulatory Visit: Payer: Self-pay | Admitting: Family Medicine

## 2021-04-11 ENCOUNTER — Other Ambulatory Visit: Payer: Self-pay | Admitting: Cardiology

## 2021-04-14 DIAGNOSIS — M25531 Pain in right wrist: Secondary | ICD-10-CM | POA: Diagnosis not present

## 2021-05-20 DIAGNOSIS — M0609 Rheumatoid arthritis without rheumatoid factor, multiple sites: Secondary | ICD-10-CM | POA: Diagnosis not present

## 2021-05-20 DIAGNOSIS — Z79899 Other long term (current) drug therapy: Secondary | ICD-10-CM | POA: Diagnosis not present

## 2021-06-11 DIAGNOSIS — E039 Hypothyroidism, unspecified: Secondary | ICD-10-CM | POA: Diagnosis not present

## 2021-06-11 DIAGNOSIS — Z79899 Other long term (current) drug therapy: Secondary | ICD-10-CM | POA: Diagnosis not present

## 2021-06-11 DIAGNOSIS — M0579 Rheumatoid arthritis with rheumatoid factor of multiple sites without organ or systems involvement: Secondary | ICD-10-CM | POA: Diagnosis not present

## 2021-06-17 ENCOUNTER — Telehealth: Payer: Self-pay | Admitting: *Deleted

## 2021-06-17 MED ORDER — ROSUVASTATIN CALCIUM 10 MG PO TABS: 10.0000 mg | ORAL_TABLET | Freq: Every day | ORAL | 1 refills | Status: DC

## 2021-06-17 NOTE — Telephone Encounter (Signed)
Received a request for rosuvastatin and while reviewing chart saw that patient did not get labs done at [facility] in Jan.  Called patient and advised we need lab work.  He scheduled for tomorrow to do.  He had a TSH and CBC/diff done on 06/11/21 in care everywhere so other orders placed as future.

## 2021-06-17 NOTE — Addendum Note (Signed)
Addended by: Kem Boroughs D on: 06/17/2021 11:03 AM   Modules accepted: Orders

## 2021-06-18 ENCOUNTER — Other Ambulatory Visit: Payer: Self-pay

## 2021-06-18 ENCOUNTER — Other Ambulatory Visit (INDEPENDENT_AMBULATORY_CARE_PROVIDER_SITE_OTHER): Payer: BC Managed Care – PPO

## 2021-06-18 DIAGNOSIS — Z0000 Encounter for general adult medical examination without abnormal findings: Secondary | ICD-10-CM

## 2021-06-18 DIAGNOSIS — R7401 Elevation of levels of liver transaminase levels: Secondary | ICD-10-CM

## 2021-06-18 DIAGNOSIS — E785 Hyperlipidemia, unspecified: Secondary | ICD-10-CM | POA: Diagnosis not present

## 2021-06-18 LAB — COMPREHENSIVE METABOLIC PANEL
ALT: 20 U/L (ref 0–53)
AST: 15 U/L (ref 0–37)
Albumin: 4.5 g/dL (ref 3.5–5.2)
Alkaline Phosphatase: 47 U/L (ref 39–117)
BUN: 16 mg/dL (ref 6–23)
CO2: 29 mEq/L (ref 19–32)
Calcium: 9.5 mg/dL (ref 8.4–10.5)
Chloride: 103 mEq/L (ref 96–112)
Creatinine, Ser: 1.13 mg/dL (ref 0.40–1.50)
GFR: 74.26 mL/min (ref >60.00)
Glucose, Bld: 87 mg/dL (ref 70–99)
Potassium: 4.4 mEq/L (ref 3.5–5.1)
Sodium: 139 mEq/L (ref 135–145)
Total Bilirubin: 1 mg/dL (ref 0.2–1.2)
Total Protein: 6.9 g/dL (ref 6.0–8.3)

## 2021-06-18 LAB — LIPID PANEL
Cholesterol: 182 mg/dL (ref 0–200)
HDL: 49.6 mg/dL (ref >39.00)
LDL Cholesterol: 105 mg/dL — ABNORMAL HIGH (ref 0–99)
NonHDL: 132.75
Total CHOL/HDL Ratio: 4
Triglycerides: 138 mg/dL (ref 0.0–149.0)
VLDL: 27.6 mg/dL (ref 0.0–40.0)

## 2021-06-18 LAB — PSA: PSA: 0.89 ng/mL (ref 0.10–4.00)

## 2021-06-23 NOTE — Progress Notes (Signed)
Virtual Visit via Video Note changed to phone visit at pt request   This visit type was conducted due to national recommendations for restrictions regarding the COVID-19 Pandemic (e.g. social distancing) in an effort to limit this patient's exposure and mitigate transmission in our community.  Due to his co-morbid illnesses, this patient is at least at moderate risk for complications without adequate follow up.  This format is felt to be most appropriate for this patient at this time.  All issues noted in this document were discussed and addressed.  A limited physical exam was performed with this format.  Please refer to the patient's chart for his consent to telehealth for [facility].      Date:  07/02/2021   ID:  Tyler Schmidt, DOB 1966-06-09, MRN [id number]  Patient Location:Home Provider Location: Home  PCP:  Mosie Lukes, MD  Cardiologist:  Dr Stanford Breed  Evaluation Performed:  Follow-Up Visit  Chief Complaint:  FU palpitations  History of Present Illness:    FU palpitations. Stress echocardiogram 2009 normal. Stress echocardiogram October 2013 normal. Holter monitor December 2017 showed sinus rhythm with PVCs. Stress echocardiogram at [facility] November 2017 normal. Chest CT December 2017 showed coronary artery calcification.  Since last seen the patient denies any dyspnea on exertion, orthopnea, PND, pedal edema, palpitations, syncope or chest pain.   The patient does not have symptoms concerning for COVID-19 infection (fever, chills, cough, or new shortness of breath).    Past Medical History:  Diagnosis Date   Adrenal adenoma, left    Incidentaloma noted on noncontrast chest CT done to f/u lung nodule seen on chest radiograph (10/2016).   Allergy    Anxiety 08/12/2012   Atypical chest pain Fall 2013   Stress echo NORMAL 08/30/12   Barrett esophagus 2012   GERD (gastroesophageal reflux disease)    Hyperlipidemia    HDL high, LDL high: simvastatin  resulted in prolonged/severe muscle pain, led to long w/u and pt wants to avoid further statin unless lipids severely worsen   Neck pain 08/12/2012   Nephrolithiasis 10/2016   Bilat, nonobstruct, noted on CT chest done for f/u of pulm nodule seen on chest radiograph   Palpitations Fall/winter 2017   Improved with lopressor   Subclinical hypothyroidism    Patient give hx of hypfunctioning goiter in the distant past, says he was given pills to "kill" the goiter.  Says bx of goiter was benign.   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  Pt also reports normal ETT 09/2016.   ESOPHAGOGASTRODUODENOSCOPY  09/2011   Dr. Donnajean Lopes MONITOR  10/2016   No pathologic arrhythmias.  Occ PVCs that occ corresponded to his palpitations.       Current Meds  Medication Sig   Calcium Citrate-Vitamin D 200-250 MG-UNIT TABS Take 1 tablet by mouth daily.   Cholecalciferol (VITAMIN D-3 PO) Take 2,000 Int'l Units/1.61m by mouth daily.   hydroxychloroquine (PLAQUENIL) 200 MG tablet Take by mouth. patient take 1 tablet bid x 5 days a week   levothyroxine (SYNTHROID) 50 MCG tablet Take 1 tablet (50 mcg total) by mouth daily before breakfast.   metoprolol tartrate (LOPRESSOR) 25 MG tablet TAKE 1 TABLET BY MOUTH TWICE A DAY (Patient taking differently: Take 25 mg by mouth daily.)   Multiple Vitamins-Minerals (  ZINC PO) Take 1 capsule by mouth daily.   Omega-3 Fatty Acids (FISH OIL) 1000 MG CAPS Take 1 capsule by mouth daily.   omeprazole (PRILOSEC) 20 MG capsule TAKE 1 CAPSULE BY MOUTH TWICE A DAY   rosuvastatin (CRESTOR) 10 MG tablet Take 1 tablet (10 mg total) by mouth daily. (Patient taking differently: Take 10 mg by mouth. Monday,Wednesday and friday)   TURMERIC PO Take 2,000 mg by mouth daily.   Ubiquinol 100 MG CAPS Take 1 capsule by mouth  daily.     Allergies:   Simvastatin and Atorvastatin   Social History   Tobacco Use   Smoking status: Former    Types: Cigarettes    Quit date: 08/18/1979    Years since quitting: 41.9   Smokeless tobacco: Former    Types: Chew   Tobacco comments:    has not used since age 51  Substance Use Topics   Alcohol use: No    Alcohol/week: 0.0 standard drinks   Drug use: No     Family Hx: The patient's family history includes Heart disease in his father, paternal uncle, and paternal uncle. There is no history of Colon cancer.  ROS:   Please see the history of present illness.    No Fever, chills  or productive cough All other systems reviewed and are negative.  Recent Labs: 06/18/2021: ALT 20; BUN 16; Creatinine, Ser 1.13; Potassium 4.4; Sodium 139   Recent Lipid Panel Lab Results  Component Value Date/Time   CHOL 182 06/18/2021 09:34 AM   TRIG 138.0 06/18/2021 09:34 AM   HDL 49.60 06/18/2021 09:34 AM   CHOLHDL 4 06/18/2021 09:34 AM   LDLCALC 105 (H) 06/18/2021 09:34 AM   LDLDIRECT 172.0 12/17/2016 03:46 PM    Wt Readings from Last 3 Encounters:  07/02/21 162 lb (73.5 kg)  11/12/20 160 lb 12.8 oz (72.9 kg)  12/13/19 159 lb (72.1 kg)     Objective:    Vital Signs:  Ht '6\' 1"'$  (1.854 m)   Wt 162 lb (73.5 kg)   BMI 21.37 kg/m    VITAL SIGNS:  reviewed NAD Answers questions appropriately Normal affect Remainder of physical examination not performed (telehealth visit; coronavirus pandemic)  ASSESSMENT & PLAN:    Palpitations-symptoms are controlled.  Continue metoprolol at present dose. Coronary calcification-continue aspirin and statin. Hyperlipidemia-laboratories from August 2022 personally reviewed.  Total cholesterol 182 with LDL 105.  COVID-19 Education: The importance of social distancing was discussed today.  Time:   Today, I have spent 16 minutes with the patient with telehealth technology discussing the above problems.     Medication  Adjustments/Labs and Tests Ordered: Current medicines are reviewed at length with the patient today.  Concerns regarding medicines are outlined above.   Tests Ordered: No orders of the defined types were placed in this encounter.   Medication Changes: No orders of the defined types were placed in this encounter.   Follow Up:  In Person in 1 year(s)  Signed, Kirk Ruths, MD  07/02/2021 7:59 AM    [place]

## 2021-07-02 ENCOUNTER — Encounter: Payer: Self-pay | Admitting: Cardiology

## 2021-07-02 ENCOUNTER — Telehealth (INDEPENDENT_AMBULATORY_CARE_PROVIDER_SITE_OTHER): Payer: BC Managed Care – PPO | Admitting: Cardiology

## 2021-07-02 ENCOUNTER — Other Ambulatory Visit: Payer: Self-pay

## 2021-07-02 VITALS — Ht 73.0 in | Wt 162.0 lb

## 2021-07-02 DIAGNOSIS — E785 Hyperlipidemia, unspecified: Secondary | ICD-10-CM

## 2021-07-02 DIAGNOSIS — R002 Palpitations: Secondary | ICD-10-CM

## 2021-07-02 DIAGNOSIS — I2510 Atherosclerotic heart disease of native coronary artery without angina pectoris: Secondary | ICD-10-CM

## 2021-07-02 MED ORDER — ROSUVASTATIN CALCIUM 10 MG PO TABS: 10.0000 mg | ORAL_TABLET | Freq: Every day | ORAL | 3 refills | Status: DC

## 2021-07-02 NOTE — Patient Instructions (Signed)
Medication Instructions:   INCREASE ROSUVASTATIN TO 10 MG ONCE DAILY  *If you need a refill on your cardiac medications before your next appointment, please call your pharmacy*   Lab Work:  Your physician recommends that you return for lab work in: [place]  If you have labs (blood work) drawn today and your tests are completely normal, you will receive your results only by: [place] (if you have MyChart) OR A paper copy in the mail If you have any lab test that is abnormal or we need to change your treatment, we will call you to review the results.   Follow-Up: At [facility], you and your health needs are our priority.  As part of our continuing mission to provide you with exceptional heart care, we have created designated Provider Care Teams.  These Care Teams include your primary Cardiologist (physician) and Advanced Practice Providers (APPs -  Physician Assistants and Nurse Practitioners) who all work together to provide you with the care you need, when you need it.  We recommend signing up for the patient portal called "MyChart".  Sign up information is provided on this After Visit Summary.  MyChart is used to connect with patients for Virtual Visits (Telemedicine).  Patients are able to view lab/test results, encounter notes, upcoming appointments, etc.  Non-urgent messages can be sent to your provider as well.   To learn more about what you can do with MyChart, go to [email or web address].    Your next appointment:   12 month(s)  The format for your next appointment:   In Person  Provider:   Kirk Ruths, MD

## 2021-07-03 DIAGNOSIS — R070 Pain in throat: Secondary | ICD-10-CM | POA: Diagnosis not present

## 2021-07-03 DIAGNOSIS — K219 Gastro-esophageal reflux disease without esophagitis: Secondary | ICD-10-CM | POA: Diagnosis not present

## 2021-07-30 ENCOUNTER — Encounter: Payer: Self-pay | Admitting: Gastroenterology

## 2021-07-30 ENCOUNTER — Ambulatory Visit (INDEPENDENT_AMBULATORY_CARE_PROVIDER_SITE_OTHER): Payer: BC Managed Care – PPO | Admitting: Gastroenterology

## 2021-07-30 VITALS — BP 134/68 | HR 73 | Ht 73.0 in | Wt 164.0 lb

## 2021-07-30 DIAGNOSIS — Z1211 Encounter for screening for malignant neoplasm of colon: Secondary | ICD-10-CM | POA: Diagnosis not present

## 2021-07-30 DIAGNOSIS — K219 Gastro-esophageal reflux disease without esophagitis: Secondary | ICD-10-CM

## 2021-07-30 DIAGNOSIS — R6889 Other general symptoms and signs: Secondary | ICD-10-CM

## 2021-07-30 DIAGNOSIS — R0989 Other specified symptoms and signs involving the circulatory and respiratory systems: Secondary | ICD-10-CM

## 2021-07-30 MED ORDER — SUTAB 1479-225-188 MG PO TABS: 1.0000 | ORAL_TABLET | Freq: Once | ORAL | 0 refills | Status: AC

## 2021-07-30 NOTE — Patient Instructions (Addendum)
If you are age 53 or older, your body mass index should be between 23-30. Your Body mass index is 21.64 kg/m. If this is out of the aforementioned range listed, please consider follow up with your Primary Care Provider.  If you are age 76 or younger, your body mass index should be between 19-25. Your Body mass index is 21.64 kg/m. If this is out of the aformentioned range listed, please consider follow up with your Primary Care Provider.   __________________________________________________________  The [facility] providers would like to encourage you to use [facility] to communicate with providers for non-urgent requests or questions.  Due to long hold times on the telephone, sending your provider a message by [facility] may be a faster and more efficient way to get a response.  Please allow 48 business hours for a response.  Please remember that this is for non-urgent requests.   You have been scheduled for an endoscopy and colonoscopy. Please follow the written instructions given to you at your visit today. Please pick up your prep supplies at the pharmacy within the next 1-3 days. If you use inhalers (even only as needed), please bring them with you on the day of your procedure.  Thank you for entrusting me with your care and for choosing [facility], Dr. [place] Cellar

## 2021-07-30 NOTE — Progress Notes (Addendum)
HPI :  55 year old male with a history of GERD and suspected Barrett's esophagus, rheumatoid arthritis, Hypothyroidism, referred by Vivien Rossetti to reestablish his GI care with [place] for GERD and to discuss colon cancer screening.  I have not seen him since September 2016.  At that time he had an EGD where he had a suspected short segment of Barrett's esophagus.  Biopsies were taken which did not show any evidence of Barrett's however, only reflux changes.  He did have an EGD in 2012 for which biopsies were obtained and consistent with short segment Barrett's esophagus.  He has maintained omeprazole 20 mg in the morning over time.  He states he does not have much of any heartburn or regurgitation on this regimen, but he does have ongoing issues with clearing of his throat that he questions is related to reflux.  He denies any dysphagia or globus.  He was recently seen by ENT and had a laryngoscopy and states he told he may have had some inflammation on that exam but no concerning pathology.  He added Pepcid 20 mg at bedtime to the regimen and does not think it made any difference in his symptoms.  He denies any nausea or vomiting.  No weight loss.  He thinks he perhaps has gained a few pounds.  He denies any blood in his stools.  No changes in his bowel habits, regular bowels.  He denies any family history of colon cancer.  He has never had a colonoscopy.  Since of last seen him he was diagnosed with rheumatoid arthritis and placed on Plaquenil for multiple joint pains and states it is working really well for him.  He was also diagnosed with hypothyroidism and on Synthroid and states he is feeling better on that regimen as well.  He has a history of palpitations and takes metoprolol 25 mg twice daily.  He states this works well to control it.  He has seen cardiology for a history of palpitations, prior CT scan chest showing coronary artery calcifications.  He has no chest pains or shortness of breath or  exertional symptoms and no further work-up was recommended, he is on a statin..  Most recent evaluation: EGD 07/25/2015 - The esophagus was normal without mucosal abnormalities. DH and GEJ noted 41cm from the incisors, with a roughly 1cm short segment of suspected Barrett's (Prague classification C0.5M1). No nodularity was appreciated within the segment when viewed on NBI. Multiple biopsies were obtained. A few small gastric polyps were noted in the gastric body and antrum, the largest 3 (roughly 6-58mm in size) were removed via hot snare with minimal blood loss. Suspect benign fundic gland polyps. The remainder of the stomach was normal. Laxity of [facility] was noted on retroflexion.The duodenal bulb and 2nd portion of the duodenum were normal. Retroflexed views revealed no abnormalities. The scope was then withdrawn from the patient and the procedure completed.  Diagnosis 1. Surgical [P], gastric antrum and gastric body - FUNDIC GLAND POLYPS. - NEGATIVE FOR HELICOBACTER PYLORI. - NO INTESTINAL METAPLASIA, DYSPLASIA OR MALIGNANCY. 2. Surgical [P], GE junction - GASTROESOPHAGEAL MUCOSA WITH CHRONIC INFLAMMATION. - NO INTESTINAL METAPLASIA, DYSPLASIA, OR MALIGNANCY.   Past Medical History:  Diagnosis Date   Adrenal adenoma, left    Incidentaloma noted on noncontrast chest CT done to f/u lung nodule seen on chest radiograph (10/2016).   Allergy    Anxiety 08/12/2012   Atypical chest pain Fall 2013   Stress echo NORMAL 08/30/12   Barrett esophagus 2012  GERD (gastroesophageal reflux disease)    Hyperlipidemia    HDL high, LDL high: simvastatin resulted in prolonged/severe muscle pain, led to long w/u and pt wants to avoid further statin unless lipids severely worsen   Neck pain 08/12/2012   Nephrolithiasis 10/2016   Bilat, nonobstruct, noted on CT chest done for f/u of pulm nodule seen on chest radiograph   Palpitations Fall/winter 2017   Improved with lopressor   Rheumatoid arthritis  ([place])    Subclinical hypothyroidism    Patient give hx of hypfunctioning goiter in the distant past, says he was given pills to "kill" the goiter.  Says bx of goiter was benign.     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 Doreatha Lew)   CARDIOVASCULAR STRESS TEST  08/2012   Stress echo normal.  Pt also reports normal ETT 09/2016.   ESOPHAGOGASTRODUODENOSCOPY  09/2011   Dr. Donnajean Lopes MONITOR  10/2016   No pathologic arrhythmias.  Occ PVCs that occ corresponded to his palpitations.     Family History  Problem Relation Age of Onset   Heart disease Father        Died of MI age 68, possible PE   Heart disease Paternal Uncle        MI's in his 54s   Heart disease Paternal Uncle    Colon cancer Neg Hx    Social History   Tobacco Use   Smoking status: Former    Types: Cigarettes    Quit date: 08/18/1979    Years since quitting: 41.9   Smokeless tobacco: Former    Types: Chew   Tobacco comments:    has not used since age 44  Substance Use Topics   Alcohol use: No    Alcohol/week: 0.0 standard drinks   Drug use: No   Current Outpatient Medications  Medication Sig Dispense Refill   aspirin EC 81 MG tablet Take 1 tablet (81 mg total) by mouth daily. 90 tablet 3   Calcium Citrate-Vitamin D 200-250 MG-UNIT TABS Take 1 tablet by mouth daily.     Cholecalciferol (VITAMIN D-3 PO) Take 2,000 Int'l Units/1.34m2 by mouth daily.     famotidine (PEPCID) 20 MG tablet Take 20 mg by mouth daily.     hydroxychloroquine (PLAQUENIL) 200 MG tablet Take by mouth. patient take 1 tablet bid x 5 days a week     levothyroxine (SYNTHROID) 50 MCG tablet Take 1 tablet (50 mcg total) by mouth daily before breakfast. 90 tablet 2   metoprolol tartrate (LOPRESSOR) 25 MG tablet TAKE 1 TABLET BY MOUTH TWICE A DAY (Patient taking differently: Take 25 mg by mouth daily.) 180 tablet 0   Multiple Vitamins-Minerals  (ZINC PO) Take 1 capsule by mouth daily.     Omega-3 Fatty Acids (FISH OIL) 1000 MG CAPS Take 1 capsule by mouth daily.     omeprazole (PRILOSEC) 20 MG capsule TAKE 1 CAPSULE BY MOUTH TWICE A DAY 180 capsule 1   rosuvastatin (CRESTOR) 10 MG tablet Take 1 tablet (10 mg total) by mouth daily. 90 tablet 3   Sodium Sulfate-Mag Sulfate-KCl (SUTAB) 5161019987 MG TABS Take 1 kit by mouth once for 1 dose. 24 tablet 0   TURMERIC PO Take 2,000 mg by mouth daily.     Ubiquinol 100 MG CAPS Take 1 capsule by mouth daily.     No current facility-administered medications for this  visit.   Allergies  Allergen Reactions   Simvastatin Other (See Comments)    myalgias   Atorvastatin Other (See Comments)    Joint pain and low back pain     Review of Systems: All systems reviewed and negative except where noted in HPI.   Lab Results  Component Value Date   WBC 5.0 05/13/2020   HGB 13.9 05/13/2020   HCT 40.0 05/13/2020   MCV 83.7 05/13/2020   PLT 174.0 05/13/2020    Lab Results  Component Value Date   CREATININE 1.13 06/18/2021   BUN 16 06/18/2021   NA 139 06/18/2021   K 4.4 06/18/2021   CL 103 06/18/2021   CO2 29 06/18/2021    Lab Results  Component Value Date   ALT 20 06/18/2021   AST 15 06/18/2021   ALKPHOS 47 06/18/2021   BILITOT 1.0 06/18/2021     Physical Exam: BP 134/68   Pulse 73   Ht $R'6\' 1"'ks$  (1.854 m)   Wt 164 lb (74.4 kg)   SpO2 97%   BMI 21.64 kg/m  Constitutional: Pleasant,well-developed, male in no acute distress. HEENT: Normocephalic and atraumatic. Conjunctivae are normal. No scleral icterus. Neck supple.  Cardiovascular: Normal rate, regular rhythm.  Pulmonary/chest: Effort normal and breath sounds normal.  Abdominal: Soft, nondistended, nontender. There are no masses palpable.  Extremities: no edema Lymphadenopathy: No cervical adenopathy noted. Neurological: Alert and oriented to person place and time. Skin: Skin is warm and dry. No rashes  noted. Psychiatric: Normal mood and affect. Behavior is normal.   ASSESSMENT AND PLAN: 55 year old male here to reestablish care for the following:  GERD, history of BE Frequent throat clearing Colon cancer screening  As above, longstanding reflux with report of short segment Barrett's in 2012.  Last EGD in 2016 suggested a possible short segment of Barrett's but biopsies were negative.  Generally pyrosis and regurgitation controlled however having frequent throat clearing of unclear etiology.  Unsure if that is related to reflux or not.  Given the symptoms and his last endoscopy was 6 years ago, I offered him another endoscopy to reassess and clarify if he truly has Barrett's or not, and to evaluate these other symptoms.  I discussed risks and benefits of the exam and anesthesia and he wants to proceed.  We will keep his reflux regimen the same until we do this exam.  He also is in need of routine colon cancer screening.  I discussed options with him, recommend optical colonoscopy and he wants to proceed with this after discussion of risks and benefits.  We will do this at the same time as his endoscopy.  Further recommendations pending the results.  He agrees with the plan.  Jolly Mango, MD [facility]  CC: Mosie Lukes, MD

## 2021-08-06 ENCOUNTER — Encounter: Payer: Self-pay | Admitting: Gastroenterology

## 2021-08-13 ENCOUNTER — Other Ambulatory Visit: Payer: Self-pay | Admitting: Cardiology

## 2021-08-14 ENCOUNTER — Ambulatory Visit (AMBULATORY_SURGERY_CENTER): Payer: BC Managed Care – PPO | Admitting: Gastroenterology

## 2021-08-14 ENCOUNTER — Other Ambulatory Visit: Payer: Self-pay

## 2021-08-14 ENCOUNTER — Encounter: Payer: Self-pay | Admitting: Gastroenterology

## 2021-08-14 VITALS — BP 122/84 | HR 79 | Temp 98.6°F | Resp 15 | Ht 73.0 in | Wt 164.0 lb

## 2021-08-14 DIAGNOSIS — D122 Benign neoplasm of ascending colon: Secondary | ICD-10-CM

## 2021-08-14 DIAGNOSIS — R0989 Other specified symptoms and signs involving the circulatory and respiratory systems: Secondary | ICD-10-CM

## 2021-08-14 DIAGNOSIS — K317 Polyp of stomach and duodenum: Secondary | ICD-10-CM

## 2021-08-14 DIAGNOSIS — K219 Gastro-esophageal reflux disease without esophagitis: Secondary | ICD-10-CM

## 2021-08-14 DIAGNOSIS — D120 Benign neoplasm of cecum: Secondary | ICD-10-CM | POA: Diagnosis not present

## 2021-08-14 DIAGNOSIS — Z1211 Encounter for screening for malignant neoplasm of colon: Secondary | ICD-10-CM

## 2021-08-14 DIAGNOSIS — K2100 Gastro-esophageal reflux disease with esophagitis, without bleeding: Secondary | ICD-10-CM | POA: Diagnosis not present

## 2021-08-14 DIAGNOSIS — K449 Diaphragmatic hernia without obstruction or gangrene: Secondary | ICD-10-CM

## 2021-08-14 MED ORDER — SODIUM CHLORIDE 0.9 % IV SOLN: 500.0000 mL | Freq: Once | INTRAVENOUS | Status: DC

## 2021-08-14 NOTE — Op Note (Addendum)
[place] Patient Name: Tyler Schmidt Procedure Date: 08/14/2021 2:19 PM MRN: [id number] Endoscopist: Remo Lipps P. Havery Moros , MD Age: 55 Referring MD:  Date of Birth: 1965-11-15 Gender: Male Account #: [id number] Procedure:                Upper GI endoscopy Indications:              Follow-up of gastro-esophageal reflux disease,                            Globus sensation / frequent throat clearing - on                            omeprazole 20mg  daily and pepcid at night, remote                            EGD with short segment Barrett's Medicines:                Monitored Anesthesia Care Procedure:                Pre-Anesthesia Assessment:                           - Prior to the procedure, a History and Physical                            was performed, and patient medications and                            allergies were reviewed. The patient's tolerance of                            previous anesthesia was also reviewed. The risks                            and benefits of the procedure and the sedation                            options and risks were discussed with the patient.                            All questions were answered, and informed consent                            was obtained. Prior Anticoagulants: The patient has                            taken no previous anticoagulant or antiplatelet                            agents. ASA Grade Assessment: II - A patient with                            mild systemic disease. After reviewing the risks  and benefits, the patient was deemed in                            satisfactory condition to undergo the procedure.                           After obtaining informed consent, the endoscope was                            passed under direct vision. Throughout the                            procedure, the patient's blood pressure, pulse, and                            oxygen saturations  were monitored continuously. The                            GIF [id number] #[id number] was introduced through the                            mouth, and advanced to the second part of duodenum.                            The upper GI endoscopy was accomplished without                            difficulty. The patient tolerated the procedure                            well. Scope In: Scope Out: Findings:                 Esophagogastric landmarks were identified: the                            Z-line was found at 40 cm, the gastroesophageal                            junction was found at 41 cm and the upper extent of                            the gastric folds was found at 43 cm from the                            incisors.                           A 2 cm hiatal hernia was present.                           The Z-line was slightly irregular and was found 40                            cm from  the incisors, short tongue of salmon                            colored mucosa perhaps upwards of 1cm in length.                            Biopsies were taken with a cold forceps for                            histology.                           The exam of the esophagus was otherwise normal. No                            erosive changes appreciated.                           A few small sessile polyps were found in the                            gastric body. Previously biopsied on last endoscopy                            and found to be benign fundic gland polyps, no                            further biopsies taken today.                           The exam of the stomach was otherwise normal.                           The duodenal bulb and second portion of the                            duodenum were normal. Complications:            No immediate complications. Estimated blood loss:                            Minimal. Estimated Blood Loss:     Estimated blood loss was minimal. Impression:                - Esophagogastric landmarks identified.                           - 2 cm hiatal hernia.                           - Z-line irregular, 40 cm from the incisors.                            Biopsied.                           -  A few benign fundic gland gastric polyps.                           - Normal stomach otherwise                           - Normal duodenal bulb and second portion of the                            duodenum.                           Overall, no erosive changes noted, although nonacid                            reflux remains possible as cause of throat clearing                            / globus Recommendation:           - Patient has a contact number available for                            emergencies. The signs and symptoms of potential                            delayed complications were discussed with the                            patient. Return to normal activities tomorrow.                            Written discharge instructions were provided to the                            patient.                           - Resume previous diet.                           - Continue present medications.                           - Next step is trial of increasing omeprazole to                            twice daily if not yet done; however, patient takes                            Plaquenil. Recommend contacting Rheumatologist to                            see if the patient would be able to increase  omeprazole to twice daily                           - Await pathology results                           - Consideration for 24 hour pH impedance testing on                            PPI if symptoms persist Oniel Meleski P. Sarh Kirschenbaum, MD 08/14/2021 3:08:27 PM This report has been signed electronically.

## 2021-08-14 NOTE — Progress Notes (Signed)
History and Physical Interval Note: Patient seen on 07/30/21 - no interval changes since his last exam. EGD to follow up GERD, suspected short segment BE, and symptoms of throat clearing thought potentially due to reflux. On omeprazole 20mg  / day and pepcid 20mg  / day. Also here for first colonoscopy for screening purposes. He denies any changes since I have seen him and feels well today otherwise.  08/14/2021 2:33 PM  Tyler Schmidt  has presented today for endoscopic procedure(s), with the diagnosis of  Encounter Diagnoses  Name Primary?   Colon cancer screening Yes   Throat clearing    Gastroesophageal reflux disease, unspecified whether esophagitis present   .  The various methods of evaluation and treatment have been discussed with the patient and/or family. After consideration of risks, benefits and other options for treatment, the patient has consented to  the endoscopic procedure(s).   The patient's history has been reviewed, patient examined, no change in status, stable for surgery.  I have reviewed the patient's chart and labs.  Questions were answered to the patient's satisfaction.    Jolly Mango, MD [facility]

## 2021-08-14 NOTE — Progress Notes (Signed)
Pt's states no medical or surgical changes since previsit or office visit. 

## 2021-08-14 NOTE — Progress Notes (Signed)
Called to room to assist during endoscopic procedure.  Patient ID and intended procedure confirmed with present staff. Received instructions for my participation in the procedure from the performing physician.  

## 2021-08-14 NOTE — Progress Notes (Signed)
C.W. vital signs. 

## 2021-08-14 NOTE — Op Note (Signed)
[place] Patient Name: Tyler Schmidt Procedure Date: 08/14/2021 2:12 PM MRN: [id number] Endoscopist: Tyler Lipps Tyler Schmidt , MD Age: 55 Referring MD:  Date of Birth: [birth year] Gender: Male Account #: [id number] Procedure:                Colonoscopy Indications:              Screening for colorectal malignant neoplasm, This                            is the patient's first colonoscopy Medicines:                Monitored Anesthesia Care Procedure:                Pre-Anesthesia Assessment:                           - Prior to the procedure, a History and Physical                            was performed, and patient medications and                            allergies were reviewed. The patient's tolerance of                            previous anesthesia was also reviewed. The risks                            and benefits of the procedure and the sedation                            options and risks were discussed with the patient.                            All questions were answered, and informed consent                            was obtained. Prior Anticoagulants: The patient has                            taken no previous anticoagulant or antiplatelet                            agents. ASA Grade Assessment: II - A patient with                            mild systemic disease. After reviewing the risks                            and benefits, the patient was deemed in                            satisfactory condition to undergo the procedure.  After obtaining informed consent, the colonoscope                            was passed under direct vision. Throughout the                            procedure, the patient's blood pressure, pulse, and                            oxygen saturations were monitored continuously. The                            Colonoscope was introduced through the anus and                            advanced to the  the cecum, identified by                            appendiceal orifice and ileocecal valve. The                            colonoscopy was performed without difficulty. The                            patient tolerated the procedure well. The quality                            of the bowel preparation was good. The ileocecal                            valve, appendiceal orifice, and rectum were                            photographed. Scope In: 2:44:56 PM Scope Out: 2:59:00 PM Scope Withdrawal Time: 0 hours 11 minutes 51 seconds  Total Procedure Duration: 0 hours 14 minutes 4 seconds  Findings:                 The perianal and digital rectal examinations were                            normal.                           A 3 mm polyp was found in the cecum. The polyp was                            sessile. The polyp was removed with a cold snare.                            Resection and retrieval were complete.                           A 3 mm polyp was found in the ascending colon. The  polyp was sessile. The polyp was removed with a                            cold snare. Resection and retrieval were complete.                           Internal hemorrhoids were found during retroflexion.                           The exam was otherwise without abnormality. Complications:            No immediate complications. Estimated blood loss:                            Minimal. Estimated Blood Loss:     Estimated blood loss was minimal. Impression:               - One 3 mm polyp in the cecum, removed with a cold                            snare. Resected and retrieved.                           - One 3 mm polyp in the ascending colon, removed                            with a cold snare. Resected and retrieved.                           - Internal hemorrhoids.                           - The examination was otherwise normal. Recommendation:           - Patient has a contact  number available for                            emergencies. The signs and symptoms of potential                            delayed complications were discussed with the                            patient. Return to normal activities tomorrow.                            Written discharge instructions were provided to the                            patient.                           - Resume previous diet.                           - Continue present medications.                           -  Await pathology results. Tyler Lipps P. Tyler Klosowski, MD 08/14/2021 3:02:24 PM This report has been signed electronically.

## 2021-08-14 NOTE — Progress Notes (Signed)
Pt Drowsy. VSS. To PACU, report to RN. No anesthetic complications noted.  

## 2021-08-14 NOTE — Patient Instructions (Signed)
Handouts given for polyps and Hiatal Hernia  YOU HAD AN ENDOSCOPIC PROCEDURE TODAY AT [facility]:   Refer to the procedure report that was given to you for any specific questions about what was found during the examination.  If the procedure report does not answer your questions, please call your gastroenterologist to clarify.  If you requested that your care partner not be given the details of your procedure findings, then the procedure report has been included in a sealed envelope for you to review at your convenience later.  YOU SHOULD EXPECT: Some feelings of bloating in the abdomen. Passage of more gas than usual.  Walking can help get rid of the air that was put into your GI tract during the procedure and reduce the bloating. If you had a lower endoscopy (such as a colonoscopy or flexible sigmoidoscopy) you may notice spotting of blood in your stool or on the toilet paper. If you underwent a bowel prep for your procedure, you may not have a normal bowel movement for a few days.  Please Note:  You might notice some irritation and congestion in your nose or some drainage.  This is from the oxygen used during your procedure.  There is no need for concern and it should clear up in a day or so.  SYMPTOMS TO REPORT IMMEDIATELY:  Following lower endoscopy (colonoscopy):  Excessive amounts of blood in the stool  Significant tenderness or worsening of abdominal pains  Swelling of the abdomen that is new, acute  Fever of 100F or higher  Following upper endoscopy (EGD)  Vomiting of blood or coffee ground material  New chest pain or pain under the shoulder blades  Painful or persistently difficult swallowing  New shortness of breath  Black, tarry-looking stools  For urgent or emergent issues, a gastroenterologist can be reached at any hour by calling [phone]. Do not use MyChart messaging for urgent concerns.    DIET:  We do recommend a small meal at first, but then you  may proceed to your regular diet.  Drink plenty of fluids but you should avoid alcoholic beverages for 24 hours.  ACTIVITY:  You should plan to take it easy for the rest of today and you should NOT DRIVE or use heavy machinery until tomorrow (because of the sedation medicines used during the test).    FOLLOW UP: Our staff will call the number listed on your records 48-72 hours following your procedure to check on you and address any questions or concerns that you may have regarding the information given to you following your procedure. If we do not reach you, we will leave a message.  We will attempt to reach you two times.  During this call, we will ask if you have developed any symptoms of COVID 19. If you develop any symptoms (ie: fever, flu-like symptoms, shortness of breath, cough etc.) before then, please call [phone].  If you test positive for Covid 19 in the 2 weeks post procedure, please call and report this information to [place].    If any biopsies were taken you will be contacted by phone or by letter within the next 1-3 weeks.  Please call us at [phone] if you have not heard about the biopsies in 3 weeks.    SIGNATURES/CONFIDENTIALITY: You and/or your care partner have signed paperwork which will be entered into your electronic medical record.  These signatures attest to the fact that that the information above on your After Visit  Summary has been reviewed and is understood.  Full responsibility of the confidentiality of this discharge information lies with you and/or your care-partner.  

## 2021-08-18 ENCOUNTER — Telehealth: Payer: Self-pay

## 2021-08-18 NOTE — Telephone Encounter (Signed)
  Follow up Call-  Call back number 08/14/2021  Post procedure Call Back phone  # [phone]  Permission to leave phone message Yes  Some recent data might be hidden     Patient questions:  Do you have a fever, pain , or abdominal swelling? No. Pain Score  0 *  Have you tolerated food without any problems? Yes.    Have you been able to return to your normal activities? Yes.    Do you have any questions about your discharge instructions: Diet   No. Medications  No. Follow up visit  No.  Do you have questions or concerns about your Care? Yes.     Pt reported that he was having lower abdominal "soreness", about 3 inches from his belly button.  I asked if it was a pain and pt replied, "not a pain but sore".  I advised him it was not related to polyp removal.  That sometimes we give abdominal pressure to aid scope advancement, but I was not in the procedure room and not sure if abdominal pain was given to his abdomin or not.  Pt was told to call back if sx does not resolve on its owe.  Pt said he would.  Maw   Actions: * If pain score is 4 or above: No action needed, pain <4.    Have you developed a fever since your procedure? no  2.   Have you had an respiratory symptoms (SOB or cough) since your procedure? no  3.   Have you tested positive for COVID 19 since your procedure no  4.   Have you had any family members/close contacts diagnosed with the COVID 19 since your procedure?  no   If yes to any of these questions please route to Joylene John, RN and Joella Prince, RN

## 2021-10-01 ENCOUNTER — Encounter: Payer: Self-pay | Admitting: *Deleted

## 2021-10-23 DIAGNOSIS — S61200A Unspecified open wound of right index finger without damage to nail, initial encounter: Secondary | ICD-10-CM | POA: Diagnosis not present

## 2021-11-13 ENCOUNTER — Encounter: Payer: BC Managed Care – PPO | Admitting: Family Medicine

## 2021-11-24 ENCOUNTER — Telehealth: Payer: Self-pay | Admitting: Cardiology

## 2021-11-24 NOTE — Telephone Encounter (Signed)
°*  STAT* If patient is at the pharmacy, call can be transferred to refill team.   1. Which medications need to be refilled? (please list name of each medication and dose if known)  Metoprolol Extended Release  2. Which pharmacy/location (including street and city if local pharmacy) is medication to be sent to? [address], [place], [place] [address]  3. Do they need a 30 day or 90 day supply?   90 day supply  Patient states he was switched to this version and taken off Metoprolol Tartrate 25 MG during 07/02/21 appointment with Dr. Stanford Breed.

## 2021-11-25 MED ORDER — METOPROLOL SUCCINATE ER 50 MG PO TB24: 50.0000 mg | ORAL_TABLET | Freq: Every day | ORAL | 3 refills | Status: DC

## 2021-11-25 NOTE — Telephone Encounter (Signed)
Spoke with pt, he reports that dr Stanford Breed was going to change him from metoprolol tartrate to succ because he is only taking the tartrate 25 mg once daily. Will forward for dr Stanford Breed review for succ er dosing.

## 2021-11-25 NOTE — Telephone Encounter (Signed)
New script sent to the pharmacy

## 2021-12-18 NOTE — Progress Notes (Signed)
Tyler Schmidt Tyler Schmidt Phone: [phone]   Assessment and Plan:     1. Left wrist pain 4. Ganglion cyst of wrist, left 3. Rheumatoid arthritis involving multiple sites, unspecified whether rheumatoid factor present ([place]) - Chronic with exacerbation, initial sports medicine visit - Suspect that rheumatoid arthritis is contributing to bilateral wrist pain based on HPI, physical exam, x-rays - X-ray obtained in clinic.  My interpretation: No acute fracture or dislocation.  Increased sclerosis over articular surface of distal radius seen bilaterally - I suspect that additionally patient's left wrist pain, radicular radial symptoms are likely due to ganglion cyst present on wrist.  Patient elected for CSI to ganglion cyst.  Tolerated well per note below - Start meloxicam 15 mg daily x2 weeks.  If still having pain after 2 weeks, complete 3rd-week of meloxicam. May use remaining meloxicam as needed once daily for pain control.  Do not to use additional NSAIDs while taking meloxicam.  May use Tylenol [phone] mg to 3 times a day for breakthrough pain. - Start HEP for wrists - DG Wrist Complete Left; Future  Procedure:  Ganglion Cyst injection Side: Left Diagnosis: Ganglion cyst    After explaining the procedure, viable alternatives, risks, and answering any questions, consent was given verbally. The site was cleaned with alcohol prep. A  0.5 ml of 1% lidocaine without epinephrine and 20 mg of triamcinolone 40 mg/ml was injected.  Needle was removed and dressing placed and post injection instructions were given including possible pain after anesthetic wears off.  Pt was advised to call or return to clinic if these symptoms worsen or fail to improve as anticipated.   2. Right wrist pain 3. Rheumatoid arthritis involving multiple sites, unspecified whether rheumatoid factor present ([place])  - Chronic with exacerbation,  initial sports medicine visit - Suspect that rheumatoid arthritis is contributing to bilateral wrist pain based on HPI, physical exam, x-rays - X-ray obtained in clinic.  My interpretation: No acute fracture or dislocation.  Increased sclerosis over articular surface of distal radius seen bilaterally - DG Wrist Complete Right; Future - Start meloxicam 15 mg daily x2 weeks.  If still having pain after 2 weeks, complete 3rd-week of meloxicam. May use remaining meloxicam as needed once daily for pain control.  Do not to use additional NSAIDs while taking meloxicam.  May use Tylenol [phone] mg to 3 times a day for breakthrough pain. - Start HEP for wrist    Pertinent previous records reviewed include none   Follow Up: 3 to 4 weeks for reevaluation.  Could consider repeat aspiration/CSI if ganglion cyst is not resolved.  Could consider ultrasound of ganglion and wrists   Subjective:   I, Moenique Parris, am serving as a Education administrator for Doctor Glennon Mac  Chief Complaint: left wrist pain   HPI:  12/19/2021 Patient is a 56 year old male complaining of left wrist pain. Patient states radial side protuberance , painful with all movements , been going on for about 3-4 months bur the protuberance went away, no MOI , no numbness tingling , does get radiating pain going up the arm . The whole wrist structure is in pain , no ib or tylenol, no heat or ice . right wrist hurts as well does have rheumatoid arthritis but hasn't had problems . ever since wrist pain hasn't been able to push ups , hs had CSI Ion one  of his wrist cant remember which one  but it did nothing for the pain   Relevant Historical Information: Rheumatoid arthritis on hydroxychloroquine  Additional pertinent review of systems negative.   Current Outpatient Medications:    aspirin EC 81 MG tablet, Take 1 tablet (81 mg total) by mouth daily., Disp: 90 tablet, Rfl: 3   Calcium Citrate-Vitamin D 200-250 MG-UNIT TABS, Take 1 tablet by  mouth daily., Disp: , Rfl:    Cholecalciferol (VITAMIN D-3 PO), Take 2,000 Int'l Units/1.15m2 by mouth daily., Disp: , Rfl:    famotidine (PEPCID) 20 MG tablet, Take 20 mg by mouth daily., Disp: , Rfl:    hydroxychloroquine (PLAQUENIL) 200 MG tablet, Take by mouth. patient take 1 tablet bid x 5 days a week, Disp: , Rfl:    levothyroxine (SYNTHROID) 50 MCG tablet, Take 1 tablet (50 mcg total) by mouth daily before breakfast., Disp: 90 tablet, Rfl: 2   meloxicam (MOBIC) 15 MG tablet, Take 1 tablet (15 mg total) by mouth daily., Disp: 30 tablet, Rfl: 0   metoprolol succinate (TOPROL-XL) 50 MG 24 hr tablet, Take 1 tablet (50 mg total) by mouth daily. Take with or immediately following a meal., Disp: 90 tablet, Rfl: 3   Multiple Vitamins-Minerals (ZINC PO), Take 1 capsule by mouth daily., Disp: , Rfl:    Omega-3 Fatty Acids (FISH OIL) 1000 MG CAPS, Take 1 capsule by mouth daily., Disp: , Rfl:    omeprazole (PRILOSEC) 20 MG capsule, TAKE 1 CAPSULE BY MOUTH TWICE A DAY, Disp: 180 capsule, Rfl: 1   rosuvastatin (CRESTOR) 10 MG tablet, Take 1 tablet (10 mg total) by mouth daily., Disp: 90 tablet, Rfl: 3   TURMERIC PO, Take 2,000 mg by mouth daily., Disp: , Rfl:    Ubiquinol 100 MG CAPS, Take 1 capsule by mouth daily., Disp: , Rfl:    Objective:     Vitals:   12/19/21 1055  Pulse: 80  SpO2: 93%  Weight: 164 lb (74.4 kg)  Height: 6\' 1"  (1.854 m)      Body mass index is 21.64 kg/m.    Physical Exam:    General: Appears well, nad, nontoxic and pleasant Neuro:sensation intact, strength is 5/5 with df/pf/inv/ev, muscle tone wnl Skin:no susupicious lesions or rashes  Bilateral wrist: 1 cm round, mobile, soft nodule felt over anterior distal radius.  Nodule was NTTP without lesion or erythema ROM  Ext 90, flexion70, radial/ulnar deviation 30 nttp over the snauff box, dorsal carpals, volar carpals, radial styloid, ulnar styloid, 1st mcp, tfcc Negative Tinel's Negative finklestein Neg tfcc bounce  test Mild pain with resisted ext, flex or deviation    Electronically signed by:  Tyler Mccreedy D.[facility] 12:11 PM 12/19/21

## 2021-12-19 ENCOUNTER — Ambulatory Visit (INDEPENDENT_AMBULATORY_CARE_PROVIDER_SITE_OTHER): Payer: BC Managed Care – PPO

## 2021-12-19 ENCOUNTER — Other Ambulatory Visit: Payer: Self-pay

## 2021-12-19 ENCOUNTER — Ambulatory Visit (INDEPENDENT_AMBULATORY_CARE_PROVIDER_SITE_OTHER): Payer: BC Managed Care – PPO | Admitting: Sports Medicine

## 2021-12-19 VITALS — HR 80 | Ht 73.0 in | Wt 164.0 lb

## 2021-12-19 DIAGNOSIS — M25531 Pain in right wrist: Secondary | ICD-10-CM | POA: Diagnosis not present

## 2021-12-19 DIAGNOSIS — M25532 Pain in left wrist: Secondary | ICD-10-CM | POA: Diagnosis not present

## 2021-12-19 DIAGNOSIS — M069 Rheumatoid arthritis, unspecified: Secondary | ICD-10-CM

## 2021-12-19 DIAGNOSIS — M67432 Ganglion, left wrist: Secondary | ICD-10-CM | POA: Diagnosis not present

## 2021-12-19 MED ORDER — MELOXICAM 15 MG PO TABS: 15.0000 mg | ORAL_TABLET | Freq: Every day | ORAL | 0 refills | Status: DC

## 2021-12-19 NOTE — Patient Instructions (Addendum)
Good to see you  - Start meloxicam 15 mg daily x2 weeks.  If still having pain after 2 weeks, complete 3rd-week of meloxicam. May use remaining meloxicam as needed once daily for pain control.  Do not to use additional NSAIDs while taking meloxicam.  May use Tylenol [phone] mg to 3 times a day for breakthrough pain. Wrist HEP  3-4 week follow up

## 2021-12-22 ENCOUNTER — Ambulatory Visit: Payer: BC Managed Care – PPO | Admitting: Sports Medicine

## 2022-01-12 ENCOUNTER — Other Ambulatory Visit: Payer: Self-pay | Admitting: Family Medicine

## 2022-01-13 ENCOUNTER — Ambulatory Visit: Payer: BC Managed Care – PPO | Admitting: Sports Medicine

## 2022-01-26 ENCOUNTER — Encounter: Payer: Self-pay | Admitting: Cardiology

## 2022-01-26 ENCOUNTER — Telehealth: Payer: Self-pay | Admitting: Cardiology

## 2022-01-26 NOTE — Telephone Encounter (Signed)
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 Stanford Breed. He says they are also needing some records. He says he would like an email he can send the paperwork to.  ? ? ?

## 2022-01-26 NOTE — Telephone Encounter (Signed)
Spoke with pt regarding [place] paperwork, pt has about 30 more day to get this completed by Dr. 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 Pt is going to send this to [place] via mychart. Will forward to primary nurse.  ?

## 2022-01-27 NOTE — Telephone Encounter (Signed)
Spoke with patient, because he has not been seen in the office for sometime follow up appointment needed. ?

## 2022-01-29 ENCOUNTER — Encounter: Payer: Self-pay | Admitting: *Deleted

## 2022-02-02 NOTE — Progress Notes (Signed)
? ? ? ? ?HPI:FU palpitations. Stress echocardiogram 2009 normal. Stress echocardiogram October 2013 normal. Holter monitor December 2017 showed sinus rhythm with PVCs. Stress echocardiogram at [facility] November 2017 normal. Chest CT December 2017 showed coronary artery calcification.  Since last seen patient denies dyspnea on exertion, orthopnea, PND, pedal edema, exertional chest pain or syncope.  Occasional rare palpitations that are treated with metoprolol. ? ?Current Outpatient Medications  ?Medication Sig Dispense Refill  ? aspirin EC 81 MG tablet Take 1 tablet (81 mg total) by mouth daily. 90 tablet 3  ? Calcium Citrate-Vitamin D 200-250 MG-UNIT TABS Take 1 tablet by mouth daily.    ? Cholecalciferol (VITAMIN D-3 PO) Take 2,000 Int'l Units/1.40m by mouth daily.    ? famotidine (PEPCID) 20 MG tablet Take 20 mg by mouth daily.    ? hydroxychloroquine (PLAQUENIL) 200 MG tablet Take by mouth. patient take 1 tablet bid x 5 days a week    ? levothyroxine (SYNTHROID) 50 MCG tablet Take 1 tablet (50 mcg total) by mouth daily before breakfast. 90 tablet 2  ? metoprolol succinate (TOPROL-XL) 50 MG 24 hr tablet Take 1 tablet (50 mg total) by mouth daily. Take with or immediately following a meal. 90 tablet 3  ? Multiple Vitamins-Minerals (ZINC PO) Take 1 capsule by mouth daily.    ? Omega-3 Fatty Acids (FISH OIL) 1000 MG CAPS Take 1 capsule by mouth daily.    ? omeprazole (PRILOSEC) 20 MG capsule Take 1 capsule (20 mg total) by mouth 2 (two) times daily before a meal. (Patient taking differently: Take 20 mg by mouth daily.) 60 capsule 0  ? rosuvastatin (CRESTOR) 10 MG tablet Take 1 tablet (10 mg total) by mouth daily. 90 tablet 3  ? TURMERIC PO Take 2,000 mg by mouth daily.    ? Ubiquinol 100 MG CAPS Take 1 capsule by mouth daily.    ? ?No current facility-administered medications for this visit.  ? ? ? ?Past Medical History:  ?Diagnosis Date  ? Adrenal adenoma, left   ? Incidentaloma noted on noncontrast chest  CT done to f/u lung nodule seen on chest radiograph (10/2016).  ? Allergy   ? Anxiety 08/12/2012  ? Atypical chest pain Fall 2013  ? Stress echo NORMAL 08/30/12  ? Barrett esophagus 2012  ? GERD (gastroesophageal reflux disease)   ? Hyperlipidemia   ? HDL high, LDL high: simvastatin resulted in prolonged/severe muscle pain, led to long w/u and pt wants to avoid further statin unless lipids severely worsen  ? Neck pain 08/12/2012  ? Nephrolithiasis 10/2016  ? Bilat, nonobstruct, noted on CT chest done for f/u of pulm nodule seen on chest radiograph  ? Palpitations Fall/winter 2017  ? Improved with lopressor  ? Rheumatoid arthritis ([place]   ? Subclinical hypothyroidism   ? Patient give hx of hypfunctioning goiter in the distant past, says he was given pills to "kill" the goiter.  Says bx of goiter was benign.  ? ? ?Past Surgical History:  ?Procedure Laterality Date  ? CARDIOVASCULAR STRESS TEST  age 56 ? 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 TDoreatha Lew  ? CARDIOVASCULAR STRESS TEST  08/2012  ? Stress echo normal.  Pt also reports normal ETT 09/2016.  ? ESOPHAGOGASTRODUODENOSCOPY  09/2011  ? Dr. BOlevia Perches ? HOLTER MONITOR  10/2016  ? No pathologic arrhythmias.  Occ PVCs that occ corresponded to his palpitations.    ? ? ?Social History  ? ?Socioeconomic History  ? Marital  status: Married  ?  Spouse name: Not on file  ? Number of children: 2  ? Years of education: Not on file  ? Highest education level: Not on file  ?Occupational History  ?  Employer: [employer]  ?  Comment: Pilot  ?Tobacco Use  ? Smoking status: Former  ?  Types: Cigarettes  ?  Quit date: 08/18/1979  ?  Years since quitting: 42.5  ? Smokeless tobacco: Former  ?  Types: Chew  ? Tobacco comments:  ?  has not used since age 89  ?Substance and Sexual Activity  ? Alcohol use: No  ?  Alcohol/week: 0.0 standard drinks  ? Drug use: No  ? Sexual activity: Yes  ?Other Topics Concern  ? Not on file  ?Social History Narrative  ? Married, boy/girl twins age 33yr.  ?  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  ? [place]  ? No T/A/Ds.  ? Cardiovasc exercise regularly.  Prudent diet.  ?   ?   ? ?Social Determinants of Health  ? ?Financial Resource Strain: Not on file  ?Food Insecurity: Not on file  ?Transportation Needs: Not on file  ?Physical Activity: Not on file  ?Stress: Not on file  ?Social Connections: Not on file  ?Intimate Partner Violence: Not on file  ? ? ?Family History  ?Problem Relation Age of Onset  ? Heart disease Father   ?     Died of MI age 54 possible PE  ? Heart disease Paternal Uncle   ?     MI's in his 49s ? Heart disease Paternal Uncle   ? Colon cancer Neg Hx   ? ? ?ROS: no fevers or chills, productive cough, hemoptysis, dysphasia, odynophagia, melena, hematochezia, dysuria, hematuria, rash, seizure activity, orthopnea, PND, pedal edema, claudication. Remaining systems are negative. ? ?Physical Exam: ?Well-developed well-nourished in no acute distress.  ?Skin is warm and dry.  ?HEENT is normal.  ?Neck is supple.  ?Chest is clear to auscultation with normal expansion.  ?Cardiovascular exam is regular rate and rhythm.  ?Abdominal exam nontender or distended. No masses palpated. ?Extremities show no edema. ?neuro grossly intact ? ?ECG-sinus bradycardia at a rate of 58, normal axis, no ST changes.  Personally reviewed ? ?A/P ? ?1 coronary calcification-based on coronary calcification noted on previous CT scan.  Follow-up functional study showed no ischemia.  He has excellent functional capacity and patient denies chest pain.  Plan to continue medical therapy with aspirin and statin. ? ?2 hyperlipidemia-continue statin.  Given history of coronary calcification I will increase Crestor to 40 mg daily.  Check lipids and liver in 8 weeks. ? ?3 palpitations-symptoms are reasonably well controlled.  We will continue his metoprolol for symptomatic purposes. ? 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  His electrocardiogram is normal.  There is no contraindication for flying from a cardiac standpoint.  His metoprolol is purely for symptomatic relief from palpitations and is also not a contraindication to flying.  He has excellent prognosis and no restrictions. ? ?BKirk Ruths MD ? ? ? ?

## 2022-02-16 ENCOUNTER — Encounter: Payer: Self-pay | Admitting: Cardiology

## 2022-02-16 ENCOUNTER — Ambulatory Visit (INDEPENDENT_AMBULATORY_CARE_PROVIDER_SITE_OTHER): Payer: BC Managed Care – PPO | Admitting: Cardiology

## 2022-02-16 VITALS — BP 116/60 | HR 58 | Ht 73.0 in | Wt 166.0 lb

## 2022-02-16 DIAGNOSIS — I2510 Atherosclerotic heart disease of native coronary artery without angina pectoris: Secondary | ICD-10-CM | POA: Diagnosis not present

## 2022-02-16 DIAGNOSIS — R002 Palpitations: Secondary | ICD-10-CM

## 2022-02-16 DIAGNOSIS — E785 Hyperlipidemia, unspecified: Secondary | ICD-10-CM

## 2022-02-16 MED ORDER — ROSUVASTATIN CALCIUM 40 MG PO TABS: 40.0000 mg | ORAL_TABLET | Freq: Every day | ORAL | 3 refills | Status: DC

## 2022-02-16 NOTE — Patient Instructions (Signed)
Medication Instructions:  ? ?INCREASE ROSUVASTATIN TO 40 MG ONCE DAILY ? ?*If you need a refill on your cardiac medications before your next appointment, please call your pharmacy* ? ? ?Lab Work: ? ?Your physician recommends that you return for lab work in: [place] ? ?If you have labs (blood work) drawn today and your tests are completely normal, you will receive your results only by: ?MyChart Message (if you have MyChart) OR ?A paper copy in the mail ?If you have any lab test that is abnormal or we need to change your treatment, we will call you to review the results. ? ? ?Follow-Up: ?At [facility], you and your health needs are our priority.  As part of our continuing mission to provide you with exceptional heart care, we have created designated Provider Care Teams.  These Care Teams include your primary Cardiologist (physician) and Advanced Practice Providers (APPs -  Physician Assistants and Nurse Practitioners) who all work together to provide you with the care you need, when you need it. ? ?We recommend signing up for the patient portal called "MyChart".  Sign up information is provided on this After Visit Summary.  MyChart is used to connect with patients for Virtual Visits (Telemedicine).  Patients are able to view lab/test results, encounter notes, upcoming appointments, etc.  Non-urgent messages can be sent to your provider as well.   ?To learn more about what you can do with MyChart, go to [email or web address].   ? ?Your next appointment:   ?12 month(s) ? ?The format for your next appointment:   ?In Person ? ?Provider:   ?Kirk Ruths MD  ? ? ? ?Important Information About Sugar ? ? ? ? ?  ?

## 2022-02-17 LAB — CBC
Hematocrit: 40.6 % (ref 37.5–51.0)
Hemoglobin: 14.2 g/dL (ref 13.0–17.7)
MCH: 29.7 pg (ref 26.6–33.0)
MCHC: 35 g/dL (ref 31.5–35.7)
MCV: 85 fL (ref 79–97)
Platelets: 211 10*3/uL (ref 150–450)
RBC: 4.78 x10E6/uL (ref 4.14–5.80)
RDW: 12.6 % (ref 11.6–15.4)
WBC: 6.3 10*3/uL (ref 3.4–10.8)

## 2022-02-17 LAB — COMPREHENSIVE METABOLIC PANEL
ALT: 28 IU/L (ref 0–44)
AST: 21 IU/L (ref 0–40)
Albumin/Globulin Ratio: 2.2 (ref 1.2–2.2)
Albumin: 4.9 g/dL (ref 3.8–4.9)
Alkaline Phosphatase: 57 IU/L (ref 44–121)
BUN/Creatinine Ratio: 12 (ref 9–20)
BUN: 13 mg/dL (ref 6–24)
Bilirubin Total: 0.8 mg/dL (ref 0.0–1.2)
CO2: 23 mmol/L (ref 20–29)
Calcium: 9.7 mg/dL (ref 8.7–10.2)
Chloride: 103 mmol/L (ref 96–106)
Creatinine, Ser: 1.1 mg/dL (ref 0.76–1.27)
Globulin, Total: 2.2 g/dL (ref 1.5–4.5)
Glucose: 92 mg/dL (ref 70–99)
Potassium: 4.5 mmol/L (ref 3.5–5.2)
Sodium: 141 mmol/L (ref 134–144)
Total Protein: 7.1 g/dL (ref 6.0–8.5)
eGFR: 80 mL/min/{1.73_m2} (ref >59)

## 2022-02-24 ENCOUNTER — Other Ambulatory Visit: Payer: Self-pay | Admitting: Family Medicine

## 2022-02-27 ENCOUNTER — Telehealth: Payer: Self-pay | Admitting: Cardiology

## 2022-02-27 NOTE — Telephone Encounter (Signed)
Patient called stating that Dr. Stanford Breed going to get ready for him a letter he needs for the [facility] about his condition.  His lab work also needs to be included. He called to check the status of that.  ?

## 2022-02-27 NOTE — Telephone Encounter (Signed)
Spoke with pt, he is aware dr Stanford Breed dictated everything in his office note. The patient will try to print the office note and lab work from my chart. If he has problems he will let me know. ?

## 2022-02-27 NOTE — Telephone Encounter (Signed)
Returned call to pt, verified DOB. He states that when he was her for his last appt this month, he told Dr Stanford Breed that he needed a letter for the [place] to continue his license. Dr Stanford Breed willget this letter together and write this for him he has not heard anything and was wondering the status. He would also need his labwork added to this note due to his metoprolol use. Please call pt when this letter is ready, pt expresses thanks ?

## 2022-03-02 ENCOUNTER — Encounter: Payer: Self-pay | Admitting: Cardiology

## 2022-03-12 ENCOUNTER — Ambulatory Visit: Payer: BC Managed Care – PPO | Admitting: Family Medicine

## 2022-03-20 ENCOUNTER — Ambulatory Visit (INDEPENDENT_AMBULATORY_CARE_PROVIDER_SITE_OTHER): Payer: BC Managed Care – PPO | Admitting: Family

## 2022-03-20 ENCOUNTER — Encounter: Payer: Self-pay | Admitting: Family

## 2022-03-20 VITALS — BP 108/78 | HR 66 | Resp 20 | Ht 73.0 in | Wt 162.0 lb

## 2022-03-20 DIAGNOSIS — M069 Rheumatoid arthritis, unspecified: Secondary | ICD-10-CM | POA: Diagnosis not present

## 2022-03-20 DIAGNOSIS — E785 Hyperlipidemia, unspecified: Secondary | ICD-10-CM | POA: Diagnosis not present

## 2022-03-20 DIAGNOSIS — E039 Hypothyroidism, unspecified: Secondary | ICD-10-CM | POA: Diagnosis not present

## 2022-03-20 DIAGNOSIS — I2510 Atherosclerotic heart disease of native coronary artery without angina pectoris: Secondary | ICD-10-CM

## 2022-03-20 DIAGNOSIS — K219 Gastro-esophageal reflux disease without esophagitis: Secondary | ICD-10-CM | POA: Diagnosis not present

## 2022-03-20 LAB — TSH: TSH: 3.94 u[IU]/mL (ref 0.35–5.50)

## 2022-03-20 LAB — LIPID PANEL
Cholesterol: 128 mg/dL (ref 0–200)
HDL: 46.1 mg/dL (ref >39.00)
LDL Cholesterol: 56 mg/dL (ref 0–99)
NonHDL: 82.27
Total CHOL/HDL Ratio: 3
Triglycerides: 130 mg/dL (ref 0.0–149.0)
VLDL: 26 mg/dL (ref 0.0–40.0)

## 2022-03-20 NOTE — Assessment & Plan Note (Signed)
Maintained on toprol xl per cardiology.  BP stable.

## 2022-03-20 NOTE — Assessment & Plan Note (Signed)
Clinically stable on synthroid. Obtain follow up tsh level.

## 2022-03-20 NOTE — Progress Notes (Signed)
Subjective:     Patient ID: Tyler Schmidt, male    DOB: Mar 23, 1966, 56 y.o.   MRN: [id number]  Chief Complaint  Patient presents with   Follow-up    HPI Patient is in today for follow up.  Reports that he has had some dizziness and mild HA 2-3 weeks ago. Then had congestion.  Reports that he took decongestants.   Symptoms resolved.    Hyperlipidemia- maintained on crestor.  Lab Results  Component Value Date   CHOL 182 06/18/2021   HDL 49.60 06/18/2021   LDLCALC 105 (H) 06/18/2021   LDLDIRECT 172.0 12/17/2016   TRIG 138.0 06/18/2021   CHOLHDL 4 06/18/2021   Hypothyroid-  Lab Results  Component Value Date   TSH 3.34 05/13/2020   CAD- maintained on toprol xl.  BP Readings from Last 3 Encounters:  03/20/22 108/78  02/16/22 116/60  08/14/21 122/84   RA- on plaquenil.   Barrett's esophagus/gerd- maintained on pepcid and omeprazole. Had colo and endo this past fall.    Health Maintenance Due  Topic Date Due   Zoster Vaccines- Shingrix (1 of 2) Never done   COVID-19 Vaccine (2 - Janssen risk series) 10/17/2020    Past Medical History:  Diagnosis Date   Adrenal adenoma, left    Incidentaloma noted on noncontrast chest CT done to f/u lung nodule seen on chest radiograph (10/2016).   Allergy    Anxiety 08/12/2012   Atypical chest pain Fall 2013   Stress echo NORMAL 08/30/12   Barrett esophagus 2012   GERD (gastroesophageal reflux disease)    Hyperlipidemia    HDL high, LDL high: simvastatin resulted in prolonged/severe muscle pain, led to long w/u and pt wants to avoid further statin unless lipids severely worsen   Neck pain 08/12/2012   Nephrolithiasis 10/2016   Bilat, nonobstruct, noted on CT chest done for f/u of pulm nodule seen on chest radiograph   Palpitations Fall/winter 2017   Improved with lopressor   Rheumatoid arthritis ([place])    Subclinical hypothyroidism    Patient give hx of hypfunctioning goiter in the distant past, says he was given pills  to "kill" the goiter.  Says bx of goiter was benign.    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 Doreatha Lew)   CARDIOVASCULAR STRESS TEST  08/2012   Stress echo normal.  Pt also reports normal ETT 09/2016.   ESOPHAGOGASTRODUODENOSCOPY  09/2011   Dr. Donnajean Lopes MONITOR  10/2016   No pathologic arrhythmias.  Occ PVCs that occ corresponded to his palpitations.      Family History  Problem Relation Age of Onset   Heart disease Father        Died of MI age 2, possible PE   Heart disease Paternal Uncle        MI's in his 19s   Heart disease Paternal Uncle    Colon cancer Neg Hx     Social History   Socioeconomic History   Marital status: Married    Spouse name: Not on file   Number of children: 2   Years of education: Not on file   Highest education level: Not on file  Occupational History    Employer: [employer]    Comment: Pilot  Tobacco Use   Smoking status: Former    Types: Cigarettes    Quit date: 08/18/1979    Years since  quitting: 42.6   Smokeless tobacco: Former    Types: Chew   Tobacco comments:    has not used since age 40  Substance and Sexual Activity   Alcohol use: No    Alcohol/week: 0.0 standard drinks   Drug use: No   Sexual activity: Yes  Other Topics Concern   Not on file  Social History Narrative   Married, boy/girl twins age 28yr.   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   [place]   No T/A/Ds.   Cardiovasc exercise regularly.  Prudent diet.         Social Determinants of Health   Financial Resource Strain: Not on file  Food Insecurity: Not on file  Transportation Needs: Not on file  Physical Activity: Not on file  Stress: Not on file  Social Connections: Not on file  Intimate Partner Violence: Not on file    Outpatient Medications Prior to Visit  Medication Sig Dispense Refill   aspirin EC 81 MG tablet Take 1 tablet (81 mg total)  by mouth daily. 90 tablet 3   Calcium Citrate-Vitamin D 200-250 MG-UNIT TABS Take 1 tablet by mouth daily.     Cholecalciferol (VITAMIN D-3 PO) Take 2,000 Int'l Units/1.78mby mouth daily.     famotidine (PEPCID) 20 MG tablet Take 20 mg by mouth daily.     hydroxychloroquine (PLAQUENIL) 200 MG tablet Take by mouth. patient take 1 tablet bid x 5 days a week     levothyroxine (SYNTHROID) 50 MCG tablet TAKE 1 TABLET BY MOUTH ONCE DAILY BEFORE BREAKFAST 90 tablet 0   Multiple Vitamins-Minerals (ZINC PO) Take 1 capsule by mouth daily.     Omega-3 Fatty Acids (FISH OIL) 1000 MG CAPS Take 1 capsule by mouth daily.     omeprazole (PRILOSEC) 20 MG capsule Take 1 capsule (20 mg total) by mouth 2 (two) times daily before a meal. 180 capsule 0   rosuvastatin (CRESTOR) 40 MG tablet Take 1 tablet (40 mg total) by mouth daily. 90 tablet 3   TURMERIC PO Take 2,000 mg by mouth daily.     Ubiquinol 100 MG CAPS Take 1 capsule by mouth daily.     metoprolol succinate (TOPROL-XL) 50 MG 24 hr tablet Take 1 tablet (50 mg total) by mouth daily. Take with or immediately following a meal. 90 tablet 3   No facility-administered medications prior to visit.    Allergies  Allergen Reactions   Simvastatin Other (See Comments)    myalgias   Atorvastatin Other (See Comments)    Joint pain and low back pain    ROS See HPI    Objective:    Physical Exam Constitutional:      General: He is not in acute distress.    Appearance: He is well-developed.  HENT:     Head: Normocephalic and atraumatic.  Cardiovascular:     Rate and Rhythm: Normal rate and regular rhythm.     Heart sounds: No murmur heard. Pulmonary:     Effort: Pulmonary effort is normal. No respiratory distress.     Breath sounds: Normal breath sounds. No wheezing or rales.  Skin:    General: Skin is warm and dry.  Neurological:     Mental Status: He is alert and oriented to person, place, and time.  Psychiatric:        Behavior: Behavior  normal.        Thought Content: Thought content normal.    BP 108/78 (BP Location: Right Arm,  Patient Position: Sitting, Cuff Size: Large)   Pulse 66   Resp 20   Ht '6\' 1"'$  (1.854 m)   Wt 162 lb (73.5 kg)   SpO2 99%   BMI 21.37 kg/m  Wt Readings from Last 3 Encounters:  03/20/22 162 lb (73.5 kg)  02/16/22 166 lb (75.3 kg)  12/19/21 164 lb (74.4 kg)       Assessment & Plan:   Problem List Items Addressed This Visit       Unprioritized   Rheumatoid arthritis ([place])    Stable, follows with Rheumatology- Dr. Manuella Ghazi.        Hypothyroid    Clinically stable on synthroid. Obtain follow up tsh level.        Relevant Orders   TSH   GERD (gastroesophageal reflux disease)    Stable on pepcid/omeprazole. Continue same.        Coronary artery calcification seen on CT scan    Maintained on toprol xl per cardiology.  BP stable.        Other Visit Diagnoses     Dyslipidemia    -  Primary   Relevant Orders   Lipid panel       I am having [place] "Wille Glaser" maintain his Ubiquinol, TURMERIC PO, hydroxychloroquine, aspirin EC, Fish Oil, Calcium Citrate-Vitamin D, Multiple Vitamins-Minerals (ZINC PO), Cholecalciferol (VITAMIN D-3 PO), famotidine, metoprolol succinate, rosuvastatin, omeprazole, and levothyroxine.  No orders of the defined types were placed in this encounter.

## 2022-03-20 NOTE — Assessment & Plan Note (Signed)
Stable on pepcid/omeprazole. Continue same.

## 2022-03-20 NOTE — Assessment & Plan Note (Signed)
Stable, follows with Rheumatology- Dr. Manuella Ghazi.

## 2022-03-20 NOTE — Patient Instructions (Addendum)
Please add claritin '10mg'$  once daily.  Complete lab work prior to leaving.

## 2022-04-15 ENCOUNTER — Encounter: Payer: Self-pay | Admitting: Cardiology

## 2022-05-04 DIAGNOSIS — E559 Vitamin D deficiency, unspecified: Secondary | ICD-10-CM | POA: Diagnosis not present

## 2022-05-04 DIAGNOSIS — M0579 Rheumatoid arthritis with rheumatoid factor of multiple sites without organ or systems involvement: Secondary | ICD-10-CM | POA: Diagnosis not present

## 2022-05-04 DIAGNOSIS — Z7961 Long term (current) use of immunomodulator: Secondary | ICD-10-CM | POA: Diagnosis not present

## 2022-05-06 ENCOUNTER — Telehealth: Payer: Self-pay | Admitting: Family Medicine

## 2022-05-06 NOTE — Telephone Encounter (Signed)
Lab faxed to [facility]. Fax # [phone]

## 2022-05-06 NOTE — Telephone Encounter (Signed)
Pt called stating he would like the the TSH Lab results he had done on 5.19.23 forwarded to the following practice:   Barbette Hair, MD\ [facility] [address], [place], [place] [address] Phone: [phone]

## 2022-05-18 ENCOUNTER — Other Ambulatory Visit: Payer: Self-pay | Admitting: Family Medicine

## 2022-05-18 DIAGNOSIS — Z79899 Other long term (current) drug therapy: Secondary | ICD-10-CM | POA: Diagnosis not present

## 2022-05-18 DIAGNOSIS — M0609 Rheumatoid arthritis without rheumatoid factor, multiple sites: Secondary | ICD-10-CM | POA: Diagnosis not present

## 2022-05-19 ENCOUNTER — Ambulatory Visit: Payer: BC Managed Care – PPO | Admitting: Family Medicine

## 2022-05-25 ENCOUNTER — Other Ambulatory Visit: Payer: Self-pay | Admitting: Family Medicine

## 2022-06-05 DIAGNOSIS — M069 Rheumatoid arthritis, unspecified: Secondary | ICD-10-CM | POA: Diagnosis not present

## 2022-08-07 ENCOUNTER — Other Ambulatory Visit: Payer: Self-pay | Admitting: Family Medicine

## 2022-09-02 ENCOUNTER — Telehealth: Payer: Self-pay | Admitting: Cardiology

## 2022-09-02 NOTE — Telephone Encounter (Signed)
Patient stated he needs a letter that states that the stress test he had in 2013 was for baseline information; after he was DX with RA and had palpitations, he wore a Holter monitor as a precaution, and that he does not need to wear one every year. He based this on, "There is nothing wrong with my heart and the FAA needs a letter stating that."  He needs the letter before spring. Patient said he is willing to have an appointment with Dr. Stanford Breed to discuss this.

## 2022-09-02 NOTE — Telephone Encounter (Signed)
Patient called stating he needs a letter stating the stress test (13 years ago) and the monitor her wore we just for a baseline and for a precaution, and that he does not have a heart problem.  That he doesn't need to wear a monitor every year to get his [place] [facility].

## 2022-09-22 ENCOUNTER — Encounter: Payer: Self-pay | Admitting: *Deleted

## 2022-09-22 NOTE — Telephone Encounter (Signed)
Spoke with patient to inform him that Dr. Jacalyn Lefevre nurse will call him for more details and write the letter he needs. He was grateful.

## 2022-09-22 NOTE — Telephone Encounter (Signed)
Patient is calling back from update bout his letter. Please advise

## 2022-09-22 NOTE — Telephone Encounter (Signed)
Spoke with pt, letter generated and sent to patient via my chart to print.

## 2022-10-13 NOTE — Progress Notes (Unsigned)
    Tyler Schmidt D.[place] Phone: [phone]   Assessment and Plan:     There are no diagnoses linked to this encounter.  ***   Pertinent previous records reviewed include ***   Follow Up: ***     Subjective:   I, Tyler Schmidt, am serving as a Education administrator for Doctor Glennon Mac  Chief Complaint: left knee pain   HPI:   10/14/2022 Patient is a 56 year old male complaining of left knee pain. Patient states  Relevant Historical Information: ***  Additional pertinent review of systems negative.   Current Outpatient Medications:    aspirin EC 81 MG tablet, Take 1 tablet (81 mg total) by mouth daily., Disp: 90 tablet, Rfl: 3   Calcium Citrate-Vitamin D 200-250 MG-UNIT TABS, Take 1 tablet by mouth daily., Disp: , Rfl:    Cholecalciferol (VITAMIN D-3 PO), Take 2,000 Int'l Units/1.34m by mouth daily., Disp: , Rfl:    famotidine (PEPCID) 20 MG tablet, Take 20 mg by mouth daily., Disp: , Rfl:    hydroxychloroquine (PLAQUENIL) 200 MG tablet, Take by mouth. patient take 1 tablet bid x 5 days a week, Disp: , Rfl:    levothyroxine (SYNTHROID) 50 MCG tablet, Take 1 tablet (50 mcg total) by mouth daily before breakfast., Disp: 90 tablet, Rfl: 0   metoprolol succinate (TOPROL-XL) 50 MG 24 hr tablet, Take 1 tablet (50 mg total) by mouth daily. Take with or immediately following a meal., Disp: 90 tablet, Rfl: 3   Multiple Vitamins-Minerals (ZINC PO), Take 1 capsule by mouth daily., Disp: , Rfl:    Omega-3 Fatty Acids (FISH OIL) 1000 MG CAPS, Take 1 capsule by mouth daily., Disp: , Rfl:    omeprazole (PRILOSEC) 20 MG capsule, TAKE 1 CAPSULE BY MOUTH TWICE DAILY BEFORE A MEAL, Disp: 180 capsule, Rfl: 1   rosuvastatin (CRESTOR) 40 MG tablet, Take 1 tablet (40 mg total) by mouth daily., Disp: 90 tablet, Rfl: 3   TURMERIC PO, Take 2,000 mg by mouth daily., Disp: , Rfl:    Ubiquinol 100 MG CAPS, Take 1 capsule by mouth  daily., Disp: , Rfl:    Objective:     There were no vitals filed for this visit.    There is no height or weight on file to calculate BMI.    Physical Exam:    ***   Electronically signed by:  BBenito MccreedyD.OMarguerita MerlesSports Medicine 12:58 PM 10/13/22

## 2022-10-14 ENCOUNTER — Ambulatory Visit (INDEPENDENT_AMBULATORY_CARE_PROVIDER_SITE_OTHER): Payer: BC Managed Care – PPO

## 2022-10-14 ENCOUNTER — Ambulatory Visit (INDEPENDENT_AMBULATORY_CARE_PROVIDER_SITE_OTHER): Payer: BC Managed Care – PPO | Admitting: Sports Medicine

## 2022-10-14 VITALS — BP 122/78 | HR 68 | Ht 73.0 in | Wt 164.0 lb

## 2022-10-14 DIAGNOSIS — M25562 Pain in left knee: Secondary | ICD-10-CM

## 2022-10-14 DIAGNOSIS — G8929 Other chronic pain: Secondary | ICD-10-CM | POA: Diagnosis not present

## 2022-10-14 MED ORDER — MELOXICAM 15 MG PO TABS: 15.0000 mg | ORAL_TABLET | Freq: Every day | ORAL | 0 refills | Status: DC

## 2022-10-14 NOTE — Patient Instructions (Addendum)
Good to see you  - Start meloxicam 15 mg daily x2 weeks.  If still having pain after 2 weeks, complete 3rd-week of meloxicam. May use remaining meloxicam as needed once daily for pain control.  Do not to use additional NSAIDs while taking meloxicam.  May use Tylenol 500-1000 mg 2 to 3 times a day for breakthrough pain. Knee HEP  4 week follow up  

## 2022-11-03 ENCOUNTER — Other Ambulatory Visit: Payer: Self-pay | Admitting: Family Medicine

## 2022-11-11 ENCOUNTER — Ambulatory Visit: Payer: BC Managed Care – PPO | Admitting: Sports Medicine

## 2022-11-17 ENCOUNTER — Other Ambulatory Visit: Payer: Self-pay | Admitting: Family Medicine

## 2022-12-12 ENCOUNTER — Other Ambulatory Visit: Payer: Self-pay | Admitting: Family Medicine

## 2022-12-24 ENCOUNTER — Encounter: Payer: Self-pay | Admitting: Family Medicine

## 2022-12-29 NOTE — Telephone Encounter (Signed)
Called pt was advised and Pt scheduled  appt for 03/13/23 to discuss what he needs letter to say.

## 2023-01-05 ENCOUNTER — Encounter: Payer: Self-pay | Admitting: Family

## 2023-01-05 ENCOUNTER — Ambulatory Visit: Payer: No Typology Code available for payment source | Admitting: Family

## 2023-01-05 VITALS — BP 117/74 | HR 68 | Temp 98.0°F | Resp 16 | Wt 166.0 lb

## 2023-01-05 DIAGNOSIS — E785 Hyperlipidemia, unspecified: Secondary | ICD-10-CM | POA: Diagnosis not present

## 2023-01-05 DIAGNOSIS — E039 Hypothyroidism, unspecified: Secondary | ICD-10-CM | POA: Diagnosis not present

## 2023-01-05 DIAGNOSIS — M069 Rheumatoid arthritis, unspecified: Secondary | ICD-10-CM | POA: Diagnosis not present

## 2023-01-05 LAB — LIPID PANEL
Cholesterol: 173 mg/dL (ref 0–200)
HDL: 53.6 mg/dL (ref >39.00)
LDL Cholesterol: 97 mg/dL (ref 0–99)
NonHDL: 119.42
Total CHOL/HDL Ratio: 3
Triglycerides: 112 mg/dL (ref 0.0–149.0)
VLDL: 22.4 mg/dL (ref 0.0–40.0)

## 2023-01-05 LAB — TSH: TSH: 5.49 u[IU]/mL (ref 0.35–5.50)

## 2023-01-05 MED ORDER — OMEPRAZOLE 20 MG PO CPDR: DELAYED_RELEASE_CAPSULE | ORAL | 0 refills | Status: DC

## 2023-01-05 NOTE — Assessment & Plan Note (Signed)
Reports that plaquenil is very effective in controlling his symptoms. Management per rheumatology.

## 2023-01-05 NOTE — Assessment & Plan Note (Signed)
Lab Results  Component Value Date   CHOL 128 03/20/2022   HDL 46.10 03/20/2022   LDLCALC 56 03/20/2022   LDLDIRECT 172.0 12/17/2016   TRIG 130.0 03/20/2022   CHOLHDL 3 03/20/2022   Non-fasting.  Maintained on crestor.  He is taking '40mg'$  3x weekly.

## 2023-01-05 NOTE — Assessment & Plan Note (Addendum)
Lab Results  Component Value Date   TSH 3.94 03/20/2022   Clinically stable on synthroid.  Obtain follow up TSH.

## 2023-01-05 NOTE — Progress Notes (Signed)
Subjective:   By signing my name below, I, Tyler Schmidt, attest that this documentation has been prepared under the direction and in the presence of Tyler Pear, NP 01/05/23   Patient ID: Tyler Schmidt, male    DOB: Sep 07, 1966, 57 y.o.   MRN: [id number]  Chief Complaint  Patient presents with   Hypothyroidism    Here for follow up    HPI Patient is in today for an office visit.   Blood pressure: He has been managing his blood pressure. He is compliant with Metoprolol 50 mg daily.   Rheumatoid arthritis: He has been managing quite well. He reports having a bit of pain in his knuckles every now and then but attributes that to his age. He denies any major joint pain.   Cholesterol: He has been taking Rosuvastatin Monday, Wednesday, and Friday every week. He denies any joint pain associated with his statin.   Hypothyroidism: Since starting Synthroid he has noticed a significant improvement. He notes that he has not been experiencing brain fog like before.   Immunizations: He is not interested in receiving the flu vaccine today. He is still thinking about getting the shingrix on another day.   Refill: He has not been taking Pepcid due to trouble getting it from his pharmacy.   Past Medical History:  Diagnosis Date   Adrenal adenoma, left    Incidentaloma noted on noncontrast chest CT done to f/u lung nodule seen on chest radiograph (10/2016).   Allergy    Anxiety 08/12/2012   Atypical chest pain Fall 2013   Stress echo NORMAL 08/30/12   Barrett esophagus 2012   GERD (gastroesophageal reflux disease)    Hyperlipidemia    HDL high, LDL high: simvastatin resulted in prolonged/severe muscle pain, led to long w/u and pt wants to avoid further statin unless lipids severely worsen   Neck pain 08/12/2012   Nephrolithiasis 10/2016   Bilat, nonobstruct, noted on CT chest done for f/u of pulm nodule seen on chest radiograph   Palpitations Fall/winter 2017   Improved with  lopressor   Rheumatoid arthritis ([place])    Subclinical hypothyroidism    Patient give hx of hypfunctioning goiter in the distant past, says he was given pills to "kill" the goiter.  Says bx of goiter was benign.    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  Pt also reports normal ETT 09/2016.   ESOPHAGOGASTRODUODENOSCOPY  09/2011   Dr. Donnajean Lopes MONITOR  10/2016   No pathologic arrhythmias.  Occ PVCs that occ corresponded to his palpitations.      Family History  Problem Relation Age of Onset   Heart disease Father        Died of MI age 76, possible PE   Heart disease Paternal Uncle        MI's in his 66s   Heart disease Paternal Uncle    Colon cancer Neg Hx     Social History   Socioeconomic History   Marital status: Married    Spouse name: Not on file   Number of children: 2   Years of education: Not on file   Highest education level: Not on file  Occupational History    Employer: [employer]    Comment: Pilot  Tobacco Use   Smoking status:  Former    Types: Cigarettes    Quit date: 08/18/1979    Years since quitting: 43.4   Smokeless tobacco: Former    Types: Chew   Tobacco comments:    has not used since age 71  Substance and Sexual Activity   Alcohol use: No    Alcohol/week: 0.0 standard drinks of alcohol   Drug use: No   Sexual activity: Yes  Other Topics Concern   Not on file  Social History Narrative   Married, boy/girl twins age 58yr.   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   Originally from [place]   No T/A/Ds.   Cardiovasc exercise regularly.  Prudent diet.         Social Determinants of Health   Financial Resource Strain: Not on file  Food Insecurity: Not on file  Transportation Needs: Not on file  Physical Activity: Not on file  Stress: Not on file  Social Connections: Not on file   Intimate Partner Violence: Not on file    Outpatient Medications Prior to Visit  Medication Sig Dispense Refill   aspirin EC 81 MG tablet Take 1 tablet (81 mg total) by mouth daily. 90 tablet 3   Calcium Citrate-Vitamin D 200-250 MG-UNIT TABS Take 1 tablet by mouth daily.     Cholecalciferol (VITAMIN D-3 PO) Take 2,000 Int'l Units/1.779mby mouth daily.     hydroxychloroquine (PLAQUENIL) 200 MG tablet Take by mouth. patient take 1 tablet bid x 5 days a week     levothyroxine (SYNTHROID) 50 MCG tablet TAKE 1 TABLET BY MOUTH ONCE DAILY BEFORE  BREAKFAST 30 tablet 0   meloxicam (MOBIC) 15 MG tablet Take 1 tablet (15 mg total) by mouth daily. 30 tablet 0   Multiple Vitamins-Minerals (ZINC PO) Take 1 capsule by mouth daily.     Omega-3 Fatty Acids (FISH OIL) 1000 MG CAPS Take 1 capsule by mouth daily.     rosuvastatin (CRESTOR) 40 MG tablet Take 1 tablet (40 mg total) by mouth daily. 90 tablet 3   TURMERIC PO Take 2,000 mg by mouth daily.     Ubiquinol 100 MG CAPS Take 1 capsule by mouth daily.     omeprazole (PRILOSEC) 20 MG capsule TAKE 1 CAPSULE BY MOUTH TWICE DAILY BEFORE A MEAL 180 capsule 0   famotidine (PEPCID) 20 MG tablet Take 20 mg by mouth daily. (Patient not taking: Reported on 01/05/2023)     metoprolol succinate (TOPROL-XL) 50 MG 24 hr tablet Take 1 tablet (50 mg total) by mouth daily. Take with or immediately following a meal. 90 tablet 3   No facility-administered medications prior to visit.    Allergies  Allergen Reactions   Simvastatin Other (See Comments)    myalgias   Atorvastatin Other (See Comments)    Joint pain and low back pain    Review of Systems  Musculoskeletal:  Positive for joint pain (mild pain in hands -- he attributes to aging).       Objective:    Physical Exam Constitutional:      General: He is awake. He is not in acute distress.    Appearance: Normal appearance.  HENT:     Head: Normocephalic and atraumatic.  Pulmonary:     Effort:  Pulmonary effort is normal.  Skin:    General: Skin is warm and dry.  Neurological:     Mental Status: He is alert and oriented to person, place, and time.     Cranial Nerves: No facial asymmetry.  Psychiatric:        Mood and Affect: Mood normal.        Speech: Speech normal.     BP 117/74 (BP Location: Right Arm, Patient Position: Sitting, Cuff Size: Small)   Pulse 68   Temp 98 F (36.7 C) (Oral)   Resp 16   Wt 166 lb (75.3 kg)   SpO2 98%   BMI 21.90 kg/m  Wt Readings from Last 3 Encounters:  01/05/23 166 lb (75.3 kg)  10/14/22 164 lb (74.4 kg)  03/20/22 162 lb (73.5 kg)       Assessment & Plan:  Rheumatoid arthritis involving multiple sites, unspecified whether rheumatoid factor present [facility]) Assessment & Plan: Reports that plaquenil is very effective in controlling his symptoms. Management per rheumatology.    Hyperlipidemia, unspecified hyperlipidemia type Assessment & Plan: Lab Results  Component Value Date   CHOL 128 03/20/2022   HDL 46.10 03/20/2022   LDLCALC 56 03/20/2022   LDLDIRECT 172.0 12/17/2016   TRIG 130.0 03/20/2022   CHOLHDL 3 03/20/2022   Non-fasting.  Maintained on crestor.  He is taking '40mg'$  3x weekly.     Orders: -     Lipid panel  Hypothyroidism, unspecified type Assessment & Plan: Lab Results  Component Value Date   TSH 3.94 03/20/2022   Clinically stable on synthroid.  Obtain follow up TSH.   Orders: -     TSH  Other orders -     Omeprazole; TAKE 1 CAPSULE BY MOUTH TWICE DAILY BEFORE A MEAL  Dispense: 180 capsule; Refill: 0     I,Rachel Rivera,acting as a scribe for Tyler Pear, NP.,have documented all relevant documentation on the behalf of Tyler Pear, NP,as directed by  Tyler Pear, NP while in the presence of Tyler Pear, NP.   I, Tyler Pear, NP, personally preformed the services described in this documentation.  All medical record entries made by the scribe were at my  direction and in my presence.  I have reviewed the chart and discharge instructions (if applicable) and agree that the record reflects my personal performance and is accurate and complete. 01/05/23   Tyler Pear, NP

## 2023-01-06 ENCOUNTER — Telehealth: Payer: Self-pay | Admitting: Family

## 2023-01-06 MED ORDER — LEVOTHYROXINE SODIUM 75 MCG PO TABS: 75.0000 ug | ORAL_TABLET | Freq: Every day | ORAL | 0 refills | Status: DC

## 2023-01-06 NOTE — Telephone Encounter (Signed)
Please advise pt that I would like for him to increase his synthroid from 61mg to 725m once daily. Repeat TSH in 6 weeks. Dx hypothyroid. [facility]

## 2023-01-06 NOTE — Telephone Encounter (Signed)
Called patient but no answer, left voice mail for patinet to call back.

## 2023-01-08 NOTE — Telephone Encounter (Signed)
noted 

## 2023-01-08 NOTE — Telephone Encounter (Signed)
Patient called back stating he is aware of the new medication and already picked it up. Informed patient that he needs to come in for lab work in 6 weeks. Appt was made and pt aware.

## 2023-01-19 NOTE — Telephone Encounter (Signed)
Called patient to get him schedule for a visit for an xray order

## 2023-01-26 ENCOUNTER — Ambulatory Visit (HOSPITAL_BASED_OUTPATIENT_CLINIC_OR_DEPARTMENT_OTHER)
Admission: RE | Admit: 2023-01-26 | Discharge: 2023-01-26 | Disposition: A | Discharge status: Home / Self Care | Payer: No Typology Code available for payment source | Source: Ambulatory Visit | Attending: Family | Admitting: Family

## 2023-01-26 ENCOUNTER — Ambulatory Visit (INDEPENDENT_AMBULATORY_CARE_PROVIDER_SITE_OTHER): Payer: No Typology Code available for payment source | Admitting: Family

## 2023-01-26 VITALS — BP 121/78 | HR 68 | Temp 97.9°F | Resp 16 | Wt 167.0 lb

## 2023-01-26 DIAGNOSIS — Z87442 Personal history of urinary calculi: Secondary | ICD-10-CM | POA: Diagnosis not present

## 2023-01-26 NOTE — Progress Notes (Signed)
Subjective:   By signing my name below, I, Tyler Schmidt, attest that this documentation has been prepared under the direction and in the presence of Tyler Alar, NP.  01/26/2023.   Patient ID: Tyler Schmidt, male    DOB: [birth year], 57 y.o.   MRN: [id number]  Chief Complaint  Patient presents with   Nephrolithiasis    Patient requesting x-ray due to history of renal stones. 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  X-ray requirement for FAA:  Today he is requesting an x-ray to evaluate for nephrolithiasis per the FAA. He works as a Insurance underwriter.  Nephrolithiasis:  He does have a history of kidney stones. In his entire life he has had 2 kidney stones, last was 5-6 years ago around the same time he was diagnosed with rheumatoid. His prior kidney stones passed naturally. At this time, he denies any lower back pain, dysuria, or hematuria.    Past Medical History:  Diagnosis Date   Adrenal adenoma, left    Incidentaloma noted on noncontrast chest CT done to f/u lung nodule seen on chest radiograph (10/2016).   Allergy    Anxiety 08/12/2012   Atypical chest pain Fall 2013   Stress echo NORMAL 08/30/12   Barrett esophagus 2012   GERD (gastroesophageal reflux disease)    Hyperlipidemia    HDL high, LDL high: simvastatin resulted in prolonged/severe muscle pain, led to long w/u and pt wants to avoid further statin unless lipids severely worsen   Neck pain 08/12/2012   Nephrolithiasis 10/2016   Bilat, nonobstruct, noted on CT chest done for f/u of pulm nodule seen on chest radiograph   Palpitations Fall/winter 2017   Improved with lopressor   Rheumatoid arthritis ([place])    Subclinical hypothyroidism    Patient give hx of hypfunctioning goiter in the distant past, says he was given pills to "kill" the goiter.  Says bx of goiter was benign.    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 Doreatha Lew)   CARDIOVASCULAR STRESS TEST  08/2012   Stress echo normal.  Pt also reports normal ETT 09/2016.   ESOPHAGOGASTRODUODENOSCOPY  09/2011   Dr. Donnajean Lopes MONITOR  10/2016   No pathologic arrhythmias.  Occ PVCs that occ corresponded to his palpitations.      Family History  Problem Relation Age of Onset   Heart disease Father        Died of MI age 95, possible PE   Heart disease Paternal Uncle        MI's in his 61s   Heart disease Paternal Uncle    Colon cancer Neg Hx     Social History   Socioeconomic History   Marital status: Married    Spouse name: Not on file   Number of children: 2   Years of education: Not on file   Highest education level: Associate degree: occupational, Hotel manager, or vocational program  Occupational History    Employer: [employer]    CommentProduct/process development scientist  Tobacco Use   Smoking status: Former    Types: Cigarettes    Quit date: 08/18/1979    Years since quitting: 43.4   Smokeless tobacco: Former    Types: Chew   Tobacco comments:    has not used since age 78  Substance and Sexual  Activity   Alcohol use: No    Alcohol/week: 0.0 standard drinks of alcohol   Drug use: No   Sexual activity: Yes  Other Topics Concern   Not on file  Social History Narrative   Married, boy/girl twins age 72yrs.   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   Originally from [place].   No T/A/Ds.   Cardiovasc exercise regularly.  Prudent diet.         Social Determinants of Health   Financial Resource Strain: Low Risk  (01/26/2023)   Overall Financial Resource Strain (CARDIA)    Difficulty of Paying Living Expenses: Not hard at all  Food Insecurity: No Food Insecurity (01/26/2023)   Hunger Vital Sign    Worried About Running Out of Food in the Last Year: Never true    Ran Out of Food in the Last Year: Never true  Transportation Needs: No Transportation Needs (01/26/2023)   PRAPARE - Radiographer, therapeutic (Medical): No    Lack of Transportation (Non-Medical): No  Physical Activity: Insufficiently Active (01/26/2023)   Exercise Vital Sign    Days of Exercise per Week: 3 days    Minutes of Exercise per Session: 10 min  Stress: No Stress Concern Present (01/26/2023)   [place]    Feeling of Stress : Only a little  Social Connections: Socially Integrated (01/26/2023)   Social Connection and Isolation Panel [NHANES]    Frequency of Communication with Friends and Family: More than three times a week    Frequency of Social Gatherings with Friends and Family: Twice a week    Attends Religious Services: 1 to 4 times per year    Active Member of [employer] or Organizations: Yes    Attends Music therapist: More than 4 times per year    Marital Status: Married  Human resources officer Violence: Not on file    Outpatient Medications Prior to Visit  Medication Sig Dispense Refill   aspirin EC 81 MG tablet Take 1 tablet (81 mg total) by mouth daily. 90 tablet 3   Cholecalciferol (VITAMIN D-3 PO) Take 2,000 Int'l Units/1.28m2 by mouth daily.     famotidine (PEPCID) 20 MG tablet Take 20 mg by mouth daily.     hydroxychloroquine (PLAQUENIL) 200 MG tablet Take by mouth. patient take 1 tablet bid x 5 days a week     levothyroxine (SYNTHROID) 75 MCG tablet Take 1 tablet (75 mcg total) by mouth daily. 90 tablet 0   Multiple Vitamins-Minerals (ZINC PO) Take 1 capsule by mouth daily.     Omega-3 Fatty Acids (FISH OIL) 1000 MG CAPS Take 1 capsule by mouth daily.     omeprazole (PRILOSEC) 20 MG capsule TAKE 1 CAPSULE BY MOUTH TWICE DAILY BEFORE A MEAL 180 capsule 0   rosuvastatin (CRESTOR) 40 MG tablet Take 1 tablet (40 mg total) by mouth daily. 90 tablet 3   TURMERIC PO Take 2,000 mg by mouth daily.     Ubiquinol 100 MG CAPS Take 1 capsule by mouth daily.     metoprolol succinate (TOPROL-XL) 50 MG 24 hr tablet Take 1 tablet (50  mg total) by mouth daily. Take with or immediately following a meal. 90 tablet 3   Calcium Citrate-Vitamin D 200-250 MG-UNIT TABS Take 1 tablet by mouth daily.     meloxicam (MOBIC) 15 MG tablet Take 1 tablet (15 mg total) by mouth daily. 30 tablet 0   No facility-administered medications  prior to visit.    Allergies  Allergen Reactions   Simvastatin Other (See Comments)    myalgias   Atorvastatin Other (See Comments)    Joint pain and low back pain    ROS See HPI.     Objective:    Physical Exam Constitutional:      General: He is not in acute distress.    Appearance: Normal appearance. He is not ill-appearing.  HENT:     Head: Normocephalic and atraumatic.     Right Ear: Tympanic membrane, ear canal and external ear normal.     Left Ear: Tympanic membrane, ear canal and external ear normal.  Eyes:     Extraocular Movements: Extraocular movements intact.     Pupils: Pupils are equal, round, and reactive to light.  Cardiovascular:     Rate and Rhythm: Normal rate and regular rhythm.     Heart sounds: Normal heart sounds. No murmur heard.    No gallop.  Pulmonary:     Effort: Pulmonary effort is normal. No respiratory distress.     Breath sounds: Normal breath sounds. No wheezing or rales.  Skin:    General: Skin is warm and dry.  Neurological:     General: No focal deficit present.     Mental Status: He is alert and oriented to person, place, and time.  Psychiatric:        Mood and Affect: Mood normal.        Behavior: Behavior normal.     BP 121/78 (BP Location: Right Arm, Patient Position: Sitting, Cuff Size: Small)   Pulse 68   Temp 97.9 F (36.6 C) (Oral)   Resp 16   Wt 167 lb (75.8 kg)   SpO2 97%   BMI 22.03 kg/m  Wt Readings from Last 3 Encounters:  01/26/23 167 lb (75.8 kg)  01/05/23 166 lb (75.3 kg)  10/14/22 164 lb (74.4 kg)       Assessment & Plan:   Problem List Items Addressed This Visit       Unprioritized   History of kidney stones  - Primary    No current symptoms. Needs x-ray for FAA.  Will order today.       Relevant Orders   DG Abd 2 Views     No orders of the defined types were placed in this encounter.   I, [name], NP, personally preformed the services described in this documentation.  All medical record entries made by the scribe were at my direction and in my presence.  I have reviewed the chart and discharge instructions (if applicable) and agree that the record reflects my personal performance and is accurate and complete. 01/26/2023.  I,Mathew Stumpf,acting as a Education administrator for [employer], NP.,have documented all relevant documentation on the behalf of [name], NP,as directed by  [name], NP while in the presence of [name], NP.   [name], NP

## 2023-01-26 NOTE — Assessment & Plan Note (Signed)
No current symptoms. Needs x-ray for FAA.  Will order today.

## 2023-02-05 ENCOUNTER — Other Ambulatory Visit: Payer: Self-pay | Admitting: Cardiology

## 2023-02-11 ENCOUNTER — Ambulatory Visit: Payer: No Typology Code available for payment source | Admitting: Family Medicine

## 2023-02-19 ENCOUNTER — Other Ambulatory Visit (INDEPENDENT_AMBULATORY_CARE_PROVIDER_SITE_OTHER): Payer: No Typology Code available for payment source

## 2023-02-19 DIAGNOSIS — E039 Hypothyroidism, unspecified: Secondary | ICD-10-CM

## 2023-02-19 LAB — TSH: TSH: 1.72 u[IU]/mL (ref 0.35–5.50)

## 2023-03-14 ENCOUNTER — Other Ambulatory Visit: Payer: Self-pay | Admitting: Family

## 2023-03-17 NOTE — Telephone Encounter (Signed)
Follow up was schedule

## 2023-04-14 ENCOUNTER — Other Ambulatory Visit: Payer: Self-pay | Admitting: Cardiology

## 2023-04-23 ENCOUNTER — Telehealth: Payer: Self-pay | Admitting: Cardiology

## 2023-04-23 DIAGNOSIS — R002 Palpitations: Secondary | ICD-10-CM

## 2023-04-23 MED ORDER — METOPROLOL SUCCINATE ER 50 MG PO TB24: ORAL_TABLET | ORAL | 0 refills | Status: DC

## 2023-04-23 NOTE — Telephone Encounter (Signed)
*  STAT* If patient is at the pharmacy, call can be transferred to refill team.   1. Which medications need to be refilled? (please list name of each medication and dose if known) metoprolol succinate (TOPROL-XL) 50 MG 24 hr tablet   2. Which pharmacy/location (including street and city if local pharmacy) is medication to be sent to? [facility] [address] [place] - [address]   3. Do they need a 30 day or 90 day supply? 90  Patient has appt schedule for 06/03/23

## 2023-04-23 NOTE — Telephone Encounter (Signed)
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  He said he needs to have his physical by the end of August.  He does have any appt scheduled with Lorin Picket on 06/03/23.

## 2023-04-23 NOTE — Telephone Encounter (Signed)
Called patient.  LVM to call Monday for reasoning for monitor.

## 2023-04-23 NOTE — Telephone Encounter (Signed)
Pt scheduled to see Joni Reining, NP, 06/03/23.  Refill for Metoprolol has been sent to [facility].

## 2023-04-26 NOTE — Telephone Encounter (Signed)
Patient was returning call. Please advise ?

## 2023-04-26 NOTE — Telephone Encounter (Signed)
Patient called back.  He states this year the FAA is requiring him to have an 4 hour Holter monitor. Patient states the FAA-  reviewed his past medical record- it showed that he is taking Metoprolol  and has had an Stress test in the past. Per patient , he does not have any heart problems, this is what Dr Jens Som told me.  Patient staes he will go along with the FAA this year,but will try to find out what is needed for the future.  Patient would like to have monitor completed prior to appointment on 06/03/23,so result can be discuss. He would like ample time to get information to FAA. He states his medical clearance ends the first part of Aug. 2024.  Patient aware that an order will need to be placed by  Dr Jens Som or provider he will be seeing.  Aware  message will be deferred and he will be contacted

## 2023-04-27 ENCOUNTER — Encounter: Payer: Self-pay | Admitting: *Deleted

## 2023-04-27 ENCOUNTER — Ambulatory Visit: Payer: No Typology Code available for payment source | Attending: Cardiology

## 2023-04-27 DIAGNOSIS — R002 Palpitations: Secondary | ICD-10-CM

## 2023-04-27 NOTE — Progress Notes (Unsigned)
Enrolled patient for a 3 day Zio XT monitor to be mailed to patients home  

## 2023-04-27 NOTE — Telephone Encounter (Signed)
Spoke with pt, aware monitor will be mailed to your home. Instructions sent to patient via my chart.

## 2023-05-10 ENCOUNTER — Telehealth: Payer: Self-pay | Admitting: Family Medicine

## 2023-05-10 NOTE — Telephone Encounter (Signed)
Sent pt mychart to call to make office visit to get lab recheck.

## 2023-05-10 NOTE — Telephone Encounter (Signed)
Patient called and would to retake blood test as he took it for work and states it is out of wack. He would like to run the tests again. Please advise

## 2023-05-11 ENCOUNTER — Other Ambulatory Visit: Payer: No Typology Code available for payment source

## 2023-05-15 DIAGNOSIS — R002 Palpitations: Secondary | ICD-10-CM | POA: Diagnosis not present

## 2023-05-31 NOTE — Progress Notes (Deleted)
Cardiology Clinic Note   Patient Name: Tyler Schmidt Date of Encounter: 05/31/2023  Primary Care Provider:  Bradd Canary, MD Primary Cardiologist:  Olga Millers, MD  Patient Profile    57 year old male with history of palpitations on metoprolol, coronary calcium, hyperlipemia.    Past Medical History    Past Medical History:  Diagnosis Date   Adrenal adenoma, left    Incidentaloma noted on noncontrast chest CT done to f/u lung nodule seen on chest radiograph (10/2016).   Allergy    Anxiety 08/12/2012   Atypical chest pain Fall 2013   Stress echo NORMAL 08/30/12   Barrett esophagus 2012   GERD (gastroesophageal reflux disease)    Hyperlipidemia    HDL high, LDL high: simvastatin resulted in prolonged/severe muscle pain, led to long w/u and pt wants to avoid further statin unless lipids severely worsen   Neck pain 08/12/2012   Nephrolithiasis 10/2016   Bilat, nonobstruct, noted on CT chest done for f/u of pulm nodule seen on chest radiograph   Palpitations Fall/winter 2017   Improved with lopressor   Rheumatoid arthritis ([facility])    Subclinical hypothyroidism    Patient give hx of hypfunctioning goiter in the distant past, says he was given pills to "kill" the goiter.  Says bx of goiter was benign.   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 Deborah Chalk)   CARDIOVASCULAR STRESS TEST  08/2012   Stress echo normal.  Pt also reports normal ETT 09/2016.   ESOPHAGOGASTRODUODENOSCOPY  09/2011   Dr. Audelia Hives MONITOR  10/2016   No pathologic arrhythmias.  Occ PVCs that occ corresponded to his palpitations.      Allergies  Allergies  Allergen Reactions   Simvastatin Other (See Comments)    myalgias   Atorvastatin Other (See Comments)    Joint pain and low back pain    History of Present Illness    ***  Home Medications    Current Outpatient Medications   Medication Sig Dispense Refill   aspirin EC 81 MG tablet Take 1 tablet (81 mg total) by mouth daily. 90 tablet 3   Cholecalciferol (VITAMIN D-3 PO) Take 2,000 Int'l Units/1.1m2 by mouth daily.     famotidine (PEPCID) 20 MG tablet Take 20 mg by mouth daily.     hydroxychloroquine (PLAQUENIL) 200 MG tablet Take by mouth. patient take 1 tablet bid x 5 days a week     levothyroxine (SYNTHROID) 75 MCG tablet Take 1 tablet by mouth once daily 90 tablet 0   metoprolol succinate (TOPROL-XL) 50 MG 24 hr tablet TAKE 1 TABLET BY MOUTH ONCE DAILY WITH OR IMMEDIATELY FOLLOWING A MEAL. APPOINTMENT REQUIRED FOR FUTURE REFILLS 45 tablet 0   Multiple Vitamins-Minerals (ZINC PO) Take 1 capsule by mouth daily.     Omega-3 Fatty Acids (FISH OIL) 1000 MG CAPS Take 1 capsule by mouth daily.     omeprazole (PRILOSEC) 20 MG capsule TAKE 1 CAPSULE BY MOUTH TWICE DAILY BEFORE A MEAL 180 capsule 0   rosuvastatin (CRESTOR) 40 MG tablet Take 1 tablet (40 mg total) by mouth daily. 90 tablet 3   TURMERIC PO Take 2,000 mg by mouth daily.     Ubiquinol 100 MG CAPS Take 1 capsule by mouth daily.     No current facility-administered medications for this visit.     Family History    Family  History  Problem Relation Age of Onset   Heart disease Father        Died of MI age 74, possible PE   Heart disease Paternal Uncle        MI's in his 35s   Heart disease Paternal Uncle    Colon cancer Neg Hx    He indicated that his mother is alive. He indicated that his father is deceased. He indicated that his maternal grandmother is deceased. He indicated that his maternal grandfather is deceased. He indicated that his paternal grandmother is deceased. He indicated that his paternal grandfather is deceased. He indicated that his daughter is alive. He indicated that his son is alive. He indicated that both of his paternal uncles are deceased. He indicated that the status of his neg hx is unknown.  Social History    Social History    Socioeconomic History   Marital status: Married    Spouse name: Not on file   Number of children: 2   Years of education: Not on file   Highest education level: Associate degree: occupational, Scientist, product/process development, or vocational program  Occupational History    Employer: [employer]    CommentBuilding services engineer  Tobacco Use   Smoking status: Former    Current packs/day: 0.00    Types: Cigarettes    Quit date: 08/18/1979    Years since quitting: 43.8   Smokeless tobacco: Former    Types: Chew   Tobacco comments:    has not used since age 63  Substance and Sexual Activity   Alcohol use: No    Alcohol/week: 0.0 standard drinks of alcohol   Drug use: No   Sexual activity: Yes  Other Topics Concern   Not on file  Social History Narrative   Married, boy/girl twins age 6yrs.   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   Originally from [place].   No T/A/Ds.   Cardiovasc exercise regularly.  Prudent diet.         Social Determinants of Health   Financial Resource Strain: Low Risk  (01/26/2023)   Overall Financial Resource Strain (CARDIA)    Difficulty of Paying Living Expenses: Not hard at all  Food Insecurity: No Food Insecurity (01/26/2023)   Hunger Vital Sign    Worried About Running Out of Food in the Last Year: Never true    Ran Out of Food in the Last Year: Never true  Transportation Needs: No Transportation Needs (01/26/2023)   PRAPARE - Administrator, Civil Service (Medical): No    Lack of Transportation (Non-Medical): No  Physical Activity: Insufficiently Active (01/26/2023)   Exercise Vital Sign    Days of Exercise per Week: 3 days    Minutes of Exercise per Session: 10 min  Stress: No Stress Concern Present (01/26/2023)   [employer] of Occupational Health - Occupational Stress Questionnaire    Feeling of Stress : Only a little  Social Connections: Socially Integrated (01/26/2023)   Social Connection and Isolation Panel [NHANES]    Frequency of Communication with Friends and  Family: More than three times a week    Frequency of Social Gatherings with Friends and Family: Twice a week    Attends Religious Services: 1 to 4 times per year    Active Member of [employer] or Organizations: Yes    Attends Banker Meetings: More than 4 times per year    Marital Status: Married  Intimate Partner Violence: Unknown (02/05/2022)   Received from [facility]  HITS    Physically Hurt: Not on file    Insult or Talk Down To: Not on file    Threaten Physical Harm: Not on file    Scream or Curse: Not on file     Review of Systems    General:  No chills, fever, night sweats or weight changes.  Cardiovascular:  No chest pain, dyspnea on exertion, edema, orthopnea, palpitations, paroxysmal nocturnal dyspnea. Dermatological: No rash, lesions/masses Respiratory: No cough, dyspnea Urologic: No hematuria, dysuria Abdominal:   No nausea, vomiting, diarrhea, bright red blood per rectum, melena, or hematemesis Neurologic:  No visual changes, wkns, changes in mental status. All other systems reviewed and are otherwise negative except as noted above.       Physical Exam    VS:  There were no vitals taken for this visit. , BMI There is no height or weight on file to calculate BMI.     GEN: Well nourished, well developed, in no acute distress. HEENT: normal. Neck: Supple, no JVD, carotid bruits, or masses. Cardiac: RRR, no murmurs, rubs, or gallops. No clubbing, cyanosis, edema.  Radials/DP/PT 2+ and equal bilaterally.  Respiratory:  Respirations regular and unlabored, clear to auscultation bilaterally. GI: Soft, nontender, nondistended, BS + x 4. MS: no deformity or atrophy. Skin: warm and dry, no rash. Neuro:  Strength and sensation are intact. Psych: Normal affect.      Lab Results  Component Value Date   WBC 6.3 02/16/2022   HGB 14.2 02/16/2022   HCT 40.6 02/16/2022   MCV 85 02/16/2022   PLT 211 02/16/2022   Lab Results  Component Value Date   CREATININE  1.10 02/16/2022   BUN 13 02/16/2022   NA 141 02/16/2022   K 4.5 02/16/2022   CL 103 02/16/2022   CO2 23 02/16/2022   Lab Results  Component Value Date   ALT 28 02/16/2022   AST 21 02/16/2022   ALKPHOS 57 02/16/2022   BILITOT 0.8 02/16/2022   Lab Results  Component Value Date   CHOL 173 01/05/2023   HDL 53.60 01/05/2023   LDLCALC 97 01/05/2023   LDLDIRECT 172.0 12/17/2016   TRIG 112.0 01/05/2023   CHOLHDL 3 01/05/2023    Lab Results  Component Value Date   HGBA1C 5.3 05/13/2020     Review of Prior Studies      Assessment & Plan   1.  ***     {Are you ordering a CV Procedure (e.g. stress test, cath, DCCV, TEE, etc)?   Press F2        :[id number]}   Signed, Bettey Mare. Liborio Nixon, ANP, AACC   05/31/2023 7:46 AM      Office [phone] Fax [phone]  Notice: This dictation was prepared with Dragon dictation along with smaller phrase technology. Any transcriptional errors that result from this process are unintentional and may not be corrected upon review.

## 2023-06-01 ENCOUNTER — Ambulatory Visit: Payer: No Typology Code available for payment source | Attending: Adult Health | Admitting: Adult Health

## 2023-06-01 ENCOUNTER — Encounter: Payer: Self-pay | Admitting: Adult Health

## 2023-06-01 VITALS — BP 124/84 | HR 66 | Ht 72.0 in | Wt 164.0 lb

## 2023-06-01 DIAGNOSIS — R002 Palpitations: Secondary | ICD-10-CM | POA: Diagnosis not present

## 2023-06-01 DIAGNOSIS — I2510 Atherosclerotic heart disease of native coronary artery without angina pectoris: Secondary | ICD-10-CM | POA: Diagnosis not present

## 2023-06-01 DIAGNOSIS — E7800 Pure hypercholesterolemia, unspecified: Secondary | ICD-10-CM

## 2023-06-01 NOTE — Patient Instructions (Signed)
Medication Instructions:  No Changes *If you need a refill on your cardiac medications before your next appointment, please call your pharmacy*   Lab Work: No Labs If you have labs (blood work) drawn today and your tests are completely normal, you will receive your results only by: MyChart Message (if you have MyChart) OR A paper copy in the mail If you have any lab test that is abnormal or we need to change your treatment, we will call you to review the results.   Testing/Procedures: No Testing   Follow-Up: At [facility], you and your health needs are our priority.  As part of our continuing mission to provide you with exceptional heart care, we have created designated Provider Care Teams.  These Care Teams include your primary Cardiologist (physician) and Advanced Practice Providers (APPs -  Physician Assistants and Nurse Practitioners) who all work together to provide you with the care you need, when you need it.  We recommend signing up for the patient portal called "MyChart".  Sign up information is provided on this After Visit Summary.  MyChart is used to connect with patients for Virtual Visits (Telemedicine).  Patients are able to view lab/test results, encounter notes, upcoming appointments, etc.  Non-urgent messages can be sent to your provider as well.   To learn more about what you can do with MyChart, go to [email or web address].    Your next appointment:   1 year(s)  Provider:   Olga Millers, MD  ( MD Only)

## 2023-06-01 NOTE — Progress Notes (Signed)
Cardiology Clinic Note   Patient Name: Silvino Rusin Date of Encounter: 06/01/2023  Primary Care Provider:  Bradd Canary, MD Primary Cardiologist:  Olga Millers, MD  Patient Profile    57 year old male with history of coronary artery calcifications noted on previous CT scan in 2019, being treated medically with statin therapy, with functional study showing no ischemia, palpitations, hyperlipidemia on rosuvastatin 40 mg daily with goal of LDL less than 100, last seen by Dr. Jens Som in April 2003 for [facility] evaluation. ,  Past Medical History    Past Medical History:  Diagnosis Date   Adrenal adenoma, left    Incidentaloma noted on noncontrast chest CT done to f/u lung nodule seen on chest radiograph (10/2016).   Allergy    Anxiety 08/12/2012   Atypical chest pain Fall 2013   Stress echo NORMAL 08/30/12   Barrett esophagus 2012   GERD (gastroesophageal reflux disease)    Hyperlipidemia    HDL high, LDL high: simvastatin resulted in prolonged/severe muscle pain, led to long w/u and pt wants to avoid further statin unless lipids severely worsen   Neck pain 08/12/2012   Nephrolithiasis 10/2016   Bilat, nonobstruct, noted on CT chest done for f/u of pulm nodule seen on chest radiograph   Palpitations Fall/winter 2017   Improved with lopressor   Rheumatoid arthritis (HCC)    Subclinical hypothyroidism    Patient give hx of hypfunctioning goiter in the distant past, says he was given pills to "kill" the goiter.  Says bx of goiter was benign.   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 Deborah Chalk)   CARDIOVASCULAR STRESS TEST  08/2012   Stress echo normal.  Pt also reports normal ETT 09/2016.   ESOPHAGOGASTRODUODENOSCOPY  09/2011   Dr. Audelia Hives MONITOR  10/2016   No pathologic arrhythmias.  Occ PVCs that occ corresponded to his palpitations.      Allergies  Allergies   Allergen Reactions   Simvastatin Other (See Comments)    myalgias   Atorvastatin Other (See Comments)    Joint pain and low back pain    History of Present Illness    Mr. 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  When last seen by Dr. Jens Som in April 2023 he was in excellent health medically compliant and was without complaints.  He has had no complaints of palpitations, shortness of breath, chest pain, dizziness, near syncope, or profound fatigue.   Home Medications    Current Outpatient Medications  Medication Sig Dispense Refill   aspirin EC 81 MG tablet Take 1 tablet (81 mg total) by mouth daily. 90 tablet 3   Cholecalciferol (VITAMIN D-3 PO) Take 2,000 Int'l Units/1.66m2 by mouth daily.     hydroxychloroquine (PLAQUENIL) 200 MG tablet Take by mouth. patient take 1 tablet bid x 5 days a week     levothyroxine (SYNTHROID) 75 MCG tablet Take 1 tablet by mouth once daily 90 tablet 0   metoprolol succinate (TOPROL-XL) 50 MG 24 hr tablet TAKE 1 TABLET BY MOUTH ONCE DAILY WITH OR IMMEDIATELY FOLLOWING A MEAL. APPOINTMENT REQUIRED FOR FUTURE REFILLS 45 tablet 0   Multiple Vitamins-Minerals (ZINC PO) Take 1 capsule by mouth daily.     Omega-3 Fatty Acids (FISH OIL) 1000 MG CAPS Take 1 capsule by mouth daily.     omeprazole (  PRILOSEC) 20 MG capsule TAKE 1 CAPSULE BY MOUTH TWICE DAILY BEFORE A MEAL 180 capsule 0   rosuvastatin (CRESTOR) 40 MG tablet Take 1 tablet (40 mg total) by mouth daily. 90 tablet 3   TURMERIC PO Take 2,000 mg by mouth daily.     Ubiquinol 100 MG CAPS Take 1 capsule by mouth daily.     famotidine (PEPCID) 20 MG tablet Take 20 mg by mouth daily.     No current facility-administered medications for this visit.     Family History    Family History  Problem Relation Age of Onset   Heart disease Father        Died of MI age 55, possible PE   Heart disease Paternal Uncle         MI's in his 86s   Heart disease Paternal Uncle    Colon cancer Neg Hx    He indicated that his mother is alive. He indicated that his father is deceased. He indicated that his maternal grandmother is deceased. He indicated that his maternal grandfather is deceased. He indicated that his paternal grandmother is deceased. He indicated that his paternal grandfather is deceased. He indicated that his daughter is alive. He indicated that his son is alive. He indicated that both of his paternal uncles are deceased. He indicated that the status of his neg hx is unknown.  Social History    Social History   Socioeconomic History   Marital status: Married    Spouse name: Not on file   Number of children: 2   Years of education: Not on file   Highest education level: Associate degree: occupational, Scientist, product/process development, or vocational program  Occupational History    Employer: [employer]    CommentBuilding services engineer  Tobacco Use   Smoking status: Former    Current packs/day: 0.00    Types: Cigarettes    Quit date: 08/18/1979    Years since quitting: 43.8   Smokeless tobacco: Former    Types: Chew   Tobacco comments:    has not used since age 57  Substance and Sexual Activity   Alcohol use: No    Alcohol/week: 0.0 standard drinks of alcohol   Drug use: No   Sexual activity: Yes  Other Topics Concern   Not on file  Social History Narrative   Married, boy/girl twins age 55yrs.   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   Originally from [place].   No T/A/Ds.   Cardiovasc exercise regularly.  Prudent diet.         Social Determinants of Health   Financial Resource Strain: Low Risk  (01/26/2023)   Overall Financial Resource Strain (CARDIA)    Difficulty of Paying Living Expenses: Not hard at all  Food Insecurity: No Food Insecurity (01/26/2023)   Hunger Vital Sign    Worried About Running Out of Food in the Last Year: Never true    Ran Out of Food in the Last Year: Never true  Transportation Needs: No Transportation  Needs (01/26/2023)   PRAPARE - Administrator, Civil Service (Medical): No    Lack of Transportation (Non-Medical): No  Physical Activity: Insufficiently Active (01/26/2023)   Exercise Vital Sign    Days of Exercise per Week: 3 days    Minutes of Exercise per Session: 10 min  Stress: No Stress Concern Present (01/26/2023)   [employer] of Occupational Health - Occupational Stress Questionnaire    Feeling of Stress : Only a little  Social Connections:  Socially Integrated (01/26/2023)   Social Connection and Isolation Panel [NHANES]    Frequency of Communication with Friends and Family: More than three times a week    Frequency of Social Gatherings with Friends and Family: Twice a week    Attends Religious Services: 1 to 4 times per year    Active Member of [employer] or Organizations: Yes    Attends Banker Meetings: More than 4 times per year    Marital Status: Married  Catering manager Violence: Unknown (02/05/2022)   Received from [facility]   HITS    Physically Hurt: Not on file    Insult or Talk Down To: Not on file    Threaten Physical Harm: Not on file    Scream or Curse: Not on file     Review of Systems    General:  No chills, fever, night sweats or weight changes.  Cardiovascular:  No chest pain, dyspnea on exertion, edema, orthopnea, palpitations, paroxysmal nocturnal dyspnea. Dermatological: No rash, lesions/masses Respiratory: No cough, dyspnea Urologic: No hematuria, dysuria Abdominal:   No nausea, vomiting, diarrhea, bright red blood per rectum, melena, or hematemesis Neurologic:  No visual changes, wkns, changes in mental status. All other systems reviewed and are otherwise negative except as noted above.  EKG Interpretation Date/Time:  Tuesday June 01 2023 10:28:28 EDT Ventricular Rate:  66 PR Interval:  132 QRS Duration:  80 QT Interval:  370 QTC Calculation: 387 R Axis:   68  Text Interpretation: Normal sinus rhythm Normal ECG  When compared with ECG of 29-Jul-2018 20:06, PREVIOUS ECG IS PRESENT Confirmed by Joni Reining [phone]) on 06/01/2023 10:51:18 AM    Physical Exam    VS:  BP 124/84   Pulse 66   Ht 6' (1.829 m)   Wt 164 lb (74.4 kg)   SpO2 96%   BMI 22.24 kg/m  , BMI Body mass index is 22.24 kg/m.     GEN: Well nourished, well developed, in no acute distress. HEENT: normal. Neck: Supple, no JVD, carotid bruits, or masses. Cardiac: RRR, no murmurs, rubs, or gallops. No clubbing, cyanosis, edema.  Radials/DP/PT 2+ and equal bilaterally.  Respiratory:  Respirations regular and unlabored, clear to auscultation bilaterally. GI: Soft, nontender, nondistended, BS + x 4. MS: no deformity or atrophy. Skin: warm and dry, no rash. Neuro:  Strength and sensation are intact. Psych: Normal affect.  EKG Interpretation Date/Time:  Tuesday June 01 2023 10:28:28 EDT Ventricular Rate:  66 PR Interval:  132 QRS Duration:  80 QT Interval:  370 QTC Calculation: 387 R Axis:   68  Text Interpretation: Normal sinus rhythm Normal ECG When compared with ECG of 29-Jul-2018 20:06, PREVIOUS ECG IS PRESENT Confirmed by Joni Reining [phone]) on 06/01/2023 10:51:18 AM   Lab Results  Component Value Date   WBC 6.3 02/16/2022   HGB 14.2 02/16/2022   HCT 40.6 02/16/2022   MCV 85 02/16/2022   PLT 211 02/16/2022   Lab Results  Component Value Date   CREATININE 1.10 02/16/2022   BUN 13 02/16/2022   NA 141 02/16/2022   K 4.5 02/16/2022   CL 103 02/16/2022   CO2 23 02/16/2022   Lab Results  Component Value Date   ALT 28 02/16/2022   AST 21 02/16/2022   ALKPHOS 57 02/16/2022   BILITOT 0.8 02/16/2022   Lab Results  Component Value Date   CHOL 173 01/05/2023   HDL 53.60 01/05/2023   LDLCALC 97 01/05/2023   LDLDIRECT 172.0 12/17/2016  TRIG 112.0 01/05/2023   CHOLHDL 3 01/05/2023    Lab Results  Component Value Date   HGBA1C 5.3 05/13/2020     Review of Prior Studies EKG  Interpretation Date/Time:  Tuesday June 01 2023 10:28:28 EDT Ventricular Rate:  66 PR Interval:  132 QRS Duration:  80 QT Interval:  370 QTC Calculation: 387 R Axis:   68  Text Interpretation: Normal sinus rhythm Normal ECG When compared with ECG of 29-Jul-2018 20:06, PREVIOUS ECG IS PRESENT Confirmed by Joni Reining [phone]) on 06/01/2023 10:51:18 AM    Assessment & Plan   1.  Coronary artery calcification: Patient did have a follow-up functional study which was negative for ischemia.  The patient has excellent functional capacity, no chest pain or dyspnea on exertion.  No planned cardiac testing at this time as he is completely asymptomatic and his EKG is normal.  2.  Hypercholesterolemia: Patient remains on statin therapy with rosuvastatin 40 mg daily.  Most recent labs dated 01/05/2023, total cholesterol 173, HDL 53, LDL 97.  Continue current medication regimen.  He is also has his labs checked by PCP.  3.  Palpitations: He has had no recurrence of palpitations, heart racing, or skipping.  Continue metoprolol succinate 50 mg daily.  4.  FAA evaluation: Mr. Manas has excellent functional capacity, no history of ischemia, most recent evaluation had normal LV function.  EKG today was normal without evidence of arrhythmia or new ischemic changes.  There are no contraindications for flying from a cardiac standpoint.  He takes metoprolol for symptomatic relief only from palpitations, which is not a contraindication for his ability to fly.  He should not have any restrictions as he has an excellent prognosis.        Signed, Bettey Mare. Liborio Nixon, ANP, AACC   06/01/2023 12:10 PM      Office [phone] Fax [phone]  Notice: This dictation was prepared with Dragon dictation along with smaller phrase technology. Any transcriptional errors that result from this process are unintentional and may not be corrected upon review.

## 2023-06-02 ENCOUNTER — Encounter (INDEPENDENT_AMBULATORY_CARE_PROVIDER_SITE_OTHER): Payer: Self-pay

## 2023-06-03 ENCOUNTER — Ambulatory Visit: Payer: No Typology Code available for payment source | Admitting: Adult Health

## 2023-06-09 ENCOUNTER — Encounter: Payer: Self-pay | Admitting: Cardiology

## 2023-06-09 ENCOUNTER — Telehealth: Payer: Self-pay | Admitting: Cardiology

## 2023-06-09 NOTE — Telephone Encounter (Signed)
Patient paperwork was brought and discussed at appointment.  He states it was supposed to be on My chart but has not seen it and would like status please

## 2023-06-09 NOTE — Telephone Encounter (Signed)
Pt states that his FFA paperwork was never sent over to [facility] practice [phone]. Please advise

## 2023-06-10 NOTE — Telephone Encounter (Signed)
Patient stated he needs the cardiologist to complete questions regarding his Holter Monitor results and email document to either Dr. Dartha Lodge at [email or web address] or him as soon as possible as he is under time constraint.

## 2023-06-12 ENCOUNTER — Other Ambulatory Visit: Payer: Self-pay | Admitting: Cardiology

## 2023-06-14 NOTE — Telephone Encounter (Signed)
Called patient to advise report and copy of EKG emailed to Dr. Pollyann Kennedy.

## 2023-06-18 ENCOUNTER — Telehealth: Payer: Self-pay | Admitting: Family Medicine

## 2023-06-18 NOTE — Telephone Encounter (Signed)
Pt was going to cancel his appointment for Tuesday. Pt was advised that provider is sick so will be doing vv next week. Pt does not have any current concerns and so unless Dr Abner Greenspan needs to see him, he is okay to cancel. Please advise patient if he needs appt so it can be canceled.

## 2023-06-21 NOTE — Progress Notes (Signed)
MyChart Video Visit    Virtual Visit via Video Note   This patient is at least at moderate risk for complications without adequate follow up. This format is felt to be most appropriate for this patient at this time. Physical exam was limited by quality of the video and audio technology used for the visit. Shamaine, CMA was able to get the patient set up on a video visit.  Patient location: home Patient and provider in visit Provider location: Office  I discussed the limitations of evaluation and management by telemedicine and the availability of in person appointments. The patient expressed understanding and agreed to proceed.  Visit Date: 06/22/2023  Today's healthcare provider: Danise Edge, MD     Subjective:    Patient ID: Tyler Schmidt, male    DOB: Aug 16, 1966, 57 y.o.   MRN: [id number]  No chief complaint on file.   HPI Discussed the use of AI scribe software for clinical note transcription with the patient, who gave verbal consent to proceed.  History of Present Illness            Past Medical History:  Diagnosis Date  . Adrenal adenoma, left    Incidentaloma noted on noncontrast chest CT done to f/u lung nodule seen on chest radiograph (10/2016).  . Allergy   . Anxiety 08/12/2012  . Atypical chest pain Fall 2013   Stress echo NORMAL 08/30/12  . Barrett esophagus 2012  . GERD (gastroesophageal reflux disease)   . Hyperlipidemia    HDL high, LDL high: simvastatin resulted in prolonged/severe muscle pain, led to long w/u and pt wants to avoid further statin unless lipids severely worsen  . Neck pain 08/12/2012  . Nephrolithiasis 10/2016   Bilat, nonobstruct, noted on CT chest done for f/u of pulm nodule seen on chest radiograph  . Palpitations Fall/winter 2017   Improved with lopressor  . Rheumatoid arthritis (HCC)   . Subclinical hypothyroidism    Patient give hx of hypfunctioning goiter in the distant past, says he was given pills to "kill" the  goiter.  Says bx of goiter was benign.    Past Surgical History:  Procedure Laterality Date  . 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 CARDIOVASCULAR STRESS TEST  08/2012   Stress echo normal.  Pt also reports normal ETT 09/2016.  Marland Kitchen ESOPHAGOGASTRODUODENOSCOPY  09/2011   Dr. Juanda Chance  . HOLTER MONITOR  10/2016   No pathologic arrhythmias.  Occ PVCs that occ corresponded to his palpitations.      Family History  Problem Relation Age of Onset  . Heart disease Father        Died of MI age 97, possible PE  . Heart disease Paternal Uncle        MI's in his 1s  . Heart disease Paternal Uncle   . Colon cancer Neg Hx     Social History   Socioeconomic History  . Marital status: Married    Spouse name: Not on file  . Number of children: 2  . Years of education: Not on file  . Highest education level: Associate degree: occupational, Scientist, product/process development, or vocational program  Occupational History    Employer: [employer]    Comment: Pilot  Tobacco Use  . Smoking status: Former    Current packs/day: 0.00    Types: Cigarettes    Quit date: 08/18/1979    Years since quitting: 43.8  . Smokeless tobacco:  Former    Types: Chew  . Tobacco comments:    has not used since age 56  Substance and Sexual Activity  . Alcohol use: No    Alcohol/week: 0.0 standard drinks of alcohol  . Drug use: No  . Sexual activity: Yes  Other Topics Concern  . Not on file  Social History Narrative   Married, boy/girl twins age 29yrs.   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   Originally from [place].   No T/A/Ds.   Cardiovasc exercise regularly.  Prudent diet.         Social Determinants of Health   Financial Resource Strain: Low Risk  (01/26/2023)   Overall Financial Resource Strain (CARDIA)   . Difficulty of Paying Living Expenses: Not hard at all  Food Insecurity: No Food Insecurity (01/26/2023)   Hunger Vital Sign   . Worried About Programme researcher, broadcasting/film/video in the  Last Year: Never true   . Ran Out of Food in the Last Year: Never true  Transportation Needs: No Transportation Needs (01/26/2023)   PRAPARE - Transportation   . Lack of Transportation (Medical): No   . Lack of Transportation (Non-Medical): No  Physical Activity: Insufficiently Active (01/26/2023)   Exercise Vital Sign   . Days of Exercise per Week: 3 days   . Minutes of Exercise per Session: 10 min  Stress: No Stress Concern Present (01/26/2023)   [employer] of Occupational Health - Occupational Stress Questionnaire   . Feeling of Stress : Only a little  Social Connections: Socially Integrated (01/26/2023)   Social Connection and Isolation Panel [NHANES]   . Frequency of Communication with Friends and Family: More than three times a week   . Frequency of Social Gatherings with Friends and Family: Twice a week   . Attends Religious Services: 1 to 4 times per year   . Active Member of Clubs or Organizations: Yes   . Attends Banker Meetings: More than 4 times per year   . Marital Status: Married  Catering manager Violence: Unknown (02/05/2022)   [facility]   HITS   . Physically Hurt: Not on file   . Insult or Talk Down To: Not on file   . Threaten Physical Harm: Not on file   . Scream or Curse: Not on file    Outpatient Medications Prior to Visit  Medication Sig Dispense Refill  . aspirin EC 81 MG tablet Take 1 tablet (81 mg total) by mouth daily. 90 tablet 3  . Cholecalciferol (VITAMIN D-3 PO) Take 2,000 Int'l Units/1.59m2 by mouth daily.    . hydroxychloroquine (PLAQUENIL) 200 MG tablet Take by mouth. patient take 1 tablet bid x 5 days a week    . levothyroxine (SYNTHROID) 75 MCG tablet Take 1 tablet by mouth once daily 90 tablet 0  . metoprolol succinate (TOPROL-XL) 50 MG 24 hr tablet TAKE 1 TABLET BY MOUTH ONCE DAILY WITH OR IMMEDIATELY FOLLOWING A MEAL 90 tablet 3  . Multiple Vitamins-Minerals (ZINC PO) Take 1 capsule by mouth daily.    .  Omega-3 Fatty Acids (FISH OIL) 1000 MG CAPS Take 1 capsule by mouth daily.    Marland Kitchen omeprazole (PRILOSEC) 20 MG capsule TAKE 1 CAPSULE BY MOUTH TWICE DAILY BEFORE A MEAL 180 capsule 0  . rosuvastatin (CRESTOR) 40 MG tablet Take 1 tablet (40 mg total) by mouth daily. 90 tablet 3  . TURMERIC PO Take 2,000 mg by mouth daily.    Marland Kitchen Ubiquinol 100 MG CAPS  Take 1 capsule by mouth daily.     No facility-administered medications prior to visit.    Allergies  Allergen Reactions  . Simvastatin Other (See Comments)    myalgias  . Atorvastatin Other (See Comments)    Joint pain and low back pain    Review of Systems  Constitutional:  Negative for fever and malaise/fatigue.  HENT:  Negative for congestion.   Eyes:  Negative for blurred vision.  Respiratory:  Negative for shortness of breath.   Cardiovascular:  Negative for chest pain, palpitations and leg swelling.  Gastrointestinal:  Negative for abdominal pain, blood in stool and nausea.  Genitourinary:  Negative for dysuria and frequency.  Musculoskeletal:  Negative for falls.  Skin:  Negative for rash.  Neurological:  Negative for dizziness, loss of consciousness and headaches.  Endo/Heme/Allergies:  Negative for environmental allergies.  Psychiatric/Behavioral:  Negative for depression. The patient is not nervous/anxious.       Objective:    Physical Exam Constitutional:      General: He is not in acute distress.    Appearance: Normal appearance. He is not ill-appearing or toxic-appearing.  HENT:     Head: Normocephalic and atraumatic.     Right Ear: External ear normal.     Left Ear: External ear normal.     Nose: Nose normal.  Eyes:     General:        Right eye: No discharge.        Left eye: No discharge.  Pulmonary:     Effort: Pulmonary effort is normal.  Skin:    Findings: No rash.  Neurological:     Mental Status: He is alert and oriented to person, place, and time.  Psychiatric:        Behavior: Behavior normal.    There were no vitals taken for this visit. Wt Readings from Last 3 Encounters:  06/01/23 164 lb (74.4 kg)  01/26/23 167 lb (75.8 kg)  01/05/23 166 lb (75.3 kg)       Assessment & Plan:  Hyperlipidemia, unspecified hyperlipidemia type Assessment & Plan: Encourage heart healthy diet such as MIND or DASH diet, increase exercise, avoid trans fats, simple carbohydrates and processed foods, consider a krill or fish or flaxseed oil cap daily.  Tolerating Rosuvastatin   Hypothyroidism, unspecified type Assessment & Plan: On Levothyroxine, continue to monitor    Vitamin D deficiency Assessment & Plan: Supplement and monitor    Rheumatoid arthritis involving multiple sites, unspecified whether rheumatoid factor present [facility]) Assessment & Plan: Tolerating Plaquenil and working with rheumatology      Assessment and Plan              I discussed the assessment and treatment plan with the patient. The patient was provided an opportunity to ask questions and all were answered. The patient agreed with the plan and demonstrated an understanding of the instructions.   The patient was advised to call back or seek an in-person evaluation if the symptoms worsen or if the condition fails to improve as anticipated.  Danise Edge, MD [facility] at [facility] [phone] (phone) [phone] (fax)  [facility]

## 2023-06-21 NOTE — Assessment & Plan Note (Signed)
Encourage heart healthy diet such as MIND or DASH diet, increase exercise, avoid trans fats, simple carbohydrates and processed foods, consider a krill or fish or flaxseed oil cap daily.  Tolerating Rosuvastatin 

## 2023-06-21 NOTE — Assessment & Plan Note (Signed)
On Levothyroxine, continue to monitor, current dose of medicine has his TSH at 1.72

## 2023-06-21 NOTE — Assessment & Plan Note (Signed)
Supplement and monitor 

## 2023-06-21 NOTE — Assessment & Plan Note (Signed)
Tolerating Plaquenil and working with rheumatology, Dr Sherryll Burger and will see ophthalmology soon

## 2023-06-21 NOTE — Telephone Encounter (Signed)
Called pt lvm regarding having to cancel appt for tomorrow ,and  ask pt to call our office to get scheduled for follow up appt

## 2023-06-22 ENCOUNTER — Other Ambulatory Visit: Payer: Self-pay | Admitting: Family Medicine

## 2023-06-22 ENCOUNTER — Telehealth (INDEPENDENT_AMBULATORY_CARE_PROVIDER_SITE_OTHER): Payer: No Typology Code available for payment source | Admitting: Family Medicine

## 2023-06-22 DIAGNOSIS — M069 Rheumatoid arthritis, unspecified: Secondary | ICD-10-CM | POA: Diagnosis not present

## 2023-06-22 DIAGNOSIS — E559 Vitamin D deficiency, unspecified: Secondary | ICD-10-CM

## 2023-06-22 DIAGNOSIS — E785 Hyperlipidemia, unspecified: Secondary | ICD-10-CM

## 2023-06-22 DIAGNOSIS — Z87442 Personal history of urinary calculi: Secondary | ICD-10-CM

## 2023-06-22 DIAGNOSIS — E039 Hypothyroidism, unspecified: Secondary | ICD-10-CM | POA: Diagnosis not present

## 2023-06-22 DIAGNOSIS — R002 Palpitations: Secondary | ICD-10-CM

## 2023-06-22 MED ORDER — LEVOTHYROXINE SODIUM 75 MCG PO TABS: 75.0000 ug | ORAL_TABLET | Freq: Every day | ORAL | 1 refills | Status: DC

## 2023-06-22 NOTE — Assessment & Plan Note (Signed)
No recent stones, Hydrate and monitor

## 2023-06-22 NOTE — Assessment & Plan Note (Signed)
Really has not had any episodes since his thyroid and RA have been controlled, over 2 years now. He is considering coming off of the Metoprolol and would have to monitor BP and pulse closely and titrate off if he decides to proceed he is aware and will let us know

## 2023-08-13 ENCOUNTER — Other Ambulatory Visit: Payer: Self-pay | Admitting: Cardiology

## 2023-08-13 DIAGNOSIS — E785 Hyperlipidemia, unspecified: Secondary | ICD-10-CM

## 2023-11-15 ENCOUNTER — Telehealth: Payer: Self-pay

## 2023-11-15 NOTE — Telephone Encounter (Signed)
 PA initiated via Covermymeds; KEY: [id number]. Awaiting determination.

## 2023-11-15 NOTE — Telephone Encounter (Signed)
 PA approved. this request is approved from 11/15/2023 to 11/14/2026

## 2023-11-16 ENCOUNTER — Other Ambulatory Visit: Payer: Self-pay | Admitting: Family Medicine

## 2024-03-21 ENCOUNTER — Other Ambulatory Visit: Payer: Self-pay | Admitting: Family Medicine

## 2024-04-07 ENCOUNTER — Encounter: Payer: Self-pay | Admitting: Cardiology

## 2024-04-26 ENCOUNTER — Ambulatory Visit: Attending: Cardiology

## 2024-04-26 ENCOUNTER — Telehealth: Payer: Self-pay | Admitting: Cardiology

## 2024-04-26 DIAGNOSIS — R002 Palpitations: Secondary | ICD-10-CM

## 2024-04-26 NOTE — Progress Notes (Unsigned)
 Enrolled patient for a 3 day Zio XT monitor to be mailed to patients home

## 2024-04-26 NOTE — Telephone Encounter (Signed)
 Left message for patient order has been placed and monitor will be mailed to his home.

## 2024-04-26 NOTE — Telephone Encounter (Signed)
 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 He would like to have that ordered now, he states last year they ordered 3 day zio monitor. He is scheduled to see Damien, NP 05/26/24

## 2024-04-27 ENCOUNTER — Encounter: Payer: Self-pay | Admitting: Cardiology

## 2024-05-10 ENCOUNTER — Ambulatory Visit: Admitting: Family

## 2024-05-17 ENCOUNTER — Ambulatory Visit: Admitting: Family

## 2024-05-21 NOTE — Progress Notes (Signed)
 Mr. Tyler Schmidt have mild degenerative arthritis   No RA changes  Let me know if you have questions and what Orthopedic says   Sincerely,   Hajra Zehra Shah, MD  Rheumatology

## 2024-05-25 ENCOUNTER — Ambulatory Visit (INDEPENDENT_AMBULATORY_CARE_PROVIDER_SITE_OTHER): Admitting: Family

## 2024-05-25 ENCOUNTER — Ambulatory Visit: Payer: Self-pay | Admitting: Family

## 2024-05-25 VITALS — BP 113/69 | HR 59 | Temp 98.7°F | Resp 16 | Ht 72.0 in | Wt 162.0 lb

## 2024-05-25 DIAGNOSIS — E785 Hyperlipidemia, unspecified: Secondary | ICD-10-CM

## 2024-05-25 DIAGNOSIS — K219 Gastro-esophageal reflux disease without esophagitis: Secondary | ICD-10-CM | POA: Diagnosis not present

## 2024-05-25 DIAGNOSIS — Z87442 Personal history of urinary calculi: Secondary | ICD-10-CM

## 2024-05-25 DIAGNOSIS — M069 Rheumatoid arthritis, unspecified: Secondary | ICD-10-CM

## 2024-05-25 DIAGNOSIS — E039 Hypothyroidism, unspecified: Secondary | ICD-10-CM

## 2024-05-25 DIAGNOSIS — Z125 Encounter for screening for malignant neoplasm of prostate: Secondary | ICD-10-CM | POA: Diagnosis not present

## 2024-05-25 LAB — TSH: TSH: 2.16 u[IU]/mL (ref 0.35–5.50)

## 2024-05-25 LAB — COMPREHENSIVE METABOLIC PANEL WITH GFR
ALT: 20 U/L (ref 0–53)
AST: 17 U/L (ref 0–37)
Albumin: 4.7 g/dL (ref 3.5–5.2)
Alkaline Phosphatase: 44 U/L (ref 39–117)
BUN: 15 mg/dL (ref 6–23)
CO2: 31 meq/L (ref 19–32)
Calcium: 9.3 mg/dL (ref 8.4–10.5)
Chloride: 101 meq/L (ref 96–112)
Creatinine, Ser: 1.11 mg/dL (ref 0.40–1.50)
GFR: 74.32 mL/min (ref >60.00)
Glucose, Bld: 73 mg/dL (ref 70–99)
Potassium: 4 meq/L (ref 3.5–5.1)
Sodium: 139 meq/L (ref 135–145)
Total Bilirubin: 1 mg/dL (ref 0.2–1.2)
Total Protein: 6.9 g/dL (ref 6.0–8.3)

## 2024-05-25 LAB — LIPID PANEL
Cholesterol: 153 mg/dL (ref 0–200)
HDL: 56.1 mg/dL (ref >39.00)
LDL Cholesterol: 75 mg/dL (ref 0–99)
NonHDL: 96.4
Total CHOL/HDL Ratio: 3
Triglycerides: 105 mg/dL (ref 0.0–149.0)
VLDL: 21 mg/dL (ref 0.0–40.0)

## 2024-05-25 LAB — PSA: PSA: 0.92 ng/mL (ref 0.10–4.00)

## 2024-05-25 NOTE — Assessment & Plan Note (Signed)
 Currently asymptomatic.  Would like referral to urology.

## 2024-05-25 NOTE — Assessment & Plan Note (Addendum)
 Lab Results  Component Value Date   CHOL 173 01/05/2023   HDL 53.60 01/05/2023   LDLCALC 97 01/05/2023   LDLDIRECT 172.0 12/17/2016   TRIG 112.0 01/05/2023   CHOLHDL 3 01/05/2023   Maintained on crestor .  Update lipid panel

## 2024-05-25 NOTE — Patient Instructions (Signed)
 VISIT SUMMARY:  You came in for a routine follow-up and lab work. We discussed your thyroid  medication, kidney stones, tendonitis, rheumatoid arthritis, and GERD. We also reviewed your general health maintenance and planned for your next follow-up visit.  YOUR PLAN:  THYROID  DISORDER: You are feeling well on your current thyroid  medication and need lab work for TSA requirements. -Order thyroid  function tests.  KIDNEY STONES: You recently passed a kidney stone and are concerned about FAA reporting. You retained the stone for analysis and are interested in over-the-counter prevention methods. -Refer to urologist for evaluation and stone analysis. -Emphasize the importance of staying hydrated.  TENDONITIS: You have pain in your elbow and wrist likely due to bow hunting. X-rays showed no significant issues, and it is suspected to be tendon-related. -Consider referral to orthopedic specialist if symptoms persist.  RHEUMATOID ARTHRITIS: Your rheumatoid arthritis is well-managed with Plaquenil under Dr. Orlando care, and you have had no flares. -Continue current management with Dr. Loreli.  GASTROESOPHAGEAL REFLUX DISEASE (GERD): You are taking omeprazole  effectively, but experience heartburn if you miss a dose. -Continue taking omeprazole  daily to manage heartburn.  GENERAL HEALTH MAINTENANCE: You are due for preventive measures including lab work and vaccinations. -Order cholesterol, kidney, and liver function tests. -Order PSA test. -Discuss shingles, pneumonia, and hepatitis B vaccinations at next visit. -Continue vitamin D3 and zinc supplementation.  FOLLOW-UP: We need to plan for your next visit. -Schedule follow-up visit in six months for physical examination and immunization discussion.

## 2024-05-25 NOTE — Assessment & Plan Note (Signed)
 Stable on omeprazole .  Has symptoms if he skips days.  Continue same.

## 2024-05-25 NOTE — Assessment & Plan Note (Signed)
 Clinically stable in synthroid .  Update TSH.

## 2024-05-25 NOTE — Assessment & Plan Note (Signed)
 Stable on plaquenil. Followed by Dr. Maree.  Symptoms are well managed.

## 2024-05-25 NOTE — Progress Notes (Signed)
 Subjective:     Patient ID: Tyler Schmidt, male    DOB: 1966/09/15, 58 y.o.   MRN: [id number]  Chief Complaint  Patient presents with   Hypothyroidism    HPI  Discussed the use of AI scribe software for clinical note transcription with the patient, who gave verbal consent to proceed.  History of Present Illness Tyler Schmidt is a 58 year old male who presents for a routine follow-up and lab work.  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 He feels great on his current dose of synthroid  and requests lab work for TSA requirements. He takes omeprazole  for reflux, which is effective, but experiences heartburn if he skips a dose. He has rheumatoid arthritis managed by Dr. Maree with Plaquenil and has had no recent flares. Previous wrist and elbow pain from bow hunting was evaluated with x-rays showing no significant issues, suspected to be tendon-related. He states recently passed a kidney stone in [place] , with familiar, mild pain lasting ten minutes. States he passed the stone and has saved it for evaluation and is requesting referral to Urology. He takes vitamin D3 and zinc, contributing to his overall good health.   Lab Results  Component Value Date   PSA 0.89 06/18/2021   PSA 0.68 05/13/2020      Health Maintenance Due  Topic Date Due   Pneumococcal Vaccine 65-10 Years old (1 of 2 - PCV) Never done   Hepatitis B Vaccines (1 of 3 - 19+ 3-dose series) Never done   Zoster Vaccines- Shingrix (1 of 2) Never done   COVID-19 Vaccine (2 - Janssen risk series) 10/17/2020    Past Medical History:  Diagnosis Date   Adrenal adenoma, left    Incidentaloma noted on noncontrast chest CT done to f/u lung nodule seen on chest radiograph (10/2016).   Allergy    Anxiety 08/12/2012   Atypical chest pain Fall 2013   Stress echo NORMAL 08/30/12   Barrett esophagus 2012   GERD (gastroesophageal reflux disease)    Hyperlipidemia    HDL high,  LDL high: simvastatin resulted in prolonged/severe muscle pain, led to long w/u and pt wants to avoid further statin unless lipids severely worsen   Neck pain 08/12/2012   Nephrolithiasis 10/2016   Bilat, nonobstruct, noted on CT chest done for f/u of pulm nodule seen on chest radiograph   Palpitations Fall/winter 2017   Improved with lopressor    Rheumatoid arthritis (HCC)    Subclinical hypothyroidism    Patient give hx of hypfunctioning goiter in the distant past, says he was given pills to kill the goiter.  Says bx of goiter was benign.    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  Pt also reports normal ETT 09/2016.   ESOPHAGOGASTRODUODENOSCOPY  09/2011   Dr. Obie NANNY MONITOR  10/2016   No pathologic arrhythmias.  Occ PVCs that occ corresponded to his palpitations.      Family History  Problem Relation Age of Onset   Heart disease Father        Died of MI age 26, possible PE   Heart disease Paternal Uncle        MI's in his 48s   Heart disease Paternal Uncle    Colon cancer Neg  Hx     Social History   Socioeconomic History   Marital status: Married    Spouse name: Not on file   Number of children: 2   Years of education: Not on file   Highest education level: Some college, no degree  Occupational History    Employer: [employer]    CommentBuilding services engineer  Tobacco Use   Smoking status: Former    Current packs/day: 0.00    Types: Cigarettes    Quit date: 08/18/1979    Years since quitting: 44.8   Smokeless tobacco: Former    Types: Chew   Tobacco comments:    has not used since age 30  Substance and Sexual Activity   Alcohol use: No    Alcohol/week: 0.0 standard drinks of alcohol   Drug use: No   Sexual activity: Yes  Other Topics Concern   Not on file  Social History Narrative   Married, boy/girl  twins age 37yrs.   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   Originally from [place].   No T/A/Ds.   Cardiovasc exercise regularly.  Prudent diet.         Social Drivers of Corporate investment banker Strain: Low Risk  (05/24/2024)   Overall Financial Resource Strain (CARDIA)    Difficulty of Paying Living Expenses: Not hard at all  Food Insecurity: No Food Insecurity (05/24/2024)   Hunger Vital Sign    Worried About Running Out of Food in the Last Year: Never true    Ran Out of Food in the Last Year: Never true  Transportation Needs: No Transportation Needs (05/24/2024)   PRAPARE - Administrator, Civil Service (Medical): No    Lack of Transportation (Non-Medical): No  Physical Activity: Insufficiently Active (05/24/2024)   Exercise Vital Sign    Days of Exercise per Week: 3 days    Minutes of Exercise per Session: 20 min  Stress: No Stress Concern Present (05/24/2024)   [employer] of Occupational Health - Occupational Stress Questionnaire    Feeling of Stress: Not at all  Social Connections: Socially Integrated (05/24/2024)   Social Connection and Isolation Panel    Frequency of Communication with Friends and Family: More than three times a week    Frequency of Social Gatherings with Friends and Family: Once a week    Attends Religious Services: More than 4 times per year    Active Member of [employer] or Organizations: Yes    Attends Banker Meetings: 1 to 4 times per year    Marital Status: Married  Catering manager Violence: Unknown (02/05/2022)   Received from [facility]   HITS    Physically Hurt: Not on file    Insult or Talk Down To: Not on file    Threaten Physical Harm: Not on file    Scream or Curse: Not on file    Outpatient Medications Prior to Visit  Medication Sig Dispense Refill   aspirin  EC 81 MG tablet Take 1 tablet (81 mg total) by mouth daily. 90 tablet 3   Cholecalciferol (VITAMIN D -3 PO) Take 2,000 Int'l Units/1.50m2 by mouth daily.      hydroxychloroquine (PLAQUENIL) 200 MG tablet Take by mouth. patient take 1 tablet bid x 5 days a week     levothyroxine  (SYNTHROID ) 75 MCG tablet Take 1 tablet by mouth once daily 30 tablet 0   metoprolol  succinate (TOPROL -XL) 50 MG 24 hr tablet TAKE 1 TABLET BY MOUTH ONCE DAILY WITH OR IMMEDIATELY FOLLOWING  A MEAL 90 tablet 3   Multiple Vitamins-Minerals (ZINC PO) Take 1 capsule by mouth daily.     Omega-3 Fatty Acids (FISH OIL) 1000 MG CAPS Take 1 capsule by mouth daily.     omeprazole  (PRILOSEC ) 20 MG capsule TAKE 1 CAPSULE BY MOUTH TWICE DAILY BEFORE A MEAL 180 capsule 0   rosuvastatin  (CRESTOR ) 40 MG tablet Take 1 tablet by mouth once daily 90 tablet 2   TURMERIC PO Take 2,000 mg by mouth daily.     Ubiquinol 100 MG CAPS Take 1 capsule by mouth daily.     No facility-administered medications prior to visit.    Allergies  Allergen Reactions   Simvastatin Other (See Comments)    myalgias   Atorvastatin  Other (See Comments)    Joint pain and low back pain    ROS    See HPI Objective:    Physical Exam Constitutional:      General: He is not in acute distress.    Appearance: He is well-developed.  HENT:     Head: Normocephalic and atraumatic.  Cardiovascular:     Rate and Rhythm: Normal rate and regular rhythm.     Heart sounds: No murmur heard. Pulmonary:     Effort: Pulmonary effort is normal. No respiratory distress.     Breath sounds: Normal breath sounds. No wheezing or rales.  Skin:    General: Skin is warm and dry.  Neurological:     Mental Status: He is alert and oriented to person, place, and time.  Psychiatric:        Behavior: Behavior normal.        Thought Content: Thought content normal.      BP 113/69 (BP Location: Right Arm, Patient Position: Sitting, Cuff Size: Normal)   Pulse (!) 59   Temp 98.7 F (37.1 C) (Oral)   Resp 16   Ht 6' (1.829 m)   Wt 162 lb (73.5 kg)   SpO2 100%   BMI 21.97 kg/m  Wt Readings from Last 3 Encounters:  05/25/24  162 lb (73.5 kg)  06/01/23 164 lb (74.4 kg)  01/26/23 167 lb (75.8 kg)       Assessment & Plan:   Problem List Items Addressed This Visit       Unprioritized   Rheumatoid arthritis (HCC)   Stable on plaquenil. Followed by Dr. Maree.  Symptoms are well managed.       Hypothyroid - Primary   Clinically stable in synthroid .  Update TSH.        Relevant Orders   TSH   Hyperlipidemia   Lab Results  Component Value Date   CHOL 173 01/05/2023   HDL 53.60 01/05/2023   LDLCALC 97 01/05/2023   LDLDIRECT 172.0 12/17/2016   TRIG 112.0 01/05/2023   CHOLHDL 3 01/05/2023   Maintained on crestor .  Update lipid panel       Relevant Orders   Lipid panel   Comp Met (CMET)   History of kidney stones   Currently asymptomatic.  Would like referral to urology.        Relevant Orders   Ambulatory referral to Urology   GERD (gastroesophageal reflux disease)   Stable on omeprazole .  Has symptoms if he skips days.  Continue same.       Other Visit Diagnoses       Screening for prostate cancer       Relevant Orders   PSA       I am having  Dorn Veda Simmonds maintain his Ubiquinol, TURMERIC PO, hydroxychloroquine, aspirin  EC, Fish Oil, Multiple Vitamins-Minerals (ZINC PO), Cholecalciferol (VITAMIN D -3 PO), metoprolol  succinate, rosuvastatin , levothyroxine , and omeprazole .  No orders of the defined types were placed in this encounter.

## 2024-05-26 ENCOUNTER — Ambulatory Visit: Attending: Nurse Practitioner | Admitting: Nurse Practitioner

## 2024-05-26 ENCOUNTER — Encounter: Payer: Self-pay | Admitting: Nurse Practitioner

## 2024-05-26 ENCOUNTER — Telehealth: Payer: Self-pay

## 2024-05-26 VITALS — BP 110/70 | HR 65 | Ht 72.0 in | Wt 160.4 lb

## 2024-05-26 DIAGNOSIS — E785 Hyperlipidemia, unspecified: Secondary | ICD-10-CM | POA: Diagnosis not present

## 2024-05-26 DIAGNOSIS — I2510 Atherosclerotic heart disease of native coronary artery without angina pectoris: Secondary | ICD-10-CM

## 2024-05-26 DIAGNOSIS — R002 Palpitations: Secondary | ICD-10-CM | POA: Diagnosis not present

## 2024-05-26 MED ORDER — METOPROLOL SUCCINATE ER 25 MG PO TB24: 25.0000 mg | ORAL_TABLET | Freq: Every day | ORAL | 3 refills | Status: AC

## 2024-05-26 NOTE — Progress Notes (Signed)
 Office Visit    Patient Name: Tyler Schmidt Date of Encounter: 05/26/2024  Primary Care Provider:  Domenica Harlene LABOR, MD Primary Cardiologist:  Redell Shallow, MD  Chief Complaint    58 year old male with a history of coronary artery calcification noted on CT, palpitations, hyperlipidemia, hypothyroidism, and rheumatoid arthritis.who presents for follow-up related to palpitations.  Past Medical History    Past Medical History:  Diagnosis Date   Adrenal adenoma, left    Incidentaloma noted on noncontrast chest CT done to f/u lung nodule seen on chest radiograph (10/2016).   Allergy    Anxiety 08/12/2012   Atypical chest pain Fall 2013   Stress echo NORMAL 08/30/12   Barrett esophagus 2012   GERD (gastroesophageal reflux disease)    Hyperlipidemia    HDL high, LDL high: simvastatin resulted in prolonged/severe muscle pain, led to long w/u and pt wants to avoid further statin unless lipids severely worsen   Neck pain 08/12/2012   Nephrolithiasis 10/2016   Bilat, nonobstruct, noted on CT chest done for f/u of pulm nodule seen on chest radiograph   Palpitations Fall/winter 2017   Improved with lopressor    Rheumatoid arthritis (HCC)    Subclinical hypothyroidism    Patient give hx of hypfunctioning goiter in the distant past, says he was given pills to kill the goiter.  Says bx of goiter was benign.   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  Pt also reports normal ETT 09/2016.   ESOPHAGOGASTRODUODENOSCOPY  09/2011   Dr. Obie NANNY MONITOR  10/2016   No pathologic arrhythmias.  Occ PVCs that occ corresponded to his palpitations.      Allergies  Allergies  Allergen Reactions   Simvastatin Other (See Comments)    myalgias   Atorvastatin  Other (See Comments)    Joint pain and low back pain      Labs/Other Studies Reviewed    The following studies were reviewed today:  Cardiac Studies & Procedures   ______________________________________________________________________________________________        SHERRILEE  LONG TERM MONITOR (3-14 DAYS) 05/20/2023  Narrative Patch Wear Time:  1 days and 3 hours (2024-07-13T23:34:20-0400 to 2024-07-15T03:28:24-0400)  Patient had a min HR of 49 bpm, max HR of 110 bpm, and avg HR of 69 bpm. Predominant underlying rhythm was Sinus Rhythm. Isolated SVEs were rare (<1.0%), and no SVE Couplets or SVE Triplets were present. Isolated VEs were rare (<1.0%), and no VE Couplets or VE Triplets were present.  Sinus bradycardia, NSR, sinus tachycardia, occasional PAC and PVC. Redell Shallow       ______________________________________________________________________________________________     Recent Labs: 05/25/2024: ALT 20; BUN 15; Creatinine, Ser 1.11; Potassium 4.0; Sodium 139; TSH 2.16  Recent Lipid Panel    Component Value Date/Time   CHOL 153 05/25/2024 1136   TRIG 105.0 05/25/2024 1136   HDL 56.10 05/25/2024 1136   CHOLHDL 3 05/25/2024 1136   VLDL 21.0 05/25/2024 1136   LDLCALC 75 05/25/2024 1136   LDLDIRECT 172.0 12/17/2016 1546    History of Present Illness    58 year old male with with the above past medical history including coronary artery calcification noted on CT, palpitations, hyperlipidemia, hypothyroidism, and rheumatoid arthritis.   Stress echo in 2009 was normal.  Stress echo in 2013 was normal.  Holter monitor in December  2017 showed sinus rhythm with PVCs.  Stress echo at [facility] November 2017 was normal. CT chest in December 2017 showed coronary artery calcification.  He is on metoprolol  for symptomatic relief of palpitations. Cardiac monitor in 04/2023 showed predominant normal sinus rhythm, occasional PACs and PVCs, no significant arrhythmia.  He was last seen in the office on 06/01/2023 and was stable  from a cardiac standpoint.  Repeat cardiac monitor was ordered for FAA requirements and is pending.  He presents today for follow-up.  Since his last visit he has done well from a cardiac standpoint.  He denies any palpitations, denies symptoms concerning for angina. He remains active.  Overall, he reports feeling well.   Home Medications    Current Outpatient Medications  Medication Sig Dispense Refill   aspirin  EC 81 MG tablet Take 1 tablet (81 mg total) by mouth daily. 90 tablet 3   celecoxib (CELEBREX) 100 MG capsule Take 100 mg by mouth daily.     Cholecalciferol (VITAMIN D -3 PO) Take 2,000 Int'l Units/1.56m2 by mouth daily.     hydroxychloroquine (PLAQUENIL) 200 MG tablet Take by mouth. patient take 1 tablet bid x 5 days a week     levothyroxine  (SYNTHROID ) 75 MCG tablet Take 1 tablet by mouth once daily 30 tablet 0   metoprolol  succinate (TOPROL  XL) 25 MG 24 hr tablet Take 1 tablet (25 mg total) by mouth daily. 90 tablet 3   Multiple Vitamins-Minerals (ZINC PO) Take 1 capsule by mouth daily.     Omega-3 Fatty Acids (FISH OIL) 1000 MG CAPS Take 1 capsule by mouth daily.     omeprazole  (PRILOSEC ) 20 MG capsule TAKE 1 CAPSULE BY MOUTH TWICE DAILY BEFORE A MEAL 180 capsule 0   rosuvastatin  (CRESTOR ) 40 MG tablet Take 1 tablet by mouth once daily 90 tablet 2   TURMERIC PO Take 2,000 mg by mouth daily.     Ubiquinol 100 MG CAPS Take 1 capsule by mouth daily.     No current facility-administered medications for this visit.     Review of Systems    He denies chest pain, palpitations, dyspnea, pnd, orthopnea, n, v, dizziness, syncope, edema, weight gain, or early satiety. All other systems reviewed and are otherwise negative except as noted above.   Physical Exam    VS:  BP 110/70   Pulse 65   Ht 6' (1.829 m)   Wt 160 lb 6.4 oz (72.8 kg)   SpO2 98%   BMI 21.75 kg/m   GEN: Well nourished, well developed, in no acute distress. HEENT: normal. Neck: Supple, no JVD, carotid bruits, or  masses. Cardiac: RRR, no murmurs, rubs, or gallops. No clubbing, cyanosis, edema.  Radials/DP/PT 2+ and equal bilaterally.  Respiratory:  Respirations regular and unlabored, clear to auscultation bilaterally. GI: Soft, nontender, nondistended, BS + x 4. MS: no deformity or atrophy. Skin: warm and dry, no rash. Neuro:  Strength and sensation are intact. Psych: Normal affect.  Accessory Clinical Findings    ECG personally reviewed by me today - EKG Interpretation Date/Time:  Friday May 26 2024 13:42:10 EDT Ventricular Rate:  65 PR Interval:  192 QRS Duration:  94 QT Interval:  390 QTC Calculation: 405 R Axis:   74  Text Interpretation: Normal sinus rhythm Normal ECG When compared with ECG of 01-Jun-2023 10:28, No significant change was found Confirmed by Daneen Perkins ([id number]) on 05/26/2024 1:45:58 PM  - no acute changes.   Lab Results  Component Value Date   WBC  6.3 02/16/2022   HGB 14.2 02/16/2022   HCT 40.6 02/16/2022   MCV 85 02/16/2022   PLT 211 02/16/2022   Lab Results  Component Value Date   CREATININE 1.11 05/25/2024   BUN 15 05/25/2024   NA 139 05/25/2024   K 4.0 05/25/2024   CL 101 05/25/2024   CO2 31 05/25/2024   Lab Results  Component Value Date   ALT 20 05/25/2024   AST 17 05/25/2024   ALKPHOS 44 05/25/2024   BILITOT 1.0 05/25/2024   Lab Results  Component Value Date   CHOL 153 05/25/2024   HDL 56.10 05/25/2024   LDLCALC 75 05/25/2024   LDLDIRECT 172.0 12/17/2016   TRIG 105.0 05/25/2024   CHOLHDL 3 05/25/2024    Lab Results  Component Value Date   HGBA1C 5.3 05/13/2020    Assessment & Plan   1. Coronary artery calcification: Coronary calcification noted on previous CT scan. Stable with no anginal symptoms. No indication for ischemic evaluation. Continue aspirin , Crestor .  2. Palpitations: Cardiac monitor in 04/2023 showed predominant normal sinus rhythm, occasional PACs and PVCs, no significant arrhythmia.  Pending repeat cardiac monitor for FAA  requirements, will send cardiac monitor results to FAA once available.  He denies any recent palpitations.  He is interested in possibly weaning his metoprolol . Through shared decision making, will decrease metoprolol  to 25 mg daily.  Continue to monitor symptoms.  3. Hyperlipidemia: LDL was 75 in 05/2024. Continue Crestor .  4. FAA evaluation: Patient has excellent functional capacity with no history of ischemia and normal LV function.  His electrocardiogram is normal.  There is no contraindication for flying from a cardiac standpoint.  His metoprolol  is purely for symptomatic relief from palpitations and is also not a contraindication to flying.  He has excellent prognosis and no restrictions.    5. Disposition: Follow-up in 1 year.       Damien JAYSON Braver, NP 05/26/2024, 2:14 PM

## 2024-05-26 NOTE — Telephone Encounter (Signed)
 LMOM, to see if pt is ok with seeing Damien Braver NP today. Pt's apt is at 1:55 PM today 05/26/24. Pt's appointment notes says MD only and we would like to make sure pt is ok with seeing Damien Braver NP today. If not, pt will need to be rescheduled.

## 2024-05-26 NOTE — Patient Instructions (Signed)
 Medication Instructions:  Decrease Metoprolol  25 mg daily   *If you need a refill on your cardiac medications before your next appointment, please call your pharmacy*  Lab Work: NONE ordered at this time of appointment   Testing/Procedures: NONE ordered at this time of appointment   Follow-Up: At [facility], you and your health needs are our priority.  As part of our continuing mission to provide you with exceptional heart care, our providers are all part of one team.  This team includes your primary Cardiologist (physician) and Advanced Practice Providers or APPs (Physician Assistants and Nurse Practitioners) who all work together to provide you with the care you need, when you need it.  Your next appointment:   1 year(s)  Provider:   Redell Shallow, MD    We recommend signing up for the patient portal called MyChart.  Sign up information is provided on this After Visit Summary.  MyChart is used to connect with patients for Virtual Visits (Telemedicine).  Patients are able to view lab/test results, encounter notes, upcoming appointments, etc.  Non-urgent messages can be sent to your provider as well.   To learn more about what you can do with MyChart, go to [email or web address].   Other Instructions

## 2024-05-31 ENCOUNTER — Ambulatory Visit: Payer: Self-pay | Admitting: Cardiology

## 2024-05-31 DIAGNOSIS — R002 Palpitations: Secondary | ICD-10-CM | POA: Diagnosis not present

## 2024-06-06 NOTE — Telephone Encounter (Signed)
Letter of results sent to pt  

## 2024-06-13 ENCOUNTER — Telehealth: Payer: Self-pay | Admitting: Cardiology

## 2024-06-13 NOTE — Telephone Encounter (Signed)
 Returned call to patient and let him know Adrien will be in tomorrow.  He says she knows what needs to be done.  Says they have revoked his medical and has 14 days to return it.  He left the letter from [facility] with Damien Braver in regards to what is needed to be faxed in from the monitor.  Needs it within the next few days. Fax # [phone]

## 2024-06-13 NOTE — Telephone Encounter (Signed)
 Patient stated he left his FAA paperwork at last OV regarding his heart monitor results to be completed and faxed to fax# [phone].  Patient stated he will need the FAA document faxed as soon as possible.  Patient wants a call back from [employer].

## 2024-06-13 NOTE — Telephone Encounter (Signed)
 Found letter from 06/06/24 and he needs more sent in and it is on the paperwork given to [facility].

## 2024-06-16 NOTE — Telephone Encounter (Signed)
 Left message for patient, monitor printed and placed in the mail to the address on file. He is to call if he does not want report mailed.

## 2024-06-21 ENCOUNTER — Other Ambulatory Visit: Payer: Self-pay | Admitting: Family Medicine

## 2024-06-21 NOTE — Telephone Encounter (Signed)
 Pt requesting a c/b from [place].

## 2024-06-21 NOTE — Telephone Encounter (Signed)
 Message sent to patient via MyChart  E-Faxed results

## 2024-06-21 NOTE — Telephone Encounter (Signed)
 Pt is requesting a callback to regarding him wanting nurse Adrien to fax over Holter monitor results to [facility] in [place] at [phone]. He'd like to confirm it's been done. Please advise

## 2024-06-21 NOTE — Telephone Encounter (Signed)
 See my chart message

## 2024-06-22 ENCOUNTER — Other Ambulatory Visit: Payer: Self-pay | Admitting: Family Medicine

## 2024-06-30 ENCOUNTER — Encounter: Payer: Self-pay | Admitting: Urology

## 2024-06-30 ENCOUNTER — Ambulatory Visit (HOSPITAL_BASED_OUTPATIENT_CLINIC_OR_DEPARTMENT_OTHER)
Admission: RE | Admit: 2024-06-30 | Discharge: 2024-06-30 | Disposition: A | Discharge status: Home / Self Care | Source: Ambulatory Visit | Attending: Urology | Admitting: Urology

## 2024-06-30 ENCOUNTER — Encounter (HOSPITAL_BASED_OUTPATIENT_CLINIC_OR_DEPARTMENT_OTHER): Payer: Self-pay

## 2024-06-30 ENCOUNTER — Ambulatory Visit (INDEPENDENT_AMBULATORY_CARE_PROVIDER_SITE_OTHER): Admitting: Urology

## 2024-06-30 VITALS — BP 120/80 | HR 66 | Ht 72.0 in | Wt 165.0 lb

## 2024-06-30 DIAGNOSIS — N200 Calculus of kidney: Secondary | ICD-10-CM

## 2024-06-30 LAB — URINALYSIS, ROUTINE W REFLEX MICROSCOPIC
Bilirubin, UA: NEGATIVE
Glucose, UA: NEGATIVE
Ketones, UA: NEGATIVE
Leukocytes,UA: NEGATIVE
Nitrite, UA: NEGATIVE
Protein,UA: NEGATIVE
RBC, UA: NEGATIVE
Specific Gravity, UA: 1.02 (ref 1.005–1.030)
Urobilinogen, Ur: 0.2 mg/dL (ref 0.2–1.0)
pH, UA: 5.5 (ref 5.0–7.5)

## 2024-06-30 NOTE — Progress Notes (Signed)
 Assessment: 1. Nephrolithiasis     Plan: I personally reviewed the patient's chart including provider notes, lab and imaging results. Stone sent for analysis. KUB today. Stone prevention discussed and information provided. Return to office in 1 year.  Chief Complaint:  Chief Complaint  Patient presents with   Nephrolithiasis    History of Present Illness:  Tyler Schmidt is a 58 y.o. male who is seen in consultation from Eleanor Ponto, NP for evaluation of nephrolithiasis. He has a prior history of nephrolithiasis.  CT from 10/20 showed multiple bilateral renal calculi, 4 mm left UVJ stone.  He reportedly passed the stone. KUB from 3/24 showed left renal calculi, largest 7 mm in lower pole. He passed a stone in early July 2025. No current symptoms.  No flank pain or gross hematuria.  No lower urinary tract symptoms.  PSA 7/25:  0.92  Past Medical History:  Past Medical History:  Diagnosis Date   Adrenal adenoma, left    Incidentaloma noted on noncontrast chest CT done to f/u lung nodule seen on chest radiograph (10/2016).   Allergy    Anxiety 08/12/2012   Atypical chest pain Fall 2013   Stress echo NORMAL 08/30/12   Barrett esophagus 2012   GERD (gastroesophageal reflux disease)    Hyperlipidemia    HDL high, LDL high: simvastatin resulted in prolonged/severe muscle pain, led to long w/u and pt wants to avoid further statin unless lipids severely worsen   Neck pain 08/12/2012   Nephrolithiasis 10/2016   Bilat, nonobstruct, noted on CT chest done for f/u of pulm nodule seen on chest radiograph   Palpitations Fall/winter 2017   Improved with lopressor    Rheumatoid arthritis (HCC)    Subclinical hypothyroidism    Patient give hx of hypfunctioning goiter in the distant past, says he was given pills to kill the goiter.  Says bx of goiter was benign.    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 Tisa)   CARDIOVASCULAR STRESS TEST  08/2012   Stress echo normal.  Pt also reports normal ETT 09/2016.   ESOPHAGOGASTRODUODENOSCOPY  09/2011   Dr. Obie NANNY MONITOR  10/2016   No pathologic arrhythmias.  Occ PVCs that occ corresponded to his palpitations.      Allergies:  Allergies  Allergen Reactions   Simvastatin Other (See Comments)    myalgias   Atorvastatin  Other (See Comments)    Joint pain and low back pain    Family History:  Family History  Problem Relation Age of Onset   Heart disease Father        Died of MI age 87, possible PE   Heart disease Paternal Uncle        MI's in his 46s   Heart disease Paternal Uncle    Colon cancer Neg Hx     Social History:  Social History   Tobacco Use   Smoking status: Former    Current packs/day: 0.00    Types: Cigarettes    Quit date: 08/18/1979    Years since quitting: 44.8   Smokeless tobacco: Former    Types: Chew   Tobacco comments:    has not used since age 77  Substance Use Topics   Alcohol use: No    Alcohol/week: 0.0 standard drinks of alcohol   Drug use: No    Review of  symptoms:  Constitutional:  Negative for unexplained weight loss, night sweats, fever, chills ENT:  Negative for nose bleeds, sinus pain, painful swallowing CV:  Negative for chest pain, shortness of breath, exercise intolerance, palpitations, loss of consciousness Resp:  Negative for cough, wheezing, shortness of breath GI:  Negative for nausea, vomiting, diarrhea, bloody stools GU:  Positives noted in HPI; otherwise negative for gross hematuria, dysuria, urinary incontinence Neuro:  Negative for seizures, poor balance, limb weakness, slurred speech Psych:  Negative for lack of energy, depression, anxiety Endocrine:  Negative for polydipsia, polyuria, symptoms of hypoglycemia (dizziness, hunger, sweating) Hematologic:  Negative for anemia, purpura, petechia, prolonged or  excessive bleeding, use of anticoagulants  Allergic:  Negative for difficulty breathing or choking as a result of exposure to anything; no shellfish allergy; no allergic response (rash/itch) to materials, foods  Physical exam: BP 120/80   Pulse 66   Ht 6' (1.829 m)   Wt 165 lb (74.8 kg)   BMI 22.38 kg/m  GENERAL APPEARANCE:  Well appearing, well developed, well nourished, NAD HEENT: Atraumatic, Normocephalic, oropharynx clear. NECK: Supple without lymphadenopathy or thyromegaly. LUNGS: Clear to auscultation bilaterally. HEART: Regular Rate and Rhythm without murmurs, gallops, or rubs. ABDOMEN: Soft, non-tender, No Masses. EXTREMITIES: Moves all extremities well.  Without clubbing, cyanosis, or edema. NEUROLOGIC:  Alert and oriented x 3, normal gait, CN II-XII grossly intact.  MENTAL STATUS:  Appropriate. BACK:  Non-tender to palpation.  No CVAT SKIN:  Warm, dry and intact.    Results: U/A: negative

## 2024-07-05 ENCOUNTER — Encounter: Payer: Self-pay | Admitting: Urology

## 2024-07-12 LAB — STONE ANALYSIS
Calcium Oxalate Dihydrate: 10 %
Calcium Oxalate Monohydrate: 90 %
Weight Calculi: 24 mg

## 2024-07-13 ENCOUNTER — Ambulatory Visit: Payer: Self-pay | Admitting: Urology

## 2024-09-23 ENCOUNTER — Other Ambulatory Visit: Payer: Self-pay | Admitting: Cardiology

## 2024-09-23 DIAGNOSIS — E785 Hyperlipidemia, unspecified: Secondary | ICD-10-CM

## 2025-06-29 ENCOUNTER — Ambulatory Visit: Admitting: Urology
